# Patient Record
Sex: Female | Born: 1990 | Race: White | Hispanic: No | Marital: Married | Smoking: Never smoker
Health system: Southern US, Community
[De-identification: ages and names within clinical notes are randomized; demographics above are authoritative.]

## PROBLEM LIST (undated history)

## (undated) ENCOUNTER — Inpatient Hospital Stay: Payer: Self-pay

## (undated) DIAGNOSIS — N200 Calculus of kidney: Secondary | ICD-10-CM

## (undated) DIAGNOSIS — K449 Diaphragmatic hernia without obstruction or gangrene: Secondary | ICD-10-CM

## (undated) DIAGNOSIS — R1013 Epigastric pain: Secondary | ICD-10-CM

## (undated) DIAGNOSIS — L709 Acne, unspecified: Secondary | ICD-10-CM

## (undated) DIAGNOSIS — N9089 Other specified noninflammatory disorders of vulva and perineum: Secondary | ICD-10-CM

## (undated) DIAGNOSIS — K2210 Ulcer of esophagus without bleeding: Secondary | ICD-10-CM

## (undated) DIAGNOSIS — K219 Gastro-esophageal reflux disease without esophagitis: Secondary | ICD-10-CM

## (undated) DIAGNOSIS — G43909 Migraine, unspecified, not intractable, without status migrainosus: Secondary | ICD-10-CM

## (undated) DIAGNOSIS — F419 Anxiety disorder, unspecified: Secondary | ICD-10-CM

## (undated) DIAGNOSIS — R0789 Other chest pain: Secondary | ICD-10-CM

## (undated) DIAGNOSIS — Z87442 Personal history of urinary calculi: Secondary | ICD-10-CM

## (undated) HISTORY — PX: OTHER SURGICAL HISTORY: SHX169

## (undated) HISTORY — DX: Ulcer of esophagus without bleeding: K22.10

## (undated) HISTORY — DX: Acne, unspecified: L70.9

## (undated) HISTORY — DX: Other chest pain: R07.89

## (undated) HISTORY — DX: Anxiety disorder, unspecified: F41.9

## (undated) HISTORY — DX: Calculus of kidney: N20.0

## (undated) HISTORY — DX: Epigastric pain: R10.13

## (undated) HISTORY — DX: Other specified noninflammatory disorders of vulva and perineum: N90.89

## (undated) HISTORY — DX: Diaphragmatic hernia without obstruction or gangrene: K44.9

---

## 2014-06-01 ENCOUNTER — Ambulatory Visit: Payer: Self-pay | Admitting: Internal Medicine

## 2014-06-02 ENCOUNTER — Ambulatory Visit: Payer: Self-pay | Admitting: Internal Medicine

## 2014-06-02 LAB — CBC WITH DIFFERENTIAL/PLATELET
Basophil #: 0 10*3/uL (ref 0.0–0.1)
Basophil %: 0.7 %
Eosinophil #: 0.1 10*3/uL (ref 0.0–0.7)
Eosinophil %: 1 %
HCT: 40.6 % (ref 35.0–47.0)
HGB: 13.6 g/dL (ref 12.0–16.0)
Lymphocyte #: 1.7 10*3/uL (ref 1.0–3.6)
Lymphocyte %: 29.8 %
MCH: 29.1 pg (ref 26.0–34.0)
MCHC: 33.6 g/dL (ref 32.0–36.0)
MCV: 87 fL (ref 80–100)
Monocyte #: 0.3 x10 3/mm (ref 0.2–0.9)
Monocyte %: 4.5 %
Neutrophil #: 3.7 10*3/uL (ref 1.4–6.5)
Neutrophil %: 64 %
Platelet: 211 10*3/uL (ref 150–440)
RBC: 4.69 10*6/uL (ref 3.80–5.20)
RDW: 12.3 % (ref 11.5–14.5)
WBC: 5.8 10*3/uL (ref 3.6–11.0)

## 2014-06-02 LAB — URINALYSIS, COMPLETE
Bilirubin,UR: NEGATIVE
Glucose,UR: NEGATIVE
Leukocyte Esterase: NEGATIVE
Nitrite: NEGATIVE
Ph: 6 (ref 5.0–8.0)
Protein: NEGATIVE
Specific Gravity: >=1.03 (ref 1.000–1.030)

## 2014-06-02 LAB — COMPREHENSIVE METABOLIC PANEL
Albumin: 3.4 g/dL (ref 3.4–5.0)
Alkaline Phosphatase: 71 U/L
Anion Gap: 10 (ref 7–16)
BUN: 12 mg/dL (ref 7–18)
Bilirubin,Total: 0.4 mg/dL (ref 0.2–1.0)
Calcium, Total: 9.1 mg/dL (ref 8.5–10.1)
Chloride: 103 mmol/L (ref 98–107)
Co2: 25 mmol/L (ref 21–32)
Creatinine: 0.63 mg/dL (ref 0.60–1.30)
EGFR (African American): >60
EGFR (Non-African Amer.): >60
Glucose: 102 mg/dL — ABNORMAL HIGH (ref 65–99)
Osmolality: 276 (ref 275–301)
Potassium: 3.8 mmol/L (ref 3.5–5.1)
SGOT(AST): 16 U/L (ref 15–37)
SGPT (ALT): 17 U/L
Sodium: 138 mmol/L (ref 136–145)
Total Protein: 7.7 g/dL (ref 6.4–8.2)

## 2014-06-03 ENCOUNTER — Emergency Department: Payer: Self-pay | Admitting: Emergency Medicine

## 2014-06-03 LAB — CBC WITH DIFFERENTIAL/PLATELET
Basophil #: 0 10*3/uL (ref 0.0–0.1)
Basophil %: 0.3 %
Eosinophil #: 0.1 10*3/uL (ref 0.0–0.7)
Eosinophil %: 0.6 %
HCT: 41.4 % (ref 35.0–47.0)
HGB: 13.7 g/dL (ref 12.0–16.0)
Lymphocyte #: 2.3 10*3/uL (ref 1.0–3.6)
Lymphocyte %: 28.8 %
MCH: 29.4 pg (ref 26.0–34.0)
MCHC: 33.2 g/dL (ref 32.0–36.0)
MCV: 89 fL (ref 80–100)
Monocyte #: 0.4 x10 3/mm (ref 0.2–0.9)
Monocyte %: 5 %
Neutrophil #: 5.1 10*3/uL (ref 1.4–6.5)
Neutrophil %: 65.3 %
Platelet: 232 10*3/uL (ref 150–440)
RBC: 4.66 10*6/uL (ref 3.80–5.20)
RDW: 12.5 % (ref 11.5–14.5)
WBC: 7.9 10*3/uL (ref 3.6–11.0)

## 2014-06-03 LAB — COMPREHENSIVE METABOLIC PANEL
Albumin: 3.4 g/dL (ref 3.4–5.0)
Alkaline Phosphatase: 73 U/L
Anion Gap: 4 — ABNORMAL LOW (ref 7–16)
BUN: 12 mg/dL (ref 7–18)
Bilirubin,Total: 0.3 mg/dL (ref 0.2–1.0)
Calcium, Total: 8.6 mg/dL (ref 8.5–10.1)
Chloride: 107 mmol/L (ref 98–107)
Co2: 29 mmol/L (ref 21–32)
Creatinine: 0.66 mg/dL (ref 0.60–1.30)
EGFR (African American): >60
EGFR (Non-African Amer.): >60
Glucose: 105 mg/dL — ABNORMAL HIGH (ref 65–99)
Osmolality: 280 (ref 275–301)
Potassium: 3.8 mmol/L (ref 3.5–5.1)
SGOT(AST): 25 U/L (ref 15–37)
SGPT (ALT): 22 U/L
Sodium: 140 mmol/L (ref 136–145)
Total Protein: 7.7 g/dL (ref 6.4–8.2)

## 2014-06-03 LAB — URINALYSIS, COMPLETE
Bilirubin,UR: NEGATIVE
Glucose,UR: NEGATIVE mg/dL (ref 0–75)
Leukocyte Esterase: NEGATIVE
Nitrite: NEGATIVE
Ph: 5 (ref 4.5–8.0)
Protein: NEGATIVE
RBC,UR: 6 /HPF (ref 0–5)
Specific Gravity: 1.023 (ref 1.003–1.030)
Squamous Epithelial: 7
WBC UR: NONE SEEN /HPF (ref 0–5)

## 2014-06-04 LAB — URINE CULTURE

## 2014-10-17 ENCOUNTER — Other Ambulatory Visit: Payer: Self-pay | Admitting: Oncology

## 2014-11-06 ENCOUNTER — Encounter: Payer: Self-pay | Admitting: Obstetrics and Gynecology

## 2014-12-04 ENCOUNTER — Telehealth: Payer: Self-pay | Admitting: Obstetrics and Gynecology

## 2014-12-04 NOTE — Telephone Encounter (Signed)
PT CALLED AND WOULD LIKE A CALL BACK SHE HAS SOME CONCERNS ABOUT EHR PERIOD.

## 2014-12-06 NOTE — Telephone Encounter (Signed)
Returned call- no menses since May 24th, has stopped OCPs as of April; some cramping last night, negative UPT 6 days ago; trying for pregnancy.  Instructed to do UPT in 4 days if no menses and let me know results. Will bring in for blood test.

## 2014-12-11 ENCOUNTER — Other Ambulatory Visit: Payer: 59

## 2014-12-11 ENCOUNTER — Other Ambulatory Visit: Payer: Self-pay | Admitting: *Deleted

## 2014-12-11 DIAGNOSIS — N926 Irregular menstruation, unspecified: Secondary | ICD-10-CM

## 2014-12-13 ENCOUNTER — Telehealth: Payer: Self-pay | Admitting: Obstetrics and Gynecology

## 2014-12-13 LAB — BETA HCG QUANT (REF LAB): hCG Quant: <1 m[IU]/mL

## 2014-12-13 LAB — SPECIMEN STATUS REPORT

## 2014-12-13 NOTE — Telephone Encounter (Signed)
Pt notified that bhcg results were negative. Will wait to hear what her next step is next week as in [facility] is out of office until Tuesday.

## 2014-12-13 NOTE — Telephone Encounter (Signed)
Pt called yesterday and her labs were not back and I didn't see that they are back today so far, is there any way we can check thru [facility], pt is very anxious about wanting to know the results.

## 2014-12-17 ENCOUNTER — Other Ambulatory Visit: Payer: Self-pay | Admitting: Obstetrics and Gynecology

## 2014-12-17 DIAGNOSIS — N926 Irregular menstruation, unspecified: Secondary | ICD-10-CM

## 2014-12-17 MED ORDER — MEDROXYPROGESTERONE ACETATE 10 MG PO TABS: 10.0000 mg | ORAL_TABLET | Freq: Every day | ORAL | Status: DC

## 2014-12-17 NOTE — Telephone Encounter (Signed)
pls advise

## 2014-12-17 NOTE — Telephone Encounter (Signed)
Please let her know i put order in for pelvic ultrasound- want to look at her uterus and ovaries to see if looks like ovulation is occuring, would like to do ultrasound within next 2 weeks, and to see me afterwards, also sent ina prescription to try to start period again- to take 1 pill at bedtime for 10 days.

## 2014-12-17 NOTE — Telephone Encounter (Signed)
i put the order in already- do yu see it?

## 2014-12-17 NOTE — Telephone Encounter (Signed)
Hey mel put order in can you make appt and let me know Thanks a bunch

## 2014-12-18 NOTE — Telephone Encounter (Signed)
appt was made for [place] and medication was faxed to [facility]

## 2015-01-01 ENCOUNTER — Encounter: Payer: Self-pay | Admitting: Obstetrics and Gynecology

## 2015-01-01 ENCOUNTER — Ambulatory Visit: Payer: 59

## 2015-01-01 ENCOUNTER — Ambulatory Visit (INDEPENDENT_AMBULATORY_CARE_PROVIDER_SITE_OTHER): Payer: 59 | Admitting: Obstetrics and Gynecology

## 2015-01-01 VITALS — BP 126/70 | HR 78 | Ht 67.0 in | Wt 155.8 lb

## 2015-01-01 DIAGNOSIS — N926 Irregular menstruation, unspecified: Secondary | ICD-10-CM

## 2015-01-01 NOTE — Progress Notes (Signed)
Patient ID: Evelyn Caldwell, female   DOB: [birth year], 24 y.o.   MRN: [id number] S; here to review ultrasound for missed menses after stopping OCPs x 3 months; reports menses started after taking provera with LMP 12/25/14. Previous menses 10/22/14.  O: A&O x4. Well groomed female in no distress Pelvic ultrasound shows normal uterus and ovaries- see scan in computer.  A: Irregular menses, 3 months off OCPs  P; will continue with expectant management, and will notify me if no menses by day 35 with negative UPT. To continue PNV as they desire pregnancy.  RTC prn or in 6 months if not pregnanct bu then.  Evelyn Caldwell Evelyn Caldwell, CNM

## 2015-01-24 ENCOUNTER — Ambulatory Visit (INDEPENDENT_AMBULATORY_CARE_PROVIDER_SITE_OTHER): Payer: 59 | Admitting: Family Medicine

## 2015-01-24 ENCOUNTER — Encounter: Payer: Self-pay | Admitting: Family Medicine

## 2015-01-24 VITALS — BP 134/72 | HR 102 | Temp 98.3°F | Resp 14 | Wt 155.0 lb

## 2015-01-24 DIAGNOSIS — R1011 Right upper quadrant pain: Secondary | ICD-10-CM

## 2015-01-24 DIAGNOSIS — G43909 Migraine, unspecified, not intractable, without status migrainosus: Secondary | ICD-10-CM | POA: Insufficient documentation

## 2015-01-24 HISTORY — DX: Migraine, unspecified, not intractable, without status migrainosus: G43.909

## 2015-01-24 MED ORDER — HYDROCODONE-ACETAMINOPHEN 5-325 MG PO TABS: ORAL_TABLET | ORAL | Status: DC

## 2015-01-24 NOTE — Patient Instructions (Signed)
Avoid fatty meals and take pain medication as needed. If you develop worsening pain over the weekend or high fever report to the ER.

## 2015-01-24 NOTE — Progress Notes (Signed)
Subjective:     Patient ID: Evelyn Caldwell, female   DOB: Dec 22, 1990, 24 y.o.   MRN: [id number]  HPI  Chief Complaint  Patient presents with  . Fatigue    patient states she developed lower back pain on Tuesday August 23rd and then started to feel achy all over and progressed later in the day with developing right pain. Pain is located now in RUQ and top abdomen, worse at night with laying down. Nothing makes it feel better. Nausea has been present. No fever, vomiting, or bloating that she could tell. She has had some chills. Patient states her and her husband are trying to have a baby but so far as of this Monday AUgust 22nd test was negative.  Reports chills and mild worsening of symptoms after eating. Minimal appetite over the last few days.   Review of Systems  Constitutional: Positive for chills.  Gastrointestinal:       Formed stool yesterday, diarrhea today.  Genitourinary: Negative for dysuria.       Has had recent gyn evaluation with negative pregnancy test. She reports she also had a negative home pregnancy test at onset of sx.       Objective:   Physical Exam  Constitutional: She appears well-developed and well-nourished. No distress.  Pulmonary/Chest: Breath sounds normal.  Abdominal: Soft. Bowel sounds are normal. There is tenderness (right upper quadrant to epigastric area. Negative Murphy's sign).       Assessment:    1. Right upper quadrant pain - CBC with Differential/Platelet - Comprehensive metabolic panel - Lipase - US Abdomen Complete; Future - HYDROcodone-acetaminophen (NORCO/VICODIN) 5-325 MG per tablet; One every 4-6 hours as needed for pain  Dispense: 28 tablet; Refill: 0    Plan:    Further f/u pending lab and u/s results.

## 2015-01-25 LAB — CBC WITH DIFFERENTIAL/PLATELET
Basophils Absolute: 0 10*3/uL (ref 0.0–0.2)
Basos: 0 %
EOS (ABSOLUTE): 0 10*3/uL (ref 0.0–0.4)
Eos: 0 %
Hematocrit: 40.3 % (ref 34.0–46.6)
Hemoglobin: 14 g/dL (ref 11.1–15.9)
Immature Grans (Abs): 0 10*3/uL (ref 0.0–0.1)
Immature Granulocytes: 0 %
Lymphocytes Absolute: 1.1 10*3/uL (ref 0.7–3.1)
Lymphs: 30 %
MCH: 29 pg (ref 26.6–33.0)
MCHC: 34.7 g/dL (ref 31.5–35.7)
MCV: 83 fL (ref 79–97)
Monocytes Absolute: 0.3 10*3/uL (ref 0.1–0.9)
Monocytes: 9 %
Neutrophils Absolute: 2.1 10*3/uL (ref 1.4–7.0)
Neutrophils: 61 %
Platelets: 154 10*3/uL (ref 150–379)
RBC: 4.83 x10E6/uL (ref 3.77–5.28)
RDW: 13.3 % (ref 12.3–15.4)
WBC: 3.5 10*3/uL (ref 3.4–10.8)

## 2015-01-25 LAB — COMPREHENSIVE METABOLIC PANEL
ALT: 24 IU/L (ref 0–32)
AST: 23 IU/L (ref 0–40)
Albumin/Globulin Ratio: 1.6 (ref 1.1–2.5)
Albumin: 4.1 g/dL (ref 3.5–5.5)
Alkaline Phosphatase: 78 IU/L (ref 39–117)
BUN/Creatinine Ratio: 22 — ABNORMAL HIGH (ref 8–20)
BUN: 13 mg/dL (ref 6–20)
Bilirubin Total: 0.4 mg/dL (ref 0.0–1.2)
CO2: 22 mmol/L (ref 18–29)
Calcium: 8.7 mg/dL (ref 8.7–10.2)
Chloride: 101 mmol/L (ref 97–108)
Creatinine, Ser: 0.58 mg/dL (ref 0.57–1.00)
GFR calc Af Amer: 148 mL/min/{1.73_m2} (ref >59)
GFR calc non Af Amer: 129 mL/min/{1.73_m2} (ref >59)
Globulin, Total: 2.6 g/dL (ref 1.5–4.5)
Glucose: 95 mg/dL (ref 65–99)
Potassium: 3.9 mmol/L (ref 3.5–5.2)
Sodium: 139 mmol/L (ref 134–144)
Total Protein: 6.7 g/dL (ref 6.0–8.5)

## 2015-01-25 LAB — LIPASE: Lipase: 26 U/L (ref 0–59)

## 2015-01-28 ENCOUNTER — Telehealth: Payer: Self-pay

## 2015-01-28 ENCOUNTER — Ambulatory Visit (HOSPITAL_COMMUNITY)
Admission: RE | Admit: 2015-01-28 | Discharge: 2015-01-28 | Disposition: A | Discharge status: Home / Self Care | Payer: 59 | Source: Ambulatory Visit | Attending: Family Medicine | Admitting: Family Medicine

## 2015-01-28 DIAGNOSIS — R110 Nausea: Secondary | ICD-10-CM | POA: Diagnosis not present

## 2015-01-28 DIAGNOSIS — R1013 Epigastric pain: Secondary | ICD-10-CM | POA: Insufficient documentation

## 2015-01-28 DIAGNOSIS — R1011 Right upper quadrant pain: Secondary | ICD-10-CM | POA: Insufficient documentation

## 2015-01-28 NOTE — Telephone Encounter (Signed)
Patient has been advised, she reports that she is feeling better. KW

## 2015-01-28 NOTE — Telephone Encounter (Signed)
-----   Message from Carmon Ginsberg, [place] sent at 01/28/2015 10:42 AM EDT ----- Normal ultrasound. If your are better do not need to do further evaluation.

## 2015-01-28 NOTE — Telephone Encounter (Signed)
[id number] 

## 2015-01-31 ENCOUNTER — Ambulatory Visit: Payer: Self-pay

## 2015-04-10 ENCOUNTER — Telehealth: Payer: Self-pay | Admitting: Obstetrics and Gynecology

## 2015-04-10 NOTE — Telephone Encounter (Signed)
Patient called. She stated she has still not started her period and has taken 2 pregnancy tests both negative.

## 2015-04-11 NOTE — Telephone Encounter (Signed)
Called pt she hasnt had a period since 03/04/15, she wanted to know what to do??? UPT x 2 neg

## 2015-04-11 NOTE — Telephone Encounter (Signed)
Is she having any other s/s of pregnancy? Have her come in for blood test

## 2015-04-14 NOTE — Telephone Encounter (Signed)
Left detailed message for pt 

## 2015-05-06 ENCOUNTER — Other Ambulatory Visit: Payer: Self-pay | Admitting: Obstetrics and Gynecology

## 2015-05-06 ENCOUNTER — Telehealth: Payer: Self-pay | Admitting: Obstetrics and Gynecology

## 2015-05-06 MED ORDER — CLOMIPHENE CITRATE 50 MG PO TABS: 50.0000 mg | ORAL_TABLET | Freq: Every day | ORAL | Status: DC

## 2015-05-06 NOTE — Telephone Encounter (Signed)
Notified pt she voiced understanding 

## 2015-05-06 NOTE — Telephone Encounter (Signed)
PT WAS HERE A WHILE BACK AND SHE WAS TOLD THAT IF SHE WANTED TO START THE CLOMIDE TO CALL IN AND SHE IS READY. PHARMACY [facility].

## 2015-05-06 NOTE — Telephone Encounter (Signed)
Please let her know I sent it in, to take 1st tablet on day 5 of next cycle through day 9.  One daily at bedtime.

## 2015-06-01 NOTE — L&D Delivery Note (Signed)
Delivery Note At  1646 a viable and healthy female "Evelyn Caldwell" was delivered via  (Presentation:LOA ;  ).  APGAR:7, 9  .   Placenta status: delivered intact with 3 vessel  Cord:  with the following complications: fetal decels in second stage- unknown etiology, vacuum applied at outlet with 2 pop-offs and four pulls total.  Anesthesia:  epidural Episiotomy:  none Lacerations:  2nd degree Suture Repair: 3.0 vicryl rapide Est. Blood Loss (mL):  350  Mom to postpartum.  Baby to Couplet care / Skin to Skin.  Whitnee Orzel [employer], CNM 03/09/2016, 5:10 PM

## 2015-07-03 ENCOUNTER — Encounter: Payer: Self-pay | Admitting: Obstetrics and Gynecology

## 2015-07-03 ENCOUNTER — Ambulatory Visit (INDEPENDENT_AMBULATORY_CARE_PROVIDER_SITE_OTHER): Payer: PRIVATE HEALTH INSURANCE | Admitting: Obstetrics and Gynecology

## 2015-07-03 VITALS — BP 135/70 | HR 107 | Wt 174.8 lb

## 2015-07-03 DIAGNOSIS — N926 Irregular menstruation, unspecified: Secondary | ICD-10-CM | POA: Diagnosis not present

## 2015-07-03 NOTE — Progress Notes (Signed)
Patient ID: Evelyn Caldwell, female   DOB: 03/04/1991, 25 y.o.   MRN: [id number] Here for pregnancy confirmation, happy about clomid achieved pregnancy.  UPT+ here.  Will RTC in 2 weeks for viability scan and nurse intake.  Quant Bhcg obtained today.  Jo-Ann Johanning [place], CNM

## 2015-07-04 LAB — BETA HCG QUANT (REF LAB): hCG Quant: 4362 m[IU]/mL

## 2015-07-11 ENCOUNTER — Ambulatory Visit (INDEPENDENT_AMBULATORY_CARE_PROVIDER_SITE_OTHER): Payer: PRIVATE HEALTH INSURANCE

## 2015-07-11 DIAGNOSIS — N926 Irregular menstruation, unspecified: Secondary | ICD-10-CM

## 2015-07-29 ENCOUNTER — Ambulatory Visit (INDEPENDENT_AMBULATORY_CARE_PROVIDER_SITE_OTHER): Payer: PRIVATE HEALTH INSURANCE | Admitting: Obstetrics and Gynecology

## 2015-07-29 VITALS — BP 128/66 | HR 81 | Wt 177.1 lb

## 2015-07-29 DIAGNOSIS — R638 Other symptoms and signs concerning food and fluid intake: Secondary | ICD-10-CM

## 2015-07-29 DIAGNOSIS — Z3490 Encounter for supervision of normal pregnancy, unspecified, unspecified trimester: Secondary | ICD-10-CM

## 2015-07-29 DIAGNOSIS — Z113 Encounter for screening for infections with a predominantly sexual mode of transmission: Secondary | ICD-10-CM

## 2015-07-29 DIAGNOSIS — Z1389 Encounter for screening for other disorder: Secondary | ICD-10-CM

## 2015-07-29 DIAGNOSIS — Z369 Encounter for antenatal screening, unspecified: Secondary | ICD-10-CM

## 2015-07-29 DIAGNOSIS — Z36 Encounter for antenatal screening of mother: Secondary | ICD-10-CM

## 2015-07-29 DIAGNOSIS — Z331 Pregnant state, incidental: Secondary | ICD-10-CM

## 2015-07-29 NOTE — Progress Notes (Signed)
Evelyn Caldwell presents for [employer] nurse interview visit. G-1.  P-0. Pregnancy education material explained and given. No cats in the home. NOB labs ordered. TSH/HbgA1c due to Increased BMI.  HIV labs and Drug screen were explained optional and she could opt out of tests but did not decline. Drug screen ordered. PNV encouraged. NT to discuss with provider. Pt. To follow up with provider in 3 weeks for NOB physical.  All questions answered.   ZIKA EXPOSURE SCREEN:  The patient has not traveled to a [place] Virus endemic area within the past 6 months, nor has she had unprotected sex with a partner who has travelled to a [place] endemic region within the past 6 months. The patient has been advised to notify us if these factors change any time during this current pregnancy, so adequate testing and monitoring can be initiated.

## 2015-07-30 LAB — URINALYSIS, ROUTINE W REFLEX MICROSCOPIC
Bilirubin, UA: NEGATIVE
Glucose, UA: NEGATIVE
Ketones, UA: NEGATIVE
Leukocytes, UA: NEGATIVE
Nitrite, UA: NEGATIVE
Protein, UA: NEGATIVE
Specific Gravity, UA: 1.029 (ref 1.005–1.030)
Urobilinogen, Ur: 0.2 mg/dL (ref 0.2–1.0)
pH, UA: 6 (ref 5.0–7.5)

## 2015-07-30 LAB — PAIN MGT SCRN (14 DRUGS), UR
Amphetamine Screen, Ur: NEGATIVE ng/mL
Barbiturate Screen, Ur: NEGATIVE ng/mL
Benzodiazepine Screen, Urine: NEGATIVE ng/mL
Buprenorphine, Urine: NEGATIVE ng/mL
Cannabinoids Ur Ql Scn: NEGATIVE ng/mL
Cocaine(Metab.)Screen, Urine: NEGATIVE ng/mL
Creatinine(Crt), U: 148.3 mg/dL (ref 20.0–300.0)
Fentanyl, Urine: NEGATIVE pg/mL
Meperidine Screen, Urine: NEGATIVE ng/mL
Methadone Scn, Ur: NEGATIVE ng/mL
Opiate Scrn, Ur: NEGATIVE ng/mL
Oxycodone+Oxymorphone Ur Ql Scn: NEGATIVE ng/mL
PCP Scrn, Ur: NEGATIVE ng/mL
Ph of Urine: 5.5 (ref 4.5–8.9)
Propoxyphene, Screen: NEGATIVE ng/mL
Tramadol Ur Ql Scn: NEGATIVE ng/mL

## 2015-07-30 LAB — MICROSCOPIC EXAMINATION
Casts: NONE SEEN /lpf
WBC, UA: NONE SEEN /hpf (ref 0–?)

## 2015-07-30 LAB — CBC WITH DIFFERENTIAL/PLATELET
Basophils Absolute: 0 10*3/uL (ref 0.0–0.2)
Basos: 0 %
EOS (ABSOLUTE): 0.1 10*3/uL (ref 0.0–0.4)
Eos: 1 %
Hematocrit: 34.7 % (ref 34.0–46.6)
Hemoglobin: 11.8 g/dL (ref 11.1–15.9)
Immature Grans (Abs): 0 10*3/uL (ref 0.0–0.1)
Immature Granulocytes: 0 %
Lymphocytes Absolute: 1.9 10*3/uL (ref 0.7–3.1)
Lymphs: 22 %
MCH: 28.4 pg (ref 26.6–33.0)
MCHC: 34 g/dL (ref 31.5–35.7)
MCV: 83 fL (ref 79–97)
Monocytes Absolute: 0.5 10*3/uL (ref 0.1–0.9)
Monocytes: 6 %
Neutrophils Absolute: 6 10*3/uL (ref 1.4–7.0)
Neutrophils: 71 %
Platelets: 243 10*3/uL (ref 150–379)
RBC: 4.16 x10E6/uL (ref 3.77–5.28)
RDW: 13.9 % (ref 12.3–15.4)
WBC: 8.4 10*3/uL (ref 3.4–10.8)

## 2015-07-30 LAB — VARICELLA ZOSTER ANTIBODY, IGM: Varicella IgM: <0.91 index (ref 0.00–0.90)

## 2015-07-30 LAB — GC/CHLAMYDIA PROBE AMP
Chlamydia trachomatis, NAA: NEGATIVE
Neisseria gonorrhoeae by PCR: NEGATIVE

## 2015-07-30 LAB — HEMOGLOBIN A1C
Est. average glucose Bld gHb Est-mCnc: 97 mg/dL
Hgb A1c MFr Bld: 5 % (ref 4.8–5.6)

## 2015-07-30 LAB — NICOTINE SCREEN, URINE: Cotinine Ql Scrn, Ur: NEGATIVE ng/mL

## 2015-07-30 LAB — RUBELLA ANTIBODY, IGM: Rubella IgM: <20 AU/mL (ref 0.0–19.9)

## 2015-07-30 LAB — RH TYPE: Rh Factor: POSITIVE

## 2015-07-30 LAB — RPR: RPR Ser Ql: NONREACTIVE

## 2015-07-30 LAB — ABO

## 2015-07-30 LAB — TSH: TSH: 1.73 u[IU]/mL (ref 0.450–4.500)

## 2015-07-30 LAB — ANTIBODY SCREEN: Antibody Screen: NEGATIVE

## 2015-07-30 LAB — HEPATITIS B SURFACE ANTIGEN: Hepatitis B Surface Ag: NEGATIVE

## 2015-07-30 LAB — HIV ANTIBODY (ROUTINE TESTING W REFLEX): HIV Screen 4th Generation wRfx: NONREACTIVE

## 2015-07-31 ENCOUNTER — Other Ambulatory Visit: Payer: Self-pay | Admitting: Obstetrics and Gynecology

## 2015-07-31 DIAGNOSIS — Z283 Underimmunization status: Secondary | ICD-10-CM

## 2015-07-31 DIAGNOSIS — O9989 Other specified diseases and conditions complicating pregnancy, childbirth and the puerperium: Principal | ICD-10-CM

## 2015-07-31 DIAGNOSIS — O99891 Other specified diseases and conditions complicating pregnancy: Secondary | ICD-10-CM

## 2015-07-31 DIAGNOSIS — Z2839 Other underimmunization status: Secondary | ICD-10-CM

## 2015-07-31 DIAGNOSIS — O09899 Supervision of other high risk pregnancies, unspecified trimester: Secondary | ICD-10-CM | POA: Insufficient documentation

## 2015-07-31 LAB — CULTURE, OB URINE

## 2015-07-31 LAB — URINE CULTURE, OB REFLEX

## 2015-08-08 ENCOUNTER — Other Ambulatory Visit: Payer: Self-pay | Admitting: Obstetrics and Gynecology

## 2015-08-08 DIAGNOSIS — O3680X0 Pregnancy with inconclusive fetal viability, not applicable or unspecified: Secondary | ICD-10-CM

## 2015-08-13 ENCOUNTER — Other Ambulatory Visit: Payer: Self-pay | Admitting: Obstetrics and Gynecology

## 2015-08-13 ENCOUNTER — Ambulatory Visit (INDEPENDENT_AMBULATORY_CARE_PROVIDER_SITE_OTHER): Payer: PRIVATE HEALTH INSURANCE

## 2015-08-13 DIAGNOSIS — O3680X0 Pregnancy with inconclusive fetal viability, not applicable or unspecified: Secondary | ICD-10-CM

## 2015-08-13 DIAGNOSIS — Z3682 Encounter for antenatal screening for nuchal translucency: Secondary | ICD-10-CM

## 2015-08-13 DIAGNOSIS — O283 Abnormal ultrasonic finding on antenatal screening of mother: Secondary | ICD-10-CM

## 2015-08-19 ENCOUNTER — Encounter: Payer: Self-pay | Admitting: Obstetrics and Gynecology

## 2015-08-19 LAB — FIRST TRIMESTER SCREEN W/NT
CRL: 47.7 mm
DIA MoM: 1.05
DIA Value: 280.2 pg/mL
Gest Age-Collect: 11.6 weeks
Maternal Age At EDD: 26.3 years
Nuchal Translucency MoM: 0.76
Nuchal Translucency: 1 mm
Number of Fetuses: 1
PAPP-A MoM: 0.97
PAPP-A Value: 521.7 ng/mL
PDF: 0
Test Results:: NEGATIVE
Weight: 177 [lb_av]
hCG MoM: 1.16
hCG Value: 112.9 IU/mL

## 2015-08-29 ENCOUNTER — Encounter: Payer: PRIVATE HEALTH INSURANCE | Admitting: Obstetrics and Gynecology

## 2015-09-03 ENCOUNTER — Encounter: Payer: Self-pay | Admitting: Obstetrics and Gynecology

## 2015-09-03 ENCOUNTER — Ambulatory Visit (INDEPENDENT_AMBULATORY_CARE_PROVIDER_SITE_OTHER): Payer: PRIVATE HEALTH INSURANCE | Admitting: Obstetrics and Gynecology

## 2015-09-03 VITALS — BP 111/64 | HR 93 | Wt 176.1 lb

## 2015-09-03 DIAGNOSIS — Z2839 Other underimmunization status: Secondary | ICD-10-CM

## 2015-09-03 DIAGNOSIS — O99891 Other specified diseases and conditions complicating pregnancy: Secondary | ICD-10-CM

## 2015-09-03 DIAGNOSIS — Z789 Other specified health status: Secondary | ICD-10-CM

## 2015-09-03 DIAGNOSIS — Z331 Pregnant state, incidental: Secondary | ICD-10-CM

## 2015-09-03 DIAGNOSIS — O09899 Supervision of other high risk pregnancies, unspecified trimester: Secondary | ICD-10-CM

## 2015-09-03 DIAGNOSIS — O9989 Other specified diseases and conditions complicating pregnancy, childbirth and the puerperium: Secondary | ICD-10-CM

## 2015-09-03 DIAGNOSIS — Z283 Underimmunization status: Secondary | ICD-10-CM

## 2015-09-03 LAB — POCT URINALYSIS DIPSTICK
Bilirubin, UA: NEGATIVE
Blood, UA: NEGATIVE
Glucose, UA: NEGATIVE
Ketones, UA: NEGATIVE
Leukocytes, UA: NEGATIVE
Nitrite, UA: NEGATIVE
Spec Grav, UA: 1.01
Urobilinogen, UA: 0.2
pH, UA: 7.5

## 2015-09-03 NOTE — Progress Notes (Signed)
NEW OB HISTORY AND PHYSICAL  SUBJECTIVE:       Evelyn Caldwell is a 25 y.o. G1P0 female, Patient's last menstrual period was 05/25/2015 (exact date)., Estimated Date of Delivery: 02/29/16, [redacted]w[redacted]d, presents today for establishment of Prenatal Care. She has no unusual complaints and complains of headache      Gynecologic History Patient's last menstrual period was 05/25/2015 (exact date). Normal Contraception: none Last Pap: 2016. Results were: normal  Obstetric History OB History  Gravida Para Term Preterm AB SAB TAB Ectopic Multiple Living  1             # Outcome Date GA Lbr Len/2nd Weight Sex Delivery Anes PTL Lv  1 Current               Past Medical History  Diagnosis Date  . Vulvar lesion     History reviewed. No pertinent past surgical history.  Current Outpatient Prescriptions on File Prior to Visit  Medication Sig Dispense Refill  . Prenatal Vit-Fe Fumarate-FA (PRENATAL MULTIVITAMIN) TABS tablet Take 1 tablet by mouth daily at 12 noon.     No current facility-administered medications on file prior to visit.    No Known Allergies  Social History   Social History  . Marital Status: Married    Spouse Name: N/A  . Number of Children: N/A  . Years of Education: N/A   Occupational History  . Not on file.   Social History Main Topics  . Smoking status: Never Smoker   . Smokeless tobacco: Never Used  . Alcohol Use: Yes     Comment: occasionally  . Drug Use: No  . Sexual Activity: Yes    Birth Control/ Protection: None   Other Topics Concern  . Not on file   Social History Narrative    Family History  Problem Relation Age of Onset  . Congestive Heart Failure Maternal Grandfather   . Parkinson's disease Paternal Grandmother     The following portions of the patient's history were reviewed and updated as appropriate: allergies, current medications, past OB history, past medical history, past surgical history, past family history, past social  history, and problem list.    OBJECTIVE: Initial Physical Exam (New OB)  GENERAL APPEARANCE: alert, well appearing, in no apparent distress, oriented to person, place and time HEAD: normocephalic, atraumatic MOUTH: mucous membranes moist, pharynx normal without lesions THYROID: no thyromegaly or masses present BREASTS: not examined LUNGS: clear to auscultation, no wheezes, rales or rhonchi, symmetric air entry HEART: regular rate and rhythm, no murmurs ABDOMEN: soft, nontender, nondistended, no abnormal masses, no epigastric pain, fundus not palpable and FHT present EXTREMITIES: no redness or tenderness in the calves or thighs SKIN: normal coloration and turgor, no rashes LYMPH NODES: no adenopathy palpable NEUROLOGIC: alert, oriented, normal speech, no focal findings or movement disorder noted  PELVIC EXAM not examined  ASSESSMENT: Normal pregnancy Seasonal allergies- OK to add claritin prn  PLAN: Prenatal care See orders

## 2015-09-03 NOTE — Progress Notes (Signed)
NOB physical- c/o fatigue, no other symptons

## 2015-09-03 NOTE — Patient Instructions (Signed)

## 2015-10-01 ENCOUNTER — Encounter: Payer: Self-pay | Admitting: Obstetrics and Gynecology

## 2015-10-01 ENCOUNTER — Ambulatory Visit (INDEPENDENT_AMBULATORY_CARE_PROVIDER_SITE_OTHER): Payer: PRIVATE HEALTH INSURANCE | Admitting: Obstetrics and Gynecology

## 2015-10-01 VITALS — BP 142/66 | HR 90 | Wt 178.9 lb

## 2015-10-01 DIAGNOSIS — Z331 Pregnant state, incidental: Secondary | ICD-10-CM

## 2015-10-01 LAB — POCT URINALYSIS DIPSTICK
Bilirubin, UA: NEGATIVE
Blood, UA: NEGATIVE
Glucose, UA: NEGATIVE
Ketones, UA: NEGATIVE
Leukocytes, UA: NEGATIVE
Nitrite, UA: NEGATIVE
Protein, UA: NEGATIVE
Spec Grav, UA: 1.015
Urobilinogen, UA: 0.2
pH, UA: 6

## 2015-10-01 NOTE — Progress Notes (Signed)
ROB- pt is doing well, she is having some "clear fluid coming out of her belly button"

## 2015-10-01 NOTE — Progress Notes (Signed)
ROB- collection of skin and hair in belly button- cleaned out- no signs of infection- anatomy scan planned for next week

## 2015-10-09 ENCOUNTER — Other Ambulatory Visit: Payer: PRIVATE HEALTH INSURANCE

## 2015-10-09 ENCOUNTER — Ambulatory Visit: Payer: PRIVATE HEALTH INSURANCE | Admitting: Obstetrics and Gynecology

## 2015-10-10 ENCOUNTER — Ambulatory Visit (INDEPENDENT_AMBULATORY_CARE_PROVIDER_SITE_OTHER): Payer: PRIVATE HEALTH INSURANCE

## 2015-10-10 DIAGNOSIS — Z3402 Encounter for supervision of normal first pregnancy, second trimester: Secondary | ICD-10-CM | POA: Diagnosis not present

## 2015-10-10 DIAGNOSIS — Z331 Pregnant state, incidental: Secondary | ICD-10-CM

## 2015-10-14 ENCOUNTER — Telehealth: Payer: Self-pay | Admitting: Obstetrics and Gynecology

## 2015-10-14 NOTE — Telephone Encounter (Signed)
[redacted] WKS PREGNANT/ PT HAVING PAIN ON RIGHT SIDE, CONSISTENT. WORSE WHEN WALKING

## 2015-10-14 NOTE — Telephone Encounter (Signed)
Left pt detailed message.

## 2015-10-17 ENCOUNTER — Encounter: Payer: Self-pay | Admitting: Obstetrics and Gynecology

## 2015-10-22 ENCOUNTER — Inpatient Hospital Stay: Payer: PRIVATE HEALTH INSURANCE

## 2015-10-22 ENCOUNTER — Encounter: Payer: Self-pay | Admitting: *Deleted

## 2015-10-22 ENCOUNTER — Observation Stay
Admission: EM | Admit: 2015-10-22 | Discharge: 2015-10-22 | Disposition: A | Discharge status: Home / Self Care | Payer: PRIVATE HEALTH INSURANCE | Attending: Obstetrics and Gynecology | Admitting: Obstetrics and Gynecology

## 2015-10-22 DIAGNOSIS — O26832 Pregnancy related renal disease, second trimester: Secondary | ICD-10-CM | POA: Diagnosis not present

## 2015-10-22 DIAGNOSIS — N132 Hydronephrosis with renal and ureteral calculous obstruction: Secondary | ICD-10-CM | POA: Diagnosis not present

## 2015-10-22 DIAGNOSIS — O09899 Supervision of other high risk pregnancies, unspecified trimester: Secondary | ICD-10-CM

## 2015-10-22 DIAGNOSIS — Z2839 Other underimmunization status: Secondary | ICD-10-CM

## 2015-10-22 DIAGNOSIS — N200 Calculus of kidney: Secondary | ICD-10-CM

## 2015-10-22 DIAGNOSIS — O9989 Other specified diseases and conditions complicating pregnancy, childbirth and the puerperium: Secondary | ICD-10-CM | POA: Diagnosis present

## 2015-10-22 DIAGNOSIS — Z283 Underimmunization status: Secondary | ICD-10-CM

## 2015-10-22 DIAGNOSIS — Z3A21 21 weeks gestation of pregnancy: Secondary | ICD-10-CM | POA: Insufficient documentation

## 2015-10-22 DIAGNOSIS — R10A0 Flank pain, unspecified side: Secondary | ICD-10-CM | POA: Diagnosis present

## 2015-10-22 DIAGNOSIS — O99891 Other specified diseases and conditions complicating pregnancy: Secondary | ICD-10-CM

## 2015-10-22 DIAGNOSIS — Z87442 Personal history of urinary calculi: Secondary | ICD-10-CM

## 2015-10-22 DIAGNOSIS — Z3490 Encounter for supervision of normal pregnancy, unspecified, unspecified trimester: Secondary | ICD-10-CM

## 2015-10-22 DIAGNOSIS — R109 Unspecified abdominal pain: Secondary | ICD-10-CM

## 2015-10-22 HISTORY — DX: Calculus of kidney: N20.0

## 2015-10-22 LAB — URINALYSIS COMPLETE WITH MICROSCOPIC (ARMC ONLY)
Bacteria, UA: NONE SEEN
Bilirubin Urine: NEGATIVE
Glucose, UA: 50 mg/dL — AB
Ketones, ur: NEGATIVE mg/dL
Leukocytes, UA: NEGATIVE
Nitrite: NEGATIVE
Protein, ur: NEGATIVE mg/dL
Specific Gravity, Urine: 1.024 (ref 1.005–1.030)
pH: 5 (ref 5.0–8.0)

## 2015-10-22 MED ORDER — OXYCODONE-ACETAMINOPHEN 5-325 MG PO TABS: 1.0000 | ORAL_TABLET | ORAL | Status: DC | PRN

## 2015-10-22 NOTE — Progress Notes (Signed)
Pt. Transported via WC to [place] for renal US via radiology tech.

## 2015-10-22 NOTE — Progress Notes (Signed)
Pt. Returned from radiology to room OBS #1.

## 2015-10-22 NOTE — Final Progress Note (Signed)
L&D OB Triage Note  SUBJECTIVE Evelyn Caldwell is a 25 y.o. G1P0 female at [redacted]w[redacted]d, EDD Estimated Date of Delivery: 02/29/16 who presented to triage with complaints of acute rt flank pain. No UTI symptoms. No Fever, nausea, vomiting.    OBJECTIVE Nursing Evaluation: BP 111/59 mmHg  Pulse 79  Temp(Src) 98.1 F (36.7 C) (Oral)  Resp 16  Ht 5\' 8"  (1.727 m)  Wt 178 lb (80.74 kg)  BMI 27.07 kg/m2  LMP 05/25/2015 (Exact Date) findings significant for nephrolithiasis, non obstructing: UA 2+Heme Kidney U/S - Mild hydronephrosis on rt; Bilateral Ureteral jets seen. Findings unchanged from prior CT scan 1 year ago. No CVAT.    NST INTERPRETATION: Indications: Flank paion  Mode: Doppler Baseline Rate (A): 144 bpm (LRQ)           Contraction Frequency (min): none  ASSESSMENT Impression:  1. Pregnancy:  G1P0 at [redacted]w[redacted]d , EDD Estimated Date of Delivery: 02/29/16 2.  Normal FHR tracing for [redacted] weeks gestation 3. Hematuria and Renal U/S suspicious for Non obstructive nephrolithiasis  PLAN 1. Reassurance given 2. Increase PO hydration 3. Percocet 5/325, #20, NORF 3. Continue routine prenatal care as scheduled   Brayton Mars, MD

## 2015-10-22 NOTE — OB Triage Note (Signed)
Pt. Present with right side abd. Pain radiating to mid back; pain started this morning at 11:00 a.m. While pt. Was eating.

## 2015-10-22 NOTE — Discharge Instructions (Signed)
Discharge instructions reviewed with patient, pt. signed and verbalized understanding. Roxicet prescription given, pt. Verbalized understanding.

## 2015-10-28 ENCOUNTER — Ambulatory Visit (INDEPENDENT_AMBULATORY_CARE_PROVIDER_SITE_OTHER): Payer: PRIVATE HEALTH INSURANCE | Admitting: Obstetrics and Gynecology

## 2015-10-28 ENCOUNTER — Encounter: Payer: Self-pay | Admitting: Obstetrics and Gynecology

## 2015-10-28 VITALS — BP 97/61 | HR 89 | Wt 177.6 lb

## 2015-10-28 DIAGNOSIS — Z3492 Encounter for supervision of normal pregnancy, unspecified, second trimester: Secondary | ICD-10-CM

## 2015-10-28 LAB — POCT URINALYSIS DIPSTICK
Bilirubin, UA: NEGATIVE
Color, UA: NEGATIVE
Glucose, UA: NEGATIVE
Ketones, UA: NEGATIVE
Leukocytes, UA: NEGATIVE
Nitrite, UA: NEGATIVE
Protein, UA: NEGATIVE
Spec Grav, UA: 1.02
Urobilinogen, UA: 0.2
pH, UA: 6

## 2015-10-28 NOTE — Progress Notes (Signed)
ROB- pt denies any complaints

## 2015-10-28 NOTE — Progress Notes (Signed)
ROB- doing well, no concerns, discussed enrolling in [facility], [place], and [place]

## 2015-11-25 ENCOUNTER — Ambulatory Visit (INDEPENDENT_AMBULATORY_CARE_PROVIDER_SITE_OTHER): Payer: PRIVATE HEALTH INSURANCE | Admitting: Obstetrics and Gynecology

## 2015-11-25 ENCOUNTER — Encounter: Payer: Self-pay | Admitting: Obstetrics and Gynecology

## 2015-11-25 VITALS — BP 116/80 | HR 91 | Wt 182.7 lb

## 2015-11-25 DIAGNOSIS — Z3493 Encounter for supervision of normal pregnancy, unspecified, third trimester: Secondary | ICD-10-CM

## 2015-11-25 LAB — POCT URINALYSIS DIPSTICK
Bilirubin, UA: NEGATIVE
Glucose, UA: NEGATIVE
Ketones, UA: NEGATIVE
Nitrite, UA: NEGATIVE
Protein, UA: NEGATIVE
Spec Grav, UA: 1.02
Urobilinogen, UA: 0.2
pH, UA: 6

## 2015-11-25 NOTE — Progress Notes (Signed)
ROB-doing well, spotting possible kidney stone or UTI- urine sent for culture;  Glucola next visit

## 2015-11-25 NOTE — Progress Notes (Signed)
ROB- pt is doing well, had episode of some dark brown spotting, some blood noted on UA today

## 2015-11-26 ENCOUNTER — Other Ambulatory Visit: Payer: Self-pay | Admitting: Obstetrics and Gynecology

## 2015-11-26 LAB — URINE CULTURE

## 2015-11-26 MED ORDER — NITROFURANTOIN MONOHYD MACRO 100 MG PO CAPS: 100.0000 mg | ORAL_CAPSULE | Freq: Two times a day (BID) | ORAL | Status: DC

## 2015-12-05 ENCOUNTER — Encounter: Payer: PRIVATE HEALTH INSURANCE | Admitting: Obstetrics and Gynecology

## 2015-12-16 ENCOUNTER — Ambulatory Visit (INDEPENDENT_AMBULATORY_CARE_PROVIDER_SITE_OTHER): Payer: PRIVATE HEALTH INSURANCE | Admitting: Obstetrics and Gynecology

## 2015-12-16 ENCOUNTER — Other Ambulatory Visit: Payer: PRIVATE HEALTH INSURANCE

## 2015-12-16 ENCOUNTER — Encounter: Payer: Self-pay | Admitting: Obstetrics and Gynecology

## 2015-12-16 VITALS — BP 120/63 | HR 92 | Wt 184.4 lb

## 2015-12-16 DIAGNOSIS — Z131 Encounter for screening for diabetes mellitus: Secondary | ICD-10-CM

## 2015-12-16 DIAGNOSIS — Z3493 Encounter for supervision of normal pregnancy, unspecified, third trimester: Secondary | ICD-10-CM

## 2015-12-16 DIAGNOSIS — Z23 Encounter for immunization: Secondary | ICD-10-CM

## 2015-12-16 LAB — POCT URINALYSIS DIPSTICK
Bilirubin, UA: NEGATIVE
Glucose, UA: NEGATIVE
Ketones, UA: NEGATIVE
Nitrite, UA: NEGATIVE
Spec Grav, UA: 1.015
Urobilinogen, UA: 0.2
pH, UA: 6.5

## 2015-12-16 MED ORDER — TETANUS-DIPHTH-ACELL PERTUSSIS 5-2.5-18.5 LF-MCG/0.5 IM SUSP
0.5000 mL | Freq: Once | INTRAMUSCULAR | Status: AC
  Administered 2015-12-16: 0.5 mL via INTRAMUSCULAR

## 2015-12-16 NOTE — Progress Notes (Signed)
ROB- glucola done, urine sent for culture. Discussed cord blood donation.

## 2015-12-16 NOTE — Progress Notes (Signed)
ROB- glucola done, blood consent signed, tdap given Pt denies any sx, did see some blood on UA dip

## 2015-12-17 ENCOUNTER — Encounter: Payer: Self-pay | Admitting: Obstetrics and Gynecology

## 2015-12-17 ENCOUNTER — Other Ambulatory Visit: Payer: Self-pay | Admitting: Obstetrics and Gynecology

## 2015-12-17 DIAGNOSIS — O99019 Anemia complicating pregnancy, unspecified trimester: Secondary | ICD-10-CM

## 2015-12-17 LAB — HEMOGLOBIN AND HEMATOCRIT, BLOOD
Hematocrit: 28.4 % — ABNORMAL LOW (ref 34.0–46.6)
Hemoglobin: 9.4 g/dL — ABNORMAL LOW (ref 11.1–15.9)

## 2015-12-17 LAB — URINE CULTURE

## 2015-12-17 LAB — GLUCOSE, 1 HOUR GESTATIONAL: Gestational Diabetes Screen: 116 mg/dL (ref 65–139)

## 2015-12-17 MED ORDER — AMOXICILLIN 500 MG PO CAPS: 500.0000 mg | ORAL_CAPSULE | Freq: Three times a day (TID) | ORAL | Status: DC

## 2015-12-17 MED ORDER — FUSION PLUS PO CAPS: 1.0000 | ORAL_CAPSULE | Freq: Every day | ORAL | Status: DC

## 2015-12-18 ENCOUNTER — Telehealth: Payer: Self-pay | Admitting: *Deleted

## 2015-12-18 NOTE — Telephone Encounter (Signed)
Changed pts pharmacy

## 2015-12-19 ENCOUNTER — Encounter: Payer: PRIVATE HEALTH INSURANCE | Admitting: Obstetrics and Gynecology

## 2015-12-26 ENCOUNTER — Encounter: Payer: Self-pay | Admitting: Family Medicine

## 2015-12-30 ENCOUNTER — Ambulatory Visit (INDEPENDENT_AMBULATORY_CARE_PROVIDER_SITE_OTHER): Payer: PRIVATE HEALTH INSURANCE | Admitting: Obstetrics and Gynecology

## 2015-12-30 VITALS — BP 127/66 | HR 66 | Wt 182.8 lb

## 2015-12-30 DIAGNOSIS — Z3493 Encounter for supervision of normal pregnancy, unspecified, third trimester: Secondary | ICD-10-CM

## 2015-12-30 LAB — POCT URINALYSIS DIPSTICK
Bilirubin, UA: NEGATIVE
Glucose, UA: NEGATIVE
Ketones, UA: NEGATIVE
Nitrite, UA: NEGATIVE
Spec Grav, UA: 1.01
Urobilinogen, UA: 0.2
pH, UA: 7

## 2015-12-30 NOTE — Progress Notes (Signed)
ROB-doing well, starts breast feeding classes next week

## 2015-12-30 NOTE — Progress Notes (Signed)
ROB- pt denies any complaints

## 2016-01-06 ENCOUNTER — Other Ambulatory Visit: Payer: Self-pay | Admitting: Obstetrics and Gynecology

## 2016-01-06 ENCOUNTER — Encounter: Payer: PRIVATE HEALTH INSURANCE | Admitting: Obstetrics and Gynecology

## 2016-01-06 ENCOUNTER — Ambulatory Visit
Admission: RE | Admit: 2016-01-06 | Discharge: 2016-01-06 | Disposition: A | Discharge status: Home / Self Care | Payer: PRIVATE HEALTH INSURANCE | Source: Ambulatory Visit | Attending: Obstetrics and Gynecology | Admitting: Obstetrics and Gynecology

## 2016-01-06 ENCOUNTER — Ambulatory Visit (INDEPENDENT_AMBULATORY_CARE_PROVIDER_SITE_OTHER): Payer: PRIVATE HEALTH INSURANCE | Admitting: Obstetrics and Gynecology

## 2016-01-06 VITALS — BP 129/74 | HR 92 | Temp 97.8°F | Wt 186.1 lb

## 2016-01-06 DIAGNOSIS — N1330 Unspecified hydronephrosis: Secondary | ICD-10-CM | POA: Diagnosis not present

## 2016-01-06 DIAGNOSIS — O4703 False labor before 37 completed weeks of gestation, third trimester: Secondary | ICD-10-CM

## 2016-01-06 DIAGNOSIS — R309 Painful micturition, unspecified: Secondary | ICD-10-CM | POA: Diagnosis not present

## 2016-01-06 DIAGNOSIS — R350 Frequency of micturition: Secondary | ICD-10-CM | POA: Diagnosis not present

## 2016-01-06 DIAGNOSIS — N1339 Other hydronephrosis: Secondary | ICD-10-CM | POA: Insufficient documentation

## 2016-01-06 LAB — POCT URINALYSIS DIPSTICK
Bilirubin, UA: NEGATIVE
Glucose, UA: NEGATIVE
Ketones, UA: NEGATIVE
Nitrite, UA: NEGATIVE
Spec Grav, UA: 1.02
Urobilinogen, UA: NEGATIVE
pH, UA: 6

## 2016-01-06 LAB — FETAL FIBRONECTIN: Fetal Fibronectin: NEGATIVE

## 2016-01-06 MED ORDER — BETAMETHASONE SOD PHOS & ACET 6 (3-3) MG/ML IJ SUSP
12.0000 mg | Freq: Once | INTRAMUSCULAR | Status: AC
  Administered 2016-01-06: 12 mg via INTRAMUSCULAR

## 2016-01-06 NOTE — Patient Instructions (Signed)

## 2016-01-06 NOTE — Progress Notes (Signed)
[facility]ble OB- reports irregular contractions allnight, with RT upper back pain, amber colored urine and increased frequency all night, worse this morning, couldn't sleep; h/o renal stones in past. FFN obtained and PTL discussed, urine sent for culture, first betamethasone given and will return tomorrow for second dose; PTL precautions discussed.

## 2016-01-06 NOTE — Progress Notes (Signed)
Pt walks in c/o urinary frequency, lower back and lower abdominal pain. Urinalysis: abnormal: large blood, trace protein, leuks trace, dark yellow in color and cloudy. NONSTRESS TEST INTERPRETATION  INDICATIONS: preterm contractions  FHR baseline: 130-150 RESULTS: reactive COMMENTS:      PLAN: 1. Continue fetal kick counts twice a day. 2. Continue antepartum testing as scheduled-Biweekly 3. RTO as scheduled.  Martie Lee, LPN

## 2016-01-07 ENCOUNTER — Ambulatory Visit (INDEPENDENT_AMBULATORY_CARE_PROVIDER_SITE_OTHER): Payer: PRIVATE HEALTH INSURANCE | Admitting: Obstetrics and Gynecology

## 2016-01-07 VITALS — BP 121/77 | HR 90 | Wt 184.3 lb

## 2016-01-07 DIAGNOSIS — N1330 Unspecified hydronephrosis: Secondary | ICD-10-CM

## 2016-01-07 DIAGNOSIS — O4703 False labor before 37 completed weeks of gestation, third trimester: Secondary | ICD-10-CM | POA: Diagnosis not present

## 2016-01-07 DIAGNOSIS — O26833 Pregnancy related renal disease, third trimester: Secondary | ICD-10-CM

## 2016-01-07 MED ORDER — BETAMETHASONE SOD PHOS & ACET 6 (3-3) MG/ML IJ SUSP
12.0000 mg | Freq: Once | INTRAMUSCULAR | Status: AC
  Administered 2016-01-07: 12 mg via INTRAMUSCULAR

## 2016-01-07 NOTE — Progress Notes (Signed)
Pt. In office today for 2nd Betamethasone injection. Pt uses heating pad to back, renal ultrasound positive for kidney stone and one in May in still there. Has small amt of spotting, better than yesterday. Baby active today, less active last night. No contractions. Pt aware to contact office for any increase in vaginal bleeding or if having contractions. To have a urologist consult. Pt aware.

## 2016-01-08 LAB — CULTURE, OB URINE

## 2016-01-08 LAB — URINE CULTURE, OB REFLEX

## 2016-01-13 ENCOUNTER — Ambulatory Visit (INDEPENDENT_AMBULATORY_CARE_PROVIDER_SITE_OTHER): Payer: PRIVATE HEALTH INSURANCE | Admitting: Obstetrics and Gynecology

## 2016-01-13 VITALS — BP 126/65 | HR 106 | Wt 184.2 lb

## 2016-01-13 DIAGNOSIS — Z3493 Encounter for supervision of normal pregnancy, unspecified, third trimester: Secondary | ICD-10-CM

## 2016-01-13 LAB — POCT URINALYSIS DIPSTICK
Bilirubin, UA: NEGATIVE
Glucose, UA: NEGATIVE
Ketones, UA: NEGATIVE
Nitrite, UA: NEGATIVE
Spec Grav, UA: 1.01
Urobilinogen, UA: 0.2
pH, UA: 7

## 2016-01-13 NOTE — Progress Notes (Signed)
ROB-has urology appt the 28th, spouse for labor support.

## 2016-01-13 NOTE — Progress Notes (Signed)
ROB- pt is doing well, she is hurting in her back, not sure if baby or kidney stone

## 2016-01-20 ENCOUNTER — Encounter: Payer: Self-pay | Admitting: Obstetrics and Gynecology

## 2016-01-26 ENCOUNTER — Ambulatory Visit (INDEPENDENT_AMBULATORY_CARE_PROVIDER_SITE_OTHER): Payer: PRIVATE HEALTH INSURANCE | Admitting: Urology

## 2016-01-26 ENCOUNTER — Encounter: Payer: Self-pay | Admitting: Urology

## 2016-01-26 VITALS — BP 127/74 | HR 105 | Ht 67.0 in | Wt 184.0 lb

## 2016-01-26 DIAGNOSIS — N1330 Unspecified hydronephrosis: Secondary | ICD-10-CM | POA: Diagnosis not present

## 2016-01-26 LAB — URINALYSIS, COMPLETE
Bilirubin, UA: NEGATIVE
Glucose, UA: NEGATIVE
Ketones, UA: NEGATIVE
Nitrite, UA: NEGATIVE
Specific Gravity, UA: >1.03 — ABNORMAL HIGH (ref 1.005–1.030)
Urobilinogen, Ur: 1 mg/dL (ref 0.2–1.0)
pH, UA: 6 (ref 5.0–7.5)

## 2016-01-26 LAB — MICROSCOPIC EXAMINATION: Epithelial Cells (non renal): >10 /hpf — AB (ref 0–10)

## 2016-01-26 NOTE — Progress Notes (Signed)
01/26/2016 12:21 PM   Evelyn Caldwell [facility] 08/24/1990 [id number]  Referring provider: Margarita Rana, MD [address] [place], [place] [address]  Chief Complaint  Patient presents with  . New Patient (Initial Visit)    Hydronephrosis    HPI: 25 year old female who was referred for right-sided hydronephrosis and intermittent right-sided flank pain. The patient is [redacted] weeks pregnant. She presented to the emergency room with pain and nausea and vomiting. An ultrasound was performed demonstrating right hydronephrosis with echogenic shadow in the lower pole consistent with a 14 mm stone. There was a leak right-sided ureteral jet noted in the bladder.  Since the patient has been discharged from the emergency room she's not had any significant or severe pain as that which brought the emergency department. She denies any flank pain. She has chills. She denies any dysuria or gross hematuria. Next  The patient had a CT scan in January 2016 for similar symptoms. She was noted to have no stones within the very tract hydronephrosis.     PMH: Past Medical History:  Diagnosis Date  . Kidney stone   . Vulvar lesion     Surgical History: Past Surgical History:  Procedure Laterality Date  . none      Home Medications:    Medication List       Accurate as of 01/26/16 12:21 PM. Always use your most recent med list.          FUSION PLUS Caps Take 1 capsule by mouth daily.   prenatal multivitamin Tabs tablet Take 1 tablet by mouth daily at 12 noon.       Allergies: No Known Allergies  Family History: Family History  Problem Relation Age of Onset  . Congestive Heart Failure Maternal Grandfather   . Parkinson's disease Paternal Grandmother   . Kidney failure Cousin   . Kidney cancer Neg Hx   . Prostate cancer Neg Hx     Social History:  reports that she has never smoked. She has never used smokeless tobacco. She reports that she does not drink alcohol or use  drugs.  ROS: UROLOGY Frequent Urination?: No Hard to postpone urination?: No Burning/pain with urination?: No Get up at night to urinate?: Yes Leakage of urine?: No Urine stream starts and stops?: No Trouble starting stream?: No Do you have to strain to urinate?: No Blood in urine?: Yes Urinary tract infection?: Yes Sexually transmitted disease?: No Injury to kidneys or bladder?: No Painful intercourse?: No Weak stream?: No Currently pregnant?: Yes Vaginal bleeding?: No  Gastrointestinal Nausea?: No Vomiting?: No Indigestion/heartburn?: Yes Diarrhea?: No Constipation?: No  Constitutional Fever: No Night sweats?: No Weight loss?: No Fatigue?: No  Skin Skin rash/lesions?: No Itching?: No  Eyes Blurred vision?: No Double vision?: No  Ears/Nose/Throat Sore throat?: No Sinus problems?: No  Hematologic/Lymphatic Swollen glands?: No Easy bruising?: No  Cardiovascular Leg swelling?: No Chest pain?: No  Respiratory Cough?: No Shortness of breath?: No  Endocrine Excessive thirst?: No  Musculoskeletal Back pain?: Yes Joint pain?: No  Neurological Headaches?: Yes Dizziness?: No  Psychologic Depression?: No Anxiety?: No  Physical Exam: BP 127/74   Pulse (!) 105   Ht 5\' 7"  (1.702 m)   Wt 83.5 kg (184 lb)   LMP 05/25/2015 (Exact Date)   BMI 28.82 kg/m   Constitutional:  Alert and oriented, No acute distress. HEENT: [place] AT, moist mucus membranes.  Trachea midline, no masses. Cardiovascular: No clubbing, cyanosis, or edema. Respiratory: Normal respiratory effort, no increased work  of breathing. GI: Abdomen is soft, nontender, nondistended, no abdominal masses GU: No CVA tenderness. Gravid uterus Skin: No rashes, bruises or suspicious lesions. Lymph: No cervical or inguinal adenopathy. Neurologic: Grossly intact, no focal deficits, moving all 4 extremities. Psychiatric: Normal mood and affect.  Laboratory Data: Lab Results  Component Value  Date   WBC 8.4 07/29/2015   HGB 13.7 06/03/2014   HCT 28.4 (L) 12/16/2015   MCV 83 07/29/2015   PLT 243 07/29/2015    Lab Results  Component Value Date   CREATININE 0.58 01/24/2015    No results found for: PSA  No results found for: TESTOSTERONE  Lab Results  Component Value Date   HGBA1C 5.0 07/29/2015    Urinalysis    Component Value Date/Time   COLORURINE YELLOW (A) 10/22/2015 1414   APPEARANCEUR CLOUDY (A) 10/22/2015 1414   APPEARANCEUR Clear 07/29/2015 1452   LABSPEC 1.024 10/22/2015 1414   LABSPEC 1.023 06/03/2014 0024   PHURINE 5.0 10/22/2015 1414   GLUCOSEU 50 (A) 10/22/2015 1414   GLUCOSEU Negative 06/03/2014 0024   HGBUR 2+ (A) 10/22/2015 1414   BILIRUBINUR neg 01/13/2016 1114   BILIRUBINUR Negative 07/29/2015 1452   BILIRUBINUR Negative 06/03/2014 0024   KETONESUR NEGATIVE 10/22/2015 1414   PROTEINUR trace 01/13/2016 1114   PROTEINUR NEGATIVE 10/22/2015 1414   UROBILINOGEN 0.2 01/13/2016 1114   NITRITE neg 01/13/2016 1114   NITRITE NEGATIVE 10/22/2015 1414   LEUKOCYTESUR moderate (2+) (A) 01/13/2016 1114   LEUKOCYTESUR Negative 07/29/2015 1452   LEUKOCYTESUR Negative 06/03/2014 0024    Pertinent Imaging: Abdomen abdomen a reviewed both the CT scan from 2016 as well as the recent ultrasound  Assessment & Plan:  I suspect the patient has hydronephrosis of pregnancy with associated pain and discomfort related to 33 week fetus. I did review her ultrasound and note a 14 mm echogenic area in the lower pole of the right kidney. However, I don't think this is stone be highly unlikely for her to have developed a 14 mm down the course of the last 18 months. Further, her urine is normal. Either way, the stone is nonobstructing and not the etiology of her pain. I don't think she has an obstructing stone in the ureter given she has a ureteral jets on that side. At this point, I recommended that we follow up in 3 months after she delivered her baby with a repeat renal  ultrasound. If she is having signs or symptoms of renal colic/obstructing stone prior to that, ideally we could obtain a CT scan. Otherwise, we'll plan follow-up in 3 months. . 1. Hydronephrosis, unspecified hydronephrosis type  - Urinalysis, Complete - US Renal; Future   Return in about 3 months (around 04/27/2016) for renal ultrasound.  Ardis Hughs, [facility] [address] [place], [place] [address] [phone]

## 2016-01-27 ENCOUNTER — Ambulatory Visit (INDEPENDENT_AMBULATORY_CARE_PROVIDER_SITE_OTHER): Payer: PRIVATE HEALTH INSURANCE | Admitting: Obstetrics and Gynecology

## 2016-01-27 VITALS — BP 107/73 | HR 82 | Wt 185.5 lb

## 2016-01-27 DIAGNOSIS — Z3493 Encounter for supervision of normal pregnancy, unspecified, third trimester: Secondary | ICD-10-CM

## 2016-01-27 DIAGNOSIS — Z3685 Encounter for antenatal screening for Streptococcus B: Secondary | ICD-10-CM

## 2016-01-27 DIAGNOSIS — Z36 Encounter for antenatal screening of mother: Secondary | ICD-10-CM

## 2016-01-27 DIAGNOSIS — Z113 Encounter for screening for infections with a predominantly sexual mode of transmission: Secondary | ICD-10-CM

## 2016-01-27 LAB — POCT URINALYSIS DIPSTICK
Bilirubin, UA: NEGATIVE
Glucose, UA: NEGATIVE
Ketones, UA: NEGATIVE
Leukocytes, UA: NEGATIVE
Nitrite, UA: NEGATIVE
Spec Grav, UA: 1.01
Urobilinogen, UA: 0.2
pH, UA: 6.5

## 2016-01-27 NOTE — Progress Notes (Signed)
ROB- cultures obtained, low back pain

## 2016-01-27 NOTE — Progress Notes (Signed)
ROB-doing well, saw urologist, no change in renal status, cultures next visit.

## 2016-02-03 ENCOUNTER — Ambulatory Visit (INDEPENDENT_AMBULATORY_CARE_PROVIDER_SITE_OTHER): Payer: PRIVATE HEALTH INSURANCE | Admitting: Obstetrics and Gynecology

## 2016-02-03 ENCOUNTER — Encounter: Payer: PRIVATE HEALTH INSURANCE | Admitting: Obstetrics and Gynecology

## 2016-02-03 VITALS — BP 107/77 | HR 93 | Wt 185.1 lb

## 2016-02-03 DIAGNOSIS — Z113 Encounter for screening for infections with a predominantly sexual mode of transmission: Secondary | ICD-10-CM | POA: Diagnosis not present

## 2016-02-03 DIAGNOSIS — Z36 Encounter for antenatal screening of mother: Secondary | ICD-10-CM | POA: Diagnosis not present

## 2016-02-03 DIAGNOSIS — Z3493 Encounter for supervision of normal pregnancy, unspecified, third trimester: Secondary | ICD-10-CM

## 2016-02-03 LAB — POCT URINALYSIS DIPSTICK
Bilirubin, UA: NEGATIVE
Glucose, UA: NEGATIVE
Ketones, UA: NEGATIVE
Nitrite, UA: NEGATIVE
Spec Grav, UA: 1.01
Urobilinogen, UA: 0.2
pH, UA: 7

## 2016-02-03 NOTE — Progress Notes (Signed)
ROB-doing well, cultures obtained, spouse as labor support, open to all methods of pain management.

## 2016-02-03 NOTE — Progress Notes (Signed)
ROB- cultures obtained, pt is having some pelvic pressure 

## 2016-02-04 LAB — GC/CHLAMYDIA PROBE AMP
Chlamydia trachomatis, NAA: NEGATIVE
Neisseria gonorrhoeae by PCR: NEGATIVE

## 2016-02-05 LAB — STREP GP B NAA: Strep Gp B NAA: NEGATIVE

## 2016-02-10 ENCOUNTER — Ambulatory Visit (INDEPENDENT_AMBULATORY_CARE_PROVIDER_SITE_OTHER): Payer: PRIVATE HEALTH INSURANCE | Admitting: Obstetrics and Gynecology

## 2016-02-10 VITALS — BP 98/70 | HR 75 | Wt 190.1 lb

## 2016-02-10 DIAGNOSIS — Z3493 Encounter for supervision of normal pregnancy, unspecified, third trimester: Secondary | ICD-10-CM

## 2016-02-10 LAB — POCT URINALYSIS DIPSTICK
Bilirubin, UA: NEGATIVE
Glucose, UA: NEGATIVE
Ketones, UA: NEGATIVE
Nitrite, UA: NEGATIVE
Spec Grav, UA: 1.015
Urobilinogen, UA: 0.2
pH, UA: 6

## 2016-02-10 NOTE — Progress Notes (Signed)
ROB- R hip pain, pt is having some pelvic pressure

## 2016-02-10 NOTE — Progress Notes (Signed)
ROB- doing well, discussed negative cultures; labor precautions discussed.

## 2016-02-17 ENCOUNTER — Ambulatory Visit (INDEPENDENT_AMBULATORY_CARE_PROVIDER_SITE_OTHER): Payer: PRIVATE HEALTH INSURANCE | Admitting: Obstetrics and Gynecology

## 2016-02-17 VITALS — BP 103/68 | HR 81 | Wt 188.9 lb

## 2016-02-17 DIAGNOSIS — Z3493 Encounter for supervision of normal pregnancy, unspecified, third trimester: Secondary | ICD-10-CM

## 2016-02-17 LAB — POCT URINALYSIS DIPSTICK
Bilirubin, UA: NEGATIVE
Glucose, UA: NEGATIVE
Ketones, UA: NEGATIVE
Leukocytes, UA: NEGATIVE
Nitrite, UA: NEGATIVE
Protein, UA: NEGATIVE
Spec Grav, UA: 1.01
Urobilinogen, UA: 0.2
pH, UA: 6.5

## 2016-02-17 NOTE — Progress Notes (Signed)
ROB- doing well. No changes.

## 2016-02-17 NOTE — Progress Notes (Signed)
ROB- pt is having B feet swelling, she is having some pelvic pressure

## 2016-02-23 ENCOUNTER — Inpatient Hospital Stay
Admission: EM | Admit: 2016-02-23 | Discharge: 2016-02-24 | Disposition: A | Discharge status: Home / Self Care | Payer: PRIVATE HEALTH INSURANCE | Attending: Obstetrics and Gynecology | Admitting: Obstetrics and Gynecology

## 2016-02-23 DIAGNOSIS — O9989 Other specified diseases and conditions complicating pregnancy, childbirth and the puerperium: Secondary | ICD-10-CM

## 2016-02-23 DIAGNOSIS — O26893 Other specified pregnancy related conditions, third trimester: Secondary | ICD-10-CM | POA: Insufficient documentation

## 2016-02-23 DIAGNOSIS — O99891 Other specified diseases and conditions complicating pregnancy: Secondary | ICD-10-CM

## 2016-02-23 DIAGNOSIS — O09899 Supervision of other high risk pregnancies, unspecified trimester: Secondary | ICD-10-CM

## 2016-02-23 DIAGNOSIS — Z3A39 39 weeks gestation of pregnancy: Secondary | ICD-10-CM | POA: Insufficient documentation

## 2016-02-23 DIAGNOSIS — Z2839 Other underimmunization status: Secondary | ICD-10-CM

## 2016-02-23 DIAGNOSIS — Z283 Underimmunization status: Secondary | ICD-10-CM

## 2016-02-24 ENCOUNTER — Ambulatory Visit (INDEPENDENT_AMBULATORY_CARE_PROVIDER_SITE_OTHER): Payer: PRIVATE HEALTH INSURANCE | Admitting: Obstetrics and Gynecology

## 2016-02-24 VITALS — BP 115/72 | HR 95 | Wt 190.0 lb

## 2016-02-24 DIAGNOSIS — O26893 Other specified pregnancy related conditions, third trimester: Secondary | ICD-10-CM | POA: Diagnosis not present

## 2016-02-24 DIAGNOSIS — Z3A39 39 weeks gestation of pregnancy: Secondary | ICD-10-CM | POA: Diagnosis not present

## 2016-02-24 DIAGNOSIS — Z3493 Encounter for supervision of normal pregnancy, unspecified, third trimester: Secondary | ICD-10-CM

## 2016-02-24 LAB — POCT URINALYSIS DIPSTICK
Bilirubin, UA: NEGATIVE
Glucose, UA: NEGATIVE
Ketones, UA: NEGATIVE
Leukocytes, UA: NEGATIVE
Nitrite, UA: NEGATIVE
Protein, UA: NEGATIVE
Spec Grav, UA: 1.015
Urobilinogen, UA: 0.2
pH, UA: 6.5

## 2016-02-24 MED ORDER — ZOLPIDEM TARTRATE 5 MG PO TABS: 5.0000 mg | ORAL_TABLET | Freq: Every evening | ORAL | 1 refills | Status: DC | PRN

## 2016-02-24 MED ORDER — ONDANSETRON 4 MG PO TBDP: 4.0000 mg | ORAL_TABLET | Freq: Four times a day (QID) | ORAL | 0 refills | Status: DC | PRN

## 2016-02-24 MED ORDER — ACETAMINOPHEN 325 MG PO TABS
ORAL_TABLET | ORAL | Status: AC
  Administered 2016-02-24: 650 mg via ORAL
  Filled 2016-02-24: qty 2

## 2016-02-24 MED ORDER — HYDROMORPHONE HCL 4 MG PO TABS: 4.0000 mg | ORAL_TABLET | ORAL | 0 refills | Status: DC | PRN

## 2016-02-24 MED ORDER — ACETAMINOPHEN 325 MG PO TABS
650.0000 mg | ORAL_TABLET | Freq: Once | ORAL | Status: AC
  Administered 2016-02-24: 650 mg via ORAL

## 2016-02-24 NOTE — OB Triage Note (Signed)
Patient came in for observation for lower back pain and hip pain. Patient rates pain 7 out of 10. Patient denies contractions, vaginal bleeding, and spotting. Patient denies leaking of fluid. Patient states she has been feeling the baby move fine. Vital signs stable and patient afebrile. FAHR baseline 135 with moderate variability with accelerations 15 x 15 and no decelerations. Husband at bedside. Will continue to monitor.

## 2016-02-24 NOTE — Progress Notes (Signed)
ROB-no change in cervix since left the hospital. Gave rx for sleeping pill and pain med, exhausted from being up all night.

## 2016-02-24 NOTE — OB Triage Provider Note (Signed)
L&D OB Triage Note  Evelyn Caldwell is a 25 y.o. G1P0 female at [redacted]w[redacted]d, EDD Estimated Date of Delivery: 02/29/16 who presented to triage for complaints of irregular contractions since last night.  She was evaluated by the nurses with no significant findings/findings significant for labor. Vital signs stable. An NST was performed and has been reviewed by myself.   NST INTERPRETATION: Indications: rule out uterine contractions  Mode: External Baseline Rate (A): 135 bpm Variability: Moderate Accelerations: 15 x 15 Decelerations: None     Contraction Frequency (min): 3-6  Impression: reactive   Plan: NST performed was reviewed and was found to be reactive. She was discharged home with bleeding/labor precautions.  Continue routine prenatal care. Follow up with OB/GYN as previously scheduled.     Caryssa Elzey Rockney Ghee, CNM

## 2016-02-24 NOTE — Progress Notes (Signed)
ROB- pt is hurting in her back, some pelvic pressure,large blood in UA

## 2016-03-02 ENCOUNTER — Ambulatory Visit (INDEPENDENT_AMBULATORY_CARE_PROVIDER_SITE_OTHER): Payer: PRIVATE HEALTH INSURANCE

## 2016-03-02 ENCOUNTER — Ambulatory Visit (INDEPENDENT_AMBULATORY_CARE_PROVIDER_SITE_OTHER): Payer: PRIVATE HEALTH INSURANCE | Admitting: Obstetrics and Gynecology

## 2016-03-02 VITALS — BP 124/66 | HR 76 | Wt 192.8 lb

## 2016-03-02 DIAGNOSIS — Z3493 Encounter for supervision of normal pregnancy, unspecified, third trimester: Secondary | ICD-10-CM

## 2016-03-02 LAB — POCT URINALYSIS DIPSTICK
Bilirubin, UA: NEGATIVE
Glucose, UA: NEGATIVE
Ketones, UA: NEGATIVE
Nitrite, UA: NEGATIVE
Protein, UA: NEGATIVE
Spec Grav, UA: 1.015
Urobilinogen, UA: 0.2
pH, UA: 6

## 2016-03-02 NOTE — Progress Notes (Signed)
ROB: scheduled IOL for 10/9 at 8pm with cytotec. Labor precautions reviewed, will repeat NST in 3 days  Indications:Post Dates Growth and AFI Findings:  Singleton intrauterine pregnancy is visualized with FHR at 155 BPM. Biometrics give an (U/S) Gestational age of 49 6/7 weeks and an (U/S) EDD of 03/03/16; this correlates with the clinically established EDD of 02/29/16.  Fetal presentation is Vertex.  EFW: 3892g (8lb 9oz) Williams 75th percentile. Placenta: Anterior, grade 2, remote to cervix . AFI: 9.2cm.    Impression: 1. 39 6/7 week Viable Singleton Intrauterine pregnancy by U/S. 2. (U/S) EDD is consistent with Clinically established (LMP) EDD of 02/29/16. 3. Adequate Growth and AFI

## 2016-03-02 NOTE — Progress Notes (Signed)
ROB- [place] & NST today, pt passed kidney stone this am, also passed one in her urine here in the office

## 2016-03-05 ENCOUNTER — Ambulatory Visit (INDEPENDENT_AMBULATORY_CARE_PROVIDER_SITE_OTHER): Payer: PRIVATE HEALTH INSURANCE | Admitting: Obstetrics and Gynecology

## 2016-03-05 VITALS — BP 115/65 | HR 79 | Wt 192.0 lb

## 2016-03-05 DIAGNOSIS — Z1389 Encounter for screening for other disorder: Secondary | ICD-10-CM

## 2016-03-05 DIAGNOSIS — Z369 Encounter for antenatal screening, unspecified: Secondary | ICD-10-CM

## 2016-03-05 LAB — POCT URINALYSIS DIPSTICK
Bilirubin, UA: NEGATIVE
Glucose, UA: NEGATIVE
Ketones, UA: NEGATIVE
Nitrite, UA: NEGATIVE
Protein, UA: NEGATIVE
Spec Grav, UA: 1.02
Urobilinogen, UA: NEGATIVE
pH, UA: 7

## 2016-03-05 NOTE — Progress Notes (Signed)
NONSTRESS TEST INTERPRETATION  INDICATIONS: Post Dates  FHR baseline: 130-140 RESULTS: reactive COMMENTS: NST B/P's: B/P-119/65, P-84;  B/P-116/67, P-81  PLAN: 1. Continue fetal kick counts twice a day. 2. IOL on Monday.   Martie Lee, LPN   NST reviewed and reactive, with spontaneous negative OCT.  Melody Avant, CNM

## 2016-03-08 ENCOUNTER — Inpatient Hospital Stay
Admit: 2016-03-08 | Discharge: 2016-03-11 | DRG: 775 | Disposition: A | Discharge status: Home / Self Care | Payer: PRIVATE HEALTH INSURANCE | Source: Intra-hospital | Attending: Obstetrics and Gynecology | Admitting: Obstetrics and Gynecology

## 2016-03-08 DIAGNOSIS — Z3A41 41 weeks gestation of pregnancy: Secondary | ICD-10-CM | POA: Diagnosis not present

## 2016-03-08 DIAGNOSIS — Z8759 Personal history of other complications of pregnancy, childbirth and the puerperium: Secondary | ICD-10-CM

## 2016-03-08 DIAGNOSIS — O9902 Anemia complicating childbirth: Secondary | ICD-10-CM | POA: Diagnosis present

## 2016-03-08 DIAGNOSIS — Z2839 Other underimmunization status: Secondary | ICD-10-CM

## 2016-03-08 DIAGNOSIS — Z23 Encounter for immunization: Secondary | ICD-10-CM | POA: Diagnosis not present

## 2016-03-08 DIAGNOSIS — D649 Anemia, unspecified: Secondary | ICD-10-CM | POA: Diagnosis present

## 2016-03-08 DIAGNOSIS — Z8249 Family history of ischemic heart disease and other diseases of the circulatory system: Secondary | ICD-10-CM

## 2016-03-08 DIAGNOSIS — O99891 Other specified diseases and conditions complicating pregnancy: Secondary | ICD-10-CM

## 2016-03-08 DIAGNOSIS — Z3403 Encounter for supervision of normal first pregnancy, third trimester: Secondary | ICD-10-CM | POA: Diagnosis not present

## 2016-03-08 DIAGNOSIS — O480 Post-term pregnancy: Principal | ICD-10-CM | POA: Diagnosis present

## 2016-03-08 DIAGNOSIS — O09899 Supervision of other high risk pregnancies, unspecified trimester: Secondary | ICD-10-CM

## 2016-03-08 DIAGNOSIS — Z283 Underimmunization status: Secondary | ICD-10-CM

## 2016-03-08 DIAGNOSIS — O9989 Other specified diseases and conditions complicating pregnancy, childbirth and the puerperium: Secondary | ICD-10-CM

## 2016-03-08 LAB — CBC
HCT: 28.7 % — ABNORMAL LOW (ref 35.0–47.0)
Hemoglobin: 10.1 g/dL — ABNORMAL LOW (ref 12.0–16.0)
MCH: 28.4 pg (ref 26.0–34.0)
MCHC: 35.1 g/dL (ref 32.0–36.0)
MCV: 81 fL (ref 80.0–100.0)
Platelets: 242 10*3/uL (ref 150–440)
RBC: 3.55 MIL/uL — ABNORMAL LOW (ref 3.80–5.20)
RDW: 13.8 % (ref 11.5–14.5)
WBC: 8.8 10*3/uL (ref 3.6–11.0)

## 2016-03-08 MED ORDER — SOD CITRATE-CITRIC ACID 500-334 MG/5ML PO SOLN
30.0000 mL | ORAL | Status: DC | PRN
  Filled 2016-03-08: qty 30

## 2016-03-08 MED ORDER — ONDANSETRON HCL 4 MG/2ML IJ SOLN: 4.0000 mg | Freq: Four times a day (QID) | INTRAMUSCULAR | Status: DC | PRN

## 2016-03-08 MED ORDER — TERBUTALINE SULFATE 1 MG/ML IJ SOLN: 0.2500 mg | Freq: Once | INTRAMUSCULAR | Status: DC | PRN

## 2016-03-08 MED ORDER — MISOPROSTOL 25 MCG QUARTER TABLET
25.0000 ug | ORAL_TABLET | ORAL | Status: DC | PRN
  Administered 2016-03-08 – 2016-03-09 (×2): 25 ug via VAGINAL
  Filled 2016-03-08: qty 1
  Filled 2016-03-08: qty 0.25
  Filled 2016-03-08: qty 1

## 2016-03-08 MED ORDER — OXYTOCIN 40 UNITS IN LACTATED RINGERS INFUSION - SIMPLE MED
1.0000 m[IU]/min | INTRAVENOUS | Status: DC
  Administered 2016-03-09: 2 m[IU]/min via INTRAVENOUS
  Filled 2016-03-08: qty 1000

## 2016-03-08 MED ORDER — ACETAMINOPHEN 325 MG PO TABS
650.0000 mg | ORAL_TABLET | ORAL | Status: DC | PRN
  Administered 2016-03-09: 650 mg via ORAL
  Filled 2016-03-08: qty 2

## 2016-03-08 MED ORDER — OXYTOCIN BOLUS FROM INFUSION
500.0000 mL | Freq: Once | INTRAVENOUS | Status: AC
  Administered 2016-03-09: 500 mL via INTRAVENOUS

## 2016-03-08 MED ORDER — LACTATED RINGERS IV SOLN: 500.0000 mL | INTRAVENOUS | Status: DC | PRN

## 2016-03-08 MED ORDER — LACTATED RINGERS IV SOLN
INTRAVENOUS | Status: DC
  Administered 2016-03-08 – 2016-03-09 (×3): via INTRAVENOUS

## 2016-03-08 MED ORDER — FENTANYL CITRATE (PF) 100 MCG/2ML IJ SOLN
50.0000 ug | INTRAMUSCULAR | Status: DC | PRN
  Administered 2016-03-09: 50 ug via INTRAVENOUS
  Administered 2016-03-09 (×2): 100 ug via INTRAVENOUS
  Filled 2016-03-08 (×3): qty 2

## 2016-03-08 MED ORDER — OXYTOCIN 40 UNITS IN LACTATED RINGERS INFUSION - SIMPLE MED
2.5000 [IU]/h | INTRAVENOUS | Status: DC
  Administered 2016-03-09: 2.5 [IU]/h via INTRAVENOUS

## 2016-03-08 MED ORDER — ZOLPIDEM TARTRATE 5 MG PO TABS
5.0000 mg | ORAL_TABLET | Freq: Every evening | ORAL | Status: DC | PRN
  Administered 2016-03-09: 5 mg via ORAL
  Filled 2016-03-08: qty 1

## 2016-03-08 MED ORDER — LIDOCAINE HCL (PF) 1 % IJ SOLN: 30.0000 mL | INTRAMUSCULAR | Status: DC | PRN

## 2016-03-08 NOTE — H&P (Signed)
Obstetric History and Physical  Evelyn Caldwell is a 25 y.o. G1P0 with IUP at [redacted]w[redacted]d presenting for IOL. Patient states she has been having  none contractions, none vaginal bleeding, intact membranes, with active fetal movement.    Prenatal Course Source of Care: [facility]  Pregnancy complications or risks:anemia, renal stones (passed one four days ago)  Prenatal labs and studies: ABO, Rh: --/--/PENDING (10/09 2114)A+ Antibody: PENDING (10/09 2114)negative Rubella: <20.0 (02/28 1451) RPR: Non Reactive (02/28 1451)  HBsAg: Negative (02/28 1451)  HIV: Non Reactive (02/28 1451)  [id number] (09/05 1209) 1 hr Glucola  normal Genetic screening normal Anatomy US normal  Past Medical History:  Diagnosis Date  . Kidney stone   . Vulvar lesion     Past Surgical History:  Procedure Laterality Date  . none      OB History  Gravida Para Term Preterm AB Living  1            SAB TAB Ectopic Multiple Live Births               # Outcome Date GA Lbr Len/2nd Weight Sex Delivery Anes PTL Lv  1 Current               Social History   Social History  . Marital status: Married    Spouse name: N/A  . Number of children: N/A  . Years of education: N/A   Social History Main Topics  . Smoking status: Never Smoker  . Smokeless tobacco: Never Used  . Alcohol use No     Comment: occasionally  . Drug use: No  . Sexual activity: Yes    Birth control/ protection: None   Other Topics Concern  . Not on file   Social History Narrative  . No narrative on file    Family History  Problem Relation Age of Onset  . Congestive Heart Failure Maternal Grandfather   . Parkinson's disease Paternal Grandmother   . Kidney failure Cousin   . Kidney cancer Neg Hx   . Prostate cancer Neg Hx     Prescriptions Prior to Admission  Medication Sig Dispense Refill Last Dose  . HYDROmorphone (DILAUDID) 4 MG tablet Take 1 tablet (4 mg total) by mouth every 4 (four) hours as needed for moderate  pain or severe pain. (Patient not taking: Reported on 03/02/2016) 10 tablet 0 Not Taking  . Iron-FA-B Cmp-C-Biot-Probiotic (FUSION PLUS) CAPS Take 1 capsule by mouth daily. 60 capsule 1 Taking  . ondansetron (ZOFRAN ODT) 4 MG disintegrating tablet Take 1 tablet (4 mg total) by mouth every 6 (six) hours as needed for nausea. (Patient not taking: Reported on 03/02/2016) 20 tablet 0 Not Taking  . Prenatal Vit-Fe Fumarate-FA (PRENATAL MULTIVITAMIN) TABS tablet Take 1 tablet by mouth daily at 12 noon.   Not Taking  . zolpidem (AMBIEN) 5 MG tablet Take 1 tablet (5 mg total) by mouth at bedtime as needed for sleep. (Patient not taking: Reported on 03/02/2016) 30 tablet 1 Not Taking    No Known Allergies  Review of Systems: Negative except for what is mentioned in HPI.  Physical Exam: LMP 05/25/2015 (Exact Date)  GENERAL: Well-developed, well-nourished female in no acute distress.  LUNGS: Clear to auscultation bilaterally.  HEART: Regular rate and rhythm. ABDOMEN: Soft, nontender, nondistended, gravid. EXTREMITIES: Nontender, no edema, 2+ distal pulses. Cervical Exam:   FHT:  Baseline rate 130 bpm   Variability moderate  Accelerations present   Decelerations none Contractions: none notable  Pertinent Labs/Studies:   Results for orders placed or performed during the hospital encounter of 03/08/16 (from the past 24 hour(s))  CBC     Status: Abnormal   Collection Time: 03/08/16  9:14 PM  Result Value Ref Range   WBC 8.8 3.6 - 11.0 K/uL   RBC 3.55 (L) 3.80 - 5.20 MIL/uL   Hemoglobin 10.1 (L) 12.0 - 16.0 g/dL   HCT 28.7 (L) 35.0 - 47.0 %   MCV 81.0 80.0 - 100.0 fL   MCH 28.4 26.0 - 34.0 pg   MCHC 35.1 32.0 - 36.0 g/dL   RDW 13.8 11.5 - 14.5 %   Platelets 242 150 - 440 K/uL  Type and screen     Status: None (Preliminary result)   Collection Time: 03/08/16  9:14 PM  Result Value Ref Range   ABO/RH(D) PENDING    Antibody Screen PENDING    Sample Expiration 03/11/2016     Assessment  : Evelyn Caldwell is a 25 y.o. G1P0 at [redacted]w[redacted]d being admitted for labor.  Plan: Labor: Expectant management.  Induction/Augmentation as needed, per protocol FWB: Reassuring fetal heart tracing.  GBS negative Delivery plan: Hopeful for vaginal delivery  Elanna Bert, CNM [facility]

## 2016-03-09 ENCOUNTER — Inpatient Hospital Stay: Payer: PRIVATE HEALTH INSURANCE | Admitting: Anesthesiology

## 2016-03-09 DIAGNOSIS — Z8759 Personal history of other complications of pregnancy, childbirth and the puerperium: Secondary | ICD-10-CM

## 2016-03-09 DIAGNOSIS — Z3403 Encounter for supervision of normal first pregnancy, third trimester: Secondary | ICD-10-CM

## 2016-03-09 HISTORY — DX: Personal history of other complications of pregnancy, childbirth and the puerperium: Z87.59

## 2016-03-09 LAB — TYPE AND SCREEN
ABO/RH(D): A POS
Antibody Screen: NEGATIVE

## 2016-03-09 MED ORDER — VARICELLA VIRUS VACCINE LIVE 1350 PFU/0.5ML IJ SUSR
0.5000 mL | Freq: Once | INTRAMUSCULAR | Status: AC
  Administered 2016-03-11: 0.5 mL via SUBCUTANEOUS
  Filled 2016-03-09 (×3): qty 0.5

## 2016-03-09 MED ORDER — AMMONIA AROMATIC IN INHA
RESPIRATORY_TRACT | Status: DC
Start: 2016-03-09 — End: 2016-03-09
  Filled 2016-03-09: qty 10

## 2016-03-09 MED ORDER — ACETAMINOPHEN 325 MG PO TABS
650.0000 mg | ORAL_TABLET | ORAL | Status: DC | PRN
Start: 2016-03-09 — End: 2016-03-11

## 2016-03-09 MED ORDER — SENNOSIDES-DOCUSATE SODIUM 8.6-50 MG PO TABS
2.0000 | ORAL_TABLET | ORAL | Status: DC
  Administered 2016-03-10 – 2016-03-11 (×2): 2 via ORAL
  Filled 2016-03-09 (×3): qty 2

## 2016-03-09 MED ORDER — MISOPROSTOL 200 MCG PO TABS
ORAL_TABLET | ORAL | Status: DC
Start: 2016-03-09 — End: 2016-03-09
  Filled 2016-03-09: qty 4

## 2016-03-09 MED ORDER — OXYCODONE HCL 5 MG PO TABS: 5.0000 mg | ORAL_TABLET | ORAL | Status: DC | PRN

## 2016-03-09 MED ORDER — FENTANYL 2.5 MCG/ML W/ROPIVACAINE 0.2% IN NS 100 ML EPIDURAL INFUSION (ARMC-ANES)
EPIDURAL | Status: DC | PRN
  Administered 2016-03-09: 10 mL/h via EPIDURAL

## 2016-03-09 MED ORDER — BUPIVACAINE HCL (PF) 0.25 % IJ SOLN
INTRAMUSCULAR | Status: DC | PRN
  Administered 2016-03-09: 10 mL via EPIDURAL

## 2016-03-09 MED ORDER — ZOLPIDEM TARTRATE 5 MG PO TABS: 5.0000 mg | ORAL_TABLET | Freq: Every evening | ORAL | Status: DC | PRN

## 2016-03-09 MED ORDER — IBUPROFEN 600 MG PO TABS
600.0000 mg | ORAL_TABLET | Freq: Four times a day (QID) | ORAL | Status: DC
  Administered 2016-03-10 – 2016-03-11 (×6): 600 mg via ORAL
  Filled 2016-03-09 (×6): qty 1

## 2016-03-09 MED ORDER — FENTANYL 2.5 MCG/ML W/ROPIVACAINE 0.2% IN NS 100 ML EPIDURAL INFUSION (ARMC-ANES)
EPIDURAL | Status: AC
  Filled 2016-03-09: qty 100

## 2016-03-09 MED ORDER — LIDOCAINE-EPINEPHRINE (PF) 1.5 %-1:200000 IJ SOLN
INTRAMUSCULAR | Status: DC | PRN
  Administered 2016-03-09: 3 mL via PERINEURAL

## 2016-03-09 MED ORDER — FENTANYL 2.5 MCG/ML W/ROPIVACAINE 0.2% IN NS 100 ML EPIDURAL INFUSION (ARMC-ANES): EPIDURAL | Status: DC | PRN

## 2016-03-09 MED ORDER — LIDOCAINE HCL (PF) 1 % IJ SOLN
INTRAMUSCULAR | Status: AC
  Filled 2016-03-09: qty 30

## 2016-03-09 MED ORDER — LIDOCAINE HCL (PF) 1 % IJ SOLN
INTRAMUSCULAR | Status: DC | PRN
  Administered 2016-03-09: 3 mL

## 2016-03-09 MED ORDER — OXYCODONE HCL 5 MG PO TABS: 10.0000 mg | ORAL_TABLET | ORAL | Status: DC | PRN

## 2016-03-09 MED ORDER — MEASLES, MUMPS & RUBELLA VAC ~~LOC~~ INJ
0.5000 mL | INJECTION | Freq: Once | SUBCUTANEOUS | Status: AC
  Administered 2016-03-11: 0.5 mL via SUBCUTANEOUS
  Filled 2016-03-09 (×3): qty 0.5

## 2016-03-09 MED ORDER — DIPHENHYDRAMINE HCL 25 MG PO CAPS: 25.0000 mg | ORAL_CAPSULE | Freq: Four times a day (QID) | ORAL | Status: DC | PRN

## 2016-03-09 MED ORDER — LIDOCAINE HCL (PF) 2 % IJ SOLN
INTRAMUSCULAR | Status: DC | PRN
  Administered 2016-03-09: 5 mL via INTRADERMAL

## 2016-03-09 MED ORDER — DIBUCAINE 1 % RE OINT: 1.0000 "application " | TOPICAL_OINTMENT | RECTAL | Status: DC | PRN

## 2016-03-09 MED ORDER — ONDANSETRON HCL 4 MG PO TABS: 4.0000 mg | ORAL_TABLET | ORAL | Status: DC | PRN

## 2016-03-09 MED ORDER — ONDANSETRON HCL 4 MG/2ML IJ SOLN: 4.0000 mg | INTRAMUSCULAR | Status: DC | PRN

## 2016-03-09 MED ORDER — COCONUT OIL OIL
1.0000 "application " | TOPICAL_OIL | Status: DC | PRN
  Administered 2016-03-11: 1 via TOPICAL
  Filled 2016-03-09: qty 120

## 2016-03-09 MED ORDER — SIMETHICONE 80 MG PO CHEW: 80.0000 mg | CHEWABLE_TABLET | ORAL | Status: DC | PRN

## 2016-03-09 MED ORDER — OXYTOCIN 10 UNIT/ML IJ SOLN
INTRAMUSCULAR | Status: AC
  Filled 2016-03-09: qty 2

## 2016-03-09 MED ORDER — BENZOCAINE-MENTHOL 20-0.5 % EX AERO: 1.0000 "application " | INHALATION_SPRAY | CUTANEOUS | Status: DC | PRN

## 2016-03-09 MED ORDER — WITCH HAZEL-GLYCERIN EX PADS: 1.0000 "application " | MEDICATED_PAD | CUTANEOUS | Status: DC | PRN

## 2016-03-09 MED ORDER — PRENATAL MULTIVITAMIN CH
1.0000 | ORAL_TABLET | Freq: Every day | ORAL | Status: DC
Start: 2016-03-10 — End: 2016-03-11
  Administered 2016-03-10 – 2016-03-11 (×2): 1 via ORAL
  Filled 2016-03-09 (×2): qty 1

## 2016-03-09 NOTE — Progress Notes (Signed)
Evelyn Caldwell is a 25 y.o. G1P0 at [redacted]w[redacted]d by LMP admitted for induction of labor due to postdates.  Subjective: Reports pressure with contractions  Objective: BP 118/65   Pulse 86   Temp 99.3 F (37.4 C) (Oral)   Resp 20   Ht 5\' 8"  (1.727 m)   Wt 194 lb (88 kg)   LMP 05/25/2015 (Exact Date)   SpO2 99%   BMI 29.50 kg/m  No intake/output data recorded. No intake/output data recorded.  FHT:  FHR: 148 bpm, variability: moderate,  accelerations:  Present,  decelerations:  Present vaiable with late component after each episode of pushing. UC:   regular, every 2-3 minutes SVE:   Dilation: 9 Effacement (%): 90 Station: +1 Exam by:: JFS  Labs: Lab Results  Component Value Date   WBC 8.8 03/08/2016   HGB 10.1 (L) 03/08/2016   HCT 28.7 (L) 03/08/2016   MCV 81.0 03/08/2016   PLT 242 03/08/2016    Assessment / Plan: second stage of labor, fetal decelerations, begin pushing  Labor: Progressing normally Preeclampsia:  labs stable Fetal Wellbeing:  Category I Pain Control:  Epidural I/D:  n/a Anticipated MOD:  NSVD  Evelyn Caldwell, CNM 03/09/2016, 3:43 PM

## 2016-03-09 NOTE — Progress Notes (Signed)
Evelyn Caldwell is a 25 y.o. G1P0 at [redacted]w[redacted]d by LMP admitted for induction of labor due to Post dates.  Subjective: Denies pain since epidural palced.  Objective: BP (!) 115/58   Pulse 68   Temp 98 F (36.7 C) (Oral)   Resp 20   Ht 5\' 8"  (1.727 m)   Wt 194 lb (88 kg)   LMP 05/25/2015 (Exact Date)   SpO2 97%   BMI 29.50 kg/m  No intake/output data recorded. No intake/output data recorded.  FHT:  FHR: 148 bpm, variability: moderate,  accelerations:  Present,  decelerations:  Present variable UC:   regular, every 3-5 minutes SVE:   7.5/80/-2 Labs: Lab Results  Component Value Date   WBC 8.8 03/08/2016   HGB 10.1 (L) 03/08/2016   HCT 28.7 (L) 03/08/2016   MCV 81.0 03/08/2016   PLT 242 03/08/2016    Assessment / Plan: Spontaneous labor, progressing normally  Labor: Progressing normally Preeclampsia:  labs stable Fetal Wellbeing:  Category I Pain Control:  Epidural I/D:  n/a Anticipated MOD:  NSVD  Cozette Braggs N Yahshua Thibault, CNM 03/09/2016, 9:26 AM

## 2016-03-09 NOTE — Anesthesia Procedure Notes (Signed)
Epidural Patient location during procedure: OB Start time: 03/09/2016 8:22 AM End time: 03/09/2016 8:35 AM  Staffing Anesthesiologist: Alvin Critchley Performed: anesthesiologist   Preanesthetic Checklist Completed: patient identified, site marked, surgical consent, pre-op evaluation, timeout performed, IV checked, risks and benefits discussed and monitors and equipment checked  Epidural Patient position: sitting Prep: Betadine and site prepped and draped Patient monitoring: heart rate, cardiac monitor, continuous pulse ox and blood pressure Approach: midline Location: L3-L4 Injection technique: LOR air  Needle:  Needle type: Tuohy  Needle gauge: 17 G Needle length: 9 cm Needle insertion depth: 6 cm Catheter type: closed end flexible Catheter size: 19 Gauge Test dose: negative and 1.5% lidocaine with Epi 1:200 K  Assessment Sensory level: T8  Additional Notes Time out called.  Patient placed in sitting position.  Back prepped and draped in sterile fashion.  A skin wheal was made with 1% Lidocaine plain.  A 17G Tuohy needle was advanced to the epidural space by a loss of resistance technique.  The catheter threaded easily 3 cm into the space and the TD was negative.  No blood or paresthesias and the patient tolerated the procedure well.  The catheter was affixed to the back in sterile fashion.Reason for block:procedure for pain

## 2016-03-10 LAB — CBC
HCT: 26.2 % — ABNORMAL LOW (ref 35.0–47.0)
Hemoglobin: 8.9 g/dL — ABNORMAL LOW (ref 12.0–16.0)
MCH: 28.1 pg (ref 26.0–34.0)
MCHC: 34 g/dL (ref 32.0–36.0)
MCV: 82.6 fL (ref 80.0–100.0)
Platelets: 178 10*3/uL (ref 150–440)
RBC: 3.18 MIL/uL — ABNORMAL LOW (ref 3.80–5.20)
RDW: 14.3 % (ref 11.5–14.5)
WBC: 11.3 10*3/uL — ABNORMAL HIGH (ref 3.6–11.0)

## 2016-03-10 LAB — RPR: RPR Ser Ql: NONREACTIVE

## 2016-03-10 MED ORDER — FE FUMARATE-B12-VIT C-FA-IFC PO CAPS
1.0000 | ORAL_CAPSULE | Freq: Three times a day (TID) | ORAL | Status: DC
  Administered 2016-03-10 – 2016-03-11 (×3): 1 via ORAL
  Filled 2016-03-10 (×5): qty 1

## 2016-03-10 NOTE — Anesthesia Postprocedure Evaluation (Signed)
Anesthesia Post Note  Patient: Evelyn Caldwell  Procedure(s) Performed: * No procedures listed *  Patient location during evaluation: Mother Baby Anesthesia Type: Epidural Level of consciousness: awake and alert Pain management: pain level controlled Vital Signs Assessment: post-procedure vital signs reviewed and stable Respiratory status: spontaneous breathing Cardiovascular status: stable Postop Assessment: no headache, no backache and patient able to bend at knees Anesthetic complications: no    Last Vitals:  Vitals:   03/09/16 1959 03/09/16 2352  BP: 127/65 116/69  Pulse: 79 75  Resp: 18 18  Temp: 36.8 C 36.8 C    Last Pain:  Vitals:   03/09/16 2352  TempSrc: Oral  PainSc:                  Brantley Fling

## 2016-03-10 NOTE — Progress Notes (Signed)
Post Partum Day 1 Subjective: no complaints, up ad lib and voiding  Objective: Blood pressure 116/69, pulse 75, temperature 98.3 F (36.8 C), temperature source Oral, resp. rate 18, height 5\' 8"  (1.727 m), weight 194 lb (88 kg), last menstrual period 05/25/2015, SpO2 97 %.  Physical Exam:  General: alert, cooperative, appears stated age and pale Lochia: appropriate Uterine Fundus: firm Incision: NA DVT Evaluation: No evidence of DVT seen on physical exam. Negative Homan's sign. Calf/Ankle edema is present.   Recent Labs  03/08/16 2114 03/10/16 0441  HGB 10.1* 8.9*  HCT 28.7* 26.2*    Assessment/Plan: Plan for discharge tomorrow and Breastfeeding   LOS: 2 days   Brittnay Pigman N Jousha Schwandt 03/10/2016, 7:30 AM

## 2016-03-11 MED ORDER — INFLUENZA VAC SPLIT QUAD 0.5 ML IM SUSY
0.5000 mL | PREFILLED_SYRINGE | Freq: Once | INTRAMUSCULAR | Status: AC
  Administered 2016-03-11: 0.5 mL via INTRAMUSCULAR
  Filled 2016-03-11: qty 0.5

## 2016-03-11 MED ORDER — VITAMIN D3 125 MCG (5000 UT) PO CAPS: 1.0000 | ORAL_CAPSULE | Freq: Every day | ORAL | 2 refills | Status: DC

## 2016-03-11 MED ORDER — NORETHINDRONE 0.35 MG PO TABS: 1.0000 | ORAL_TABLET | Freq: Every day | ORAL | 11 refills | Status: DC

## 2016-03-11 MED ORDER — INFLUENZA VAC SPLIT QUAD 0.5 ML IM SUSY: 0.5000 mL | PREFILLED_SYRINGE | INTRAMUSCULAR | Status: DC

## 2016-03-11 NOTE — Discharge Summary (Signed)
OB Discharge Summary     Patient Name: Evelyn Caldwell DOB: 12-18-1990 MRN: [id number]  Date of admission: 03/08/2016 Delivering MD: Joylene Igo   Date of discharge: 03/11/2016  Admitting diagnosis: 59 weeks for induction Intrauterine pregnancy: [redacted]w[redacted]d     Secondary diagnosis:  Active Problems:   Personal history of previous postdates pregnancy   Status post vacuum-assisted vaginal delivery  Additional problems: anemia     Discharge diagnosis: Term Pregnancy Delivered, Anemia and vacuum assisted vaginal delivery                                                                                                Post partum procedures:Varicella and Rubella Vaccines  Augmentation: Pitocin  Complications: None  Hospital course:  Onset of Labor With Vaginal Delivery     25 y.o. yo G1P0 at [redacted]w[redacted]d was admitted in Latent Labor on 03/08/2016. Patient had an uncomplicated labor course as follows:  Membrane Rupture Time/Date: 3:42 AM ,03/09/2016   Intrapartum Procedures: Episiotomy: None [1]                                         Lacerations:  2nd degree [3]  Patient had a delivery of a Viable infant. 03/09/2016  Information for the patient's newborn:  Bich, Franca [id number]  Delivery Method: Vag-Spont    Pateint had an uncomplicated postpartum course.  She is ambulating, tolerating a regular diet, passing flatus, and urinating well. Patient is discharged home in stable condition on 03/11/16.    Physical exam Vitals:   03/10/16 1552 03/10/16 1943 03/10/16 2317 03/11/16 0749  BP: 126/68 117/68 134/68 117/63  Pulse: 84 81 88 75  Resp: 16 18 18 20   Temp: 98.5 F (36.9 C) 98.1 F (36.7 C) 98.7 F (37.1 C) 97.8 F (36.6 C)  TempSrc: Oral Oral Oral Oral  SpO2:      Weight:      Height:       General: alert, cooperative and no distress Lochia: appropriate Uterine Fundus: firm Incision: N/A DVT Evaluation: No evidence of DVT seen on physical  exam. Negative Homan's sign. Calf/Ankle edema is present Labs: Lab Results  Component Value Date   WBC 11.3 (H) 03/10/2016   HGB 8.9 (L) 03/10/2016   HCT 26.2 (L) 03/10/2016   MCV 82.6 03/10/2016   PLT 178 03/10/2016   CMP Latest Ref Rng & Units 01/24/2015  Glucose 65 - 99 mg/dL 95  BUN 6 - 20 mg/dL 13  Creatinine 0.57 - 1.00 mg/dL 0.58  Sodium 134 - 144 mmol/L 139  Potassium 3.5 - 5.2 mmol/L 3.9  Chloride 97 - 108 mmol/L 101  CO2 18 - 29 mmol/L 22  Calcium 8.7 - 10.2 mg/dL 8.7  Total Protein 6.0 - 8.5 g/dL 6.7  Total Bilirubin 0.0 - 1.2 mg/dL 0.4  Alkaline Phos 39 - 117 IU/L 78  AST 0 - 40 IU/L 23  ALT 0 - 32 IU/L 24    Discharge instruction: per After Visit Summary  and "Baby and Me Booklet".  After visit meds:    Medication List    STOP taking these medications   HYDROmorphone 4 MG tablet Commonly known as:  DILAUDID   ondansetron 4 MG disintegrating tablet Commonly known as:  ZOFRAN ODT   zolpidem 5 MG tablet Commonly known as:  AMBIEN     TAKE these medications   FUSION PLUS Caps Take 1 capsule by mouth daily.   norethindrone 0.35 MG tablet Commonly known as:  MICRONOR,CAMILA,ERRIN Take 1 tablet (0.35 mg total) by mouth daily.   prenatal multivitamin Tabs tablet Take 1 tablet by mouth daily at 12 noon.   Vitamin D3 5000 units Caps Take 1 capsule (5,000 Units total) by mouth daily.       Diet: routine diet  Activity: Advance as tolerated. Pelvic rest for 6 weeks.   Outpatient follow up:6 weeks Follow up Appt:Future Appointments Date Time Provider [place]  04/20/2016 11:00 AM Nolan Tuazon Rockney Ghee, CNM [facility] None  05/03/2016 11:00 AM [facility] None   Follow up Visit:No Follow-up on file.  Postpartum contraception: Progesterone only pills  Newborn Data: Live born female  Birth Weight: 8 lb 1.1 oz (3660 g) APGAR: 7, 9  Baby Feeding: Breast Disposition:home with mother   03/11/2016 Joylene Igo,  CNM

## 2016-03-11 NOTE — Anesthesia Preprocedure Evaluation (Signed)
Anesthesia Evaluation  Patient identified by MRN, date of birth, ID band Patient awake    Reviewed: Allergy & Precautions, H&P , NPO status , Patient's Chart, lab work & pertinent test results  Airway Mallampati: III  TM Distance: >3 FB Neck ROM: full    Dental  (+) Poor Dentition   Pulmonary neg pulmonary ROS,    Pulmonary exam normal breath sounds clear to auscultation       Cardiovascular Exercise Tolerance: Good negative cardio ROS Normal cardiovascular exam Rhythm:regular Rate:Normal     Neuro/Psych  Headaches,    GI/Hepatic negative GI ROS,   Endo/Other    Renal/GU Renal disease  negative genitourinary   Musculoskeletal   Abdominal   Peds  Hematology negative hematology ROS (+)   Anesthesia Other Findings Past Medical History: No date: Kidney stone No date: Vulvar lesion  Past Surgical History: No date: none  BMI    Body Mass Index:  29.50 kg/m      Reproductive/Obstetrics (+) Pregnancy                             Anesthesia Physical Anesthesia Plan  ASA: II  Anesthesia Plan: Epidural   Post-op Pain Management:    Induction:   Airway Management Planned:   Additional Equipment:   Intra-op Plan:   Post-operative Plan:   Informed Consent: I have reviewed the patients History and Physical, chart, labs and discussed the procedure including the risks, benefits and alternatives for the proposed anesthesia with the patient or authorized representative who has indicated his/her understanding and acceptance.     Plan Discussed with: Anesthesiologist  Anesthesia Plan Comments:         Anesthesia Quick Evaluation

## 2016-03-11 NOTE — Progress Notes (Signed)
Patient discharged home with infant and significant other. Discharge instructions, prescriptions and follow up appointment given to and reviewed with patient and significant other. Patient verbalized understanding. Escorted out via wheelchair by axiliary.  

## 2016-03-22 ENCOUNTER — Telehealth: Payer: Self-pay | Admitting: Obstetrics and Gynecology

## 2016-03-22 NOTE — Telephone Encounter (Signed)
Called pt she states she just feels really bad,"feels like a truck has hit her", chills, fever, we discussed mastitis sx, advised pt to keep taking motrin, she is going to see Dr. Keturah Barre 03/23/16 @ 8:00am

## 2016-03-22 NOTE — Telephone Encounter (Signed)
Pt called and she is 2 weeks post paratum and she started running a fever yesterday and she is has continued on in to today, she is taking motrin, some breast pain and abdominal pain, she doesn't feel that she is getting mastitis, no redness, she isn't sure what to do and what is going on, pt would like a call back.

## 2016-03-23 ENCOUNTER — Ambulatory Visit (INDEPENDENT_AMBULATORY_CARE_PROVIDER_SITE_OTHER): Payer: PRIVATE HEALTH INSURANCE | Admitting: Obstetrics and Gynecology

## 2016-03-23 ENCOUNTER — Encounter: Payer: Self-pay | Admitting: Obstetrics and Gynecology

## 2016-03-23 VITALS — BP 126/74 | HR 123 | Temp 98.4°F | Ht 68.0 in | Wt 163.4 lb

## 2016-03-23 DIAGNOSIS — N61 Mastitis without abscess: Secondary | ICD-10-CM | POA: Diagnosis not present

## 2016-03-23 MED ORDER — DICLOXACILLIN SODIUM 500 MG PO CAPS: 500.0000 mg | ORAL_CAPSULE | Freq: Four times a day (QID) | ORAL | 0 refills | Status: DC

## 2016-03-23 NOTE — Patient Instructions (Signed)
1. Dicloxacillin 500 mg 4 times a day for 14 days 2. Maintain oral hydration 3. Maintain adequate nutritional intake 4. Return in 2 weeks for follow-up or sooner if focal signs of infection elsewhere develop

## 2016-03-23 NOTE — Progress Notes (Signed)
GYN ENCOUNTER NOTE  Subjective:       Evelyn Caldwell is a 25 y.o. G47P1001 female is here for gynecologic evaluation of the following issues:  1. 2 weeks postpartum with fever (high of 102.1), chills, body aches and headache. Pt has occasional abdominal cramping mostly with movement and decreased appetite. Denies cough, runny nose, diarrhea, nausea, vomiting, breast pain/redness, burning with urination or focal abdominal pain. Has tried OTC motrin with minor relief and is still taking stool softener for bowels. Flu shot given upon leaving hospital after childbirth.    Gynecologic History No LMP recorded. Contraception: oral progesterone-only contraceptive and breastfeeding   Obstetric History OB History  Gravida Para Term Preterm AB Living  1 1 1     1   SAB TAB Ectopic Multiple Live Births          1    # Outcome Date GA Lbr Len/2nd Weight Sex Delivery Anes PTL Lv  1 Term 03/09/16 [redacted]w[redacted]d  8 lb 1.6 oz (3.674 kg) F Vag-Spont   LIV      Past Medical History:  Diagnosis Date  . Kidney stone   . Vulvar lesion     Past Surgical History:  Procedure Laterality Date  . none      Current Outpatient Prescriptions on File Prior to Visit  Medication Sig Dispense Refill  . Cholecalciferol (VITAMIN D3) 5000 units CAPS Take 1 capsule (5,000 Units total) by mouth daily. 120 capsule 2  . norethindrone (MICRONOR,CAMILA,ERRIN) 0.35 MG tablet Take 1 tablet (0.35 mg total) by mouth daily. 1 Package 11  . Prenatal Vit-Fe Fumarate-FA (PRENATAL MULTIVITAMIN) TABS tablet Take 1 tablet by mouth daily at 12 noon.     No current facility-administered medications on file prior to visit.     No Known Allergies  Social History   Social History  . Marital status: Married    Spouse name: N/A  . Number of children: N/A  . Years of education: N/A   Occupational History  . Not on file.   Social History Main Topics  . Smoking status: Never Smoker  . Smokeless tobacco: Never Used  .  Alcohol use No     Comment: occasionally  . Drug use: No  . Sexual activity: Yes    Birth control/ protection: None   Other Topics Concern  . Not on file   Social History Narrative  . No narrative on file    Family History  Problem Relation Age of Onset  . Congestive Heart Failure Maternal Grandfather   . Parkinson's disease Paternal Grandmother   . Kidney failure Cousin   . Kidney cancer Neg Hx   . Prostate cancer Neg Hx     The following portions of the patient's history were reviewed and updated as appropriate: allergies, current medications, past family history, past medical history, past social history, past surgical history and problem list.  Review of Systems Review of Systems -Per history of present illness Objective:   BP 126/74   Pulse (!) 123   Temp 98.4 F (36.9 C)   Ht 5\' 8"  (1.727 m)   Wt 163 lb 6.4 oz (74.1 kg)   Breastfeeding? Yes   BMI 24.84 kg/m  CONSTITUTIONAL: Well-developed, well-nourished female in no acute distress.  HENT:  Normocephalic, atraumatic.  NECK: Normal range of motion, supple, no masses.  Normal thyroid.  SKIN: Skin is warm and dry. No rash noted. Not diaphoretic. No erythema. No pallor. [place]: Alert and oriented to person, place, and time.  PSYCHIATRIC: Normal mood and affect. Normal behavior. Normal judgment and thought content. CARDIOVASCULAR: RRR, no murmurs, rubs or gallops RESPIRATORY: CTABL, no accessory muscle use, rales, rhonchi or crackles BREASTS: soft, full and nontender B/L with no masses or lumps noted; no cracks or injury to B/L nipples; no signs of infection; Lactation changes noted ABDOMEN: Soft, non distended; slight pelvic tenderness.  No Organomegaly.   Assessment:   Differential includes mastitis vs. Viral syndrome vs. Flu- no concerning signs on breast exam but can not rule out early mastitis.   Plan:  Oral Dicloxacillin for possible early mastitis. 500 mg 4 times a day for 14 days Encourage fluids,  healthy diet and rest.  Continue breastfeeding.  RTC if any focal abdominal pain, pain with urination or worsening of symptoms.  A total of 15 minutes were spent face-to-face with the patient during this encounter and over half of that time dealt with counseling and coordination of care.  Alla German, PA-S Brayton Mars, MD   I have seen, interviewed, and examined the patient in conjunction with the [facility].A. student and affirm the diagnosis and management plan. Kerah Hardebeck A. Iaan Oregel, MD, FACOG   Note: This dictation was prepared with Dragon dictation along with smaller phrase technology. Any transcriptional errors that result from this process are unintentional.

## 2016-04-06 ENCOUNTER — Ambulatory Visit (INDEPENDENT_AMBULATORY_CARE_PROVIDER_SITE_OTHER): Payer: PRIVATE HEALTH INSURANCE | Admitting: Obstetrics and Gynecology

## 2016-04-06 VITALS — BP 104/66 | HR 76 | Wt 161.8 lb

## 2016-04-06 DIAGNOSIS — Z9189 Other specified personal risk factors, not elsewhere classified: Secondary | ICD-10-CM | POA: Diagnosis not present

## 2016-04-06 MED ORDER — METOCLOPRAMIDE HCL 10 MG PO TABS: 10.0000 mg | ORAL_TABLET | Freq: Three times a day (TID) | ORAL | 1 refills | Status: DC

## 2016-04-06 NOTE — Progress Notes (Signed)
Subjective:     Patient ID: Evelyn Caldwell, female   DOB: 1990/06/14, 25 y.o.   MRN: [id number]  HPI Patient here for follow up of possible mastitis (seen here for fever, chills, body aches 2 weeks pp). States all symptoms have resolved. She has one day of antibiotics left after today and have tolerated that well. Continues to breastfeed but has been taking [place] to increase milk supply since 10/27.   Review of Systems  All negative except for what is mentioned in HPI     Objective:   Physical Exam Blood pressure 104/66, pulse 76, weight 161 lb 12.8 oz (73.4 kg), currently breastfeeding. A&O x4 Well groomed. Pleasant.  Lung sounds clear and even bilateral Heart sounds heard with normal rate and rhythm Breasts with no redness and nipples without trauma. Patient breastfeeding so palpation deferred.    Assessment:     Hx of Early Mastitis Decreased Milk Supply    Plan:     Patient will finish course of antibiotics. Reglan to increase milk supply as needed. Follow up as scheduled for 6 week postpartum check    Thea Gist, RN, Student FNP,   I attest to the above assessment and have seen this patient with the student. >50% of visit spent in counseling and medication management.  Melody [place], CNM

## 2016-04-20 ENCOUNTER — Encounter: Payer: Self-pay | Admitting: Obstetrics and Gynecology

## 2016-04-20 ENCOUNTER — Ambulatory Visit (INDEPENDENT_AMBULATORY_CARE_PROVIDER_SITE_OTHER): Payer: PRIVATE HEALTH INSURANCE | Admitting: Obstetrics and Gynecology

## 2016-04-20 NOTE — Patient Instructions (Signed)
  Place postpartum visit patient instructions here.  

## 2016-04-20 NOTE — Progress Notes (Signed)
   Subjective:     Evelyn Caldwell is a 25 y.o. female who presents for a postpartum visit. She is 6 weeks postpartum following a spontaneous vaginal delivery. I have fully reviewed the prenatal and intrapartum course. The delivery was at 67 gestational weeks. Outcome: spontaneous vaginal delivery and vaccuum assisted, outlet. Anesthesia: epidural. Postpartum course has been complicated with mastitis. Baby's course has been uncomplicated. Baby is feeding by breast. Bleeding intermittent, stopped and then has started back. Bowel function is normal Bladder function is normal. Patient is sexually active. Contraception method is oral progesterone-only contraceptive. Postpartum depression screening: negative.  The following portions of the patient's history were reviewed and updated as appropriate: allergies, current medications, past family history, past medical history, past social history, past surgical history and problem list.  Review of Systems Pertinent items noted in HPI and remainder of comprehensive ROS otherwise negative.   Objective:    BP 125/76   Pulse 81   Ht 5\' 7"  (1.702 m)   Wt 163 lb 14.4 oz (74.3 kg)   Breastfeeding? Yes   BMI 25.67 kg/m   General:  alert, cooperative and appears stated age   Breasts:  inspection negative, no nipple discharge or bleeding, no masses or nodularity palpable  Lungs: clear to auscultation bilaterally  Heart:  regular rate and rhythm, S1, S2 normal, no murmur, click, rub or gallop  Abdomen: soft, non-tender; bowel sounds normal; no masses,  no organomegaly   Vulva:  normal  Vagina: normal vagina and bloody discharge present  Cervix:  multiparous appearance  Corpus: retroflexed, tender and to the left  Adnexa:  normal adnexa  Rectal Exam: Not performed.        Assessment:    Normal postpartum exam, bleeding still present. Pap smear not done at today's visit.   Plan:    1. Contraception: oral progesterone-only contraceptive 2. To  stop bleeding, patient instructed to double up on OCP for the next two weeks 3.Labs obtaine 4. Follow up in: 4 months for AE or as needed.    Thea Gist, RN, Student FNP

## 2016-04-21 LAB — VITAMIN D 25 HYDROXY (VIT D DEFICIENCY, FRACTURES): Vit D, 25-Hydroxy: 65.9 ng/mL (ref 30.0–100.0)

## 2016-04-21 LAB — CBC
Hematocrit: 34.4 % (ref 34.0–46.6)
Hemoglobin: 11.2 g/dL (ref 11.1–15.9)
MCH: 25.7 pg — ABNORMAL LOW (ref 26.6–33.0)
MCHC: 32.6 g/dL (ref 31.5–35.7)
MCV: 79 fL (ref 79–97)
Platelets: 233 10*3/uL (ref 150–379)
RBC: 4.36 x10E6/uL (ref 3.77–5.28)
RDW: 15.5 % — ABNORMAL HIGH (ref 12.3–15.4)
WBC: 6.3 10*3/uL (ref 3.4–10.8)

## 2016-04-21 LAB — IRON: Iron: 44 ug/dL (ref 27–159)

## 2016-05-03 ENCOUNTER — Ambulatory Visit: Payer: PRIVATE HEALTH INSURANCE

## 2016-06-06 ENCOUNTER — Encounter: Payer: Self-pay | Admitting: Obstetrics and Gynecology

## 2016-06-20 ENCOUNTER — Encounter: Payer: Self-pay | Admitting: Obstetrics and Gynecology

## 2016-07-15 ENCOUNTER — Encounter: Payer: Self-pay | Admitting: Obstetrics and Gynecology

## 2016-08-06 ENCOUNTER — Encounter: Payer: Self-pay | Admitting: Obstetrics and Gynecology

## 2016-08-06 ENCOUNTER — Ambulatory Visit (INDEPENDENT_AMBULATORY_CARE_PROVIDER_SITE_OTHER): Payer: BLUE CROSS/BLUE SHIELD | Admitting: Obstetrics and Gynecology

## 2016-08-06 VITALS — BP 104/73 | HR 95 | Ht 67.0 in | Wt 167.5 lb

## 2016-08-06 DIAGNOSIS — N941 Unspecified dyspareunia: Secondary | ICD-10-CM

## 2016-08-06 MED ORDER — DESOGESTREL-ETHINYL ESTRADIOL 0.15-0.02/0.01 MG (21/5) PO TABS: 1.0000 | ORAL_TABLET | Freq: Every day | ORAL | 11 refills | Status: DC

## 2016-08-06 NOTE — Progress Notes (Signed)
Subjective:     Patient ID: [name], female   DOB: March 07, 1991, 26 y.o.   MRN: [id number]  HPI Here due to vaginal pain during intercourse and noting a popping sensation with deep penetration. All pain in area of scar that occurred from repair of vaginal laceration at delivery in October.  Denies bleeding with/after sex. States pain last about a hour after intercourse and occurs in all positions. Just had first menses since delivery and has stopped breast feeding.  Review of Systems Negative except stated above in HPI    Objective:   Physical Exam A&O x4 Well groomed female in no distress Blood pressure 104/73, pulse 95, height 5\' 7"  (1.702 m), weight 167 lb 8 oz (76 kg), last menstrual period 08/03/2016, currently breastfeeding. Pelvic exam: normal external genitalia, vulva, vagina, cervix, uterus and adnexa, VAGINA: normal appearing vagina with normal color and discharge, no lesions, vaginal tenderness along scar line with scar tissue noted.    Assessment:     dysparenia    Plan:     Discussed differnet treatment options, including expectant management, estrogen cream, vaginal PT. Desires trying estrogen cream at this time and will consider PT if the cream doesn't help. Samples of premarin cream given, instructed to massage small amount to area nightly for one month, will follow up at AE next month.   Switch OCPs to combined pills since she is no longer breastfeeding.  Rolene Andrades [place], CNM

## 2016-08-08 ENCOUNTER — Encounter: Payer: Self-pay | Admitting: Obstetrics and Gynecology

## 2016-08-18 ENCOUNTER — Ambulatory Visit (INDEPENDENT_AMBULATORY_CARE_PROVIDER_SITE_OTHER): Payer: BLUE CROSS/BLUE SHIELD | Admitting: Obstetrics and Gynecology

## 2016-08-18 ENCOUNTER — Encounter: Payer: Self-pay | Admitting: Obstetrics and Gynecology

## 2016-08-18 ENCOUNTER — Other Ambulatory Visit: Payer: Self-pay | Admitting: Obstetrics and Gynecology

## 2016-08-18 VITALS — BP 107/63 | HR 85 | Ht 67.0 in | Wt 166.7 lb

## 2016-08-18 DIAGNOSIS — K648 Other hemorrhoids: Secondary | ICD-10-CM

## 2016-08-18 DIAGNOSIS — Z01419 Encounter for gynecological examination (general) (routine) without abnormal findings: Secondary | ICD-10-CM | POA: Diagnosis not present

## 2016-08-18 HISTORY — DX: Other hemorrhoids: K64.8

## 2016-08-18 MED ORDER — TAYTULLA 1-20 MG-MCG(24) PO CAPS: 1.0000 | ORAL_CAPSULE | Freq: Every day | ORAL | 4 refills | Status: DC

## 2016-08-18 MED ORDER — HYDROCORTISONE ACE-PRAMOXINE 1-1 % RE FOAM: 1.0000 | Freq: Two times a day (BID) | RECTAL | 1 refills | Status: DC

## 2016-08-18 NOTE — Progress Notes (Signed)
   Subjective:     Evelyn Caldwell is a 26 y.o. female married caucasian G1P1, and is here for a comprehensive physical exam. The patient reports no problems. Does states hemorrhoids flare up every few days with pain and scant bright red bleeding.  Social History   Social History  . Marital status: Married    Spouse name: N/A  . Number of children: N/A  . Years of education: N/A   Occupational History  . Not on file.   Social History Main Topics  . Smoking status: Never Smoker  . Smokeless tobacco: Never Used  . Alcohol use No     Comment: occasionally  . Drug use: No  . Sexual activity: Yes    Birth control/ protection: None, Pill   Other Topics Concern  . Not on file   Social History Narrative  . No narrative on file   Health Maintenance  Topic Date Due  . PAP SMEAR  11/01/2010  . TETANUS/TDAP  12/15/2025  . INFLUENZA VACCINE  Completed  . HIV Screening  Completed    The following portions of the patient's history were reviewed and updated as appropriate: allergies, current medications, past family history, past medical history, past social history, past surgical history and problem list.  Review of Systems A comprehensive review of systems was negative.   Objective:    General appearance: alert, cooperative and appears stated age Neck: no adenopathy, no carotid bruit, no JVD, supple, symmetrical, trachea midline and thyroid not enlarged, symmetric, no tenderness/mass/nodules Lungs: clear to auscultation bilaterally Breasts: normal appearance, no masses or tenderness Heart: regular rate and rhythm, S1, S2 normal, no murmur, click, rub or gallop Abdomen: soft, non-tender; bowel sounds normal; no masses,  no organomegaly Pelvic: cervix normal in appearance, external genitalia normal, no adnexal masses or tenderness, no cervical motion tenderness, rectovaginal septum normal, uterus normal size, shape, and consistency and vagina normal without discharge     Assessment:    Healthy female exam. Internal hemorrhoid, OCP user     Plan:  OCPs refilled Proctofoam sent in and instructed on use RTC 1 year or as needed   See After Visit Summary for Counseling Recommendations

## 2016-08-18 NOTE — Patient Instructions (Signed)
 Preventive Care 18-39 Years, Female Preventive care refers to lifestyle choices and visits with your health care provider that can promote health and wellness. What does preventive care include?  A yearly physical exam. This is also called an annual well check.  Dental exams once or twice a year.  Routine eye exams. Ask your health care provider how often you should have your eyes checked.  Personal lifestyle choices, including:  Daily care of your teeth and gums.  Regular physical activity.  Eating a healthy diet.  Avoiding tobacco and drug use.  Limiting alcohol use.  Practicing safe sex.  Taking vitamin and mineral supplements as recommended by your health care provider. What happens during an annual well check? The services and screenings done by your health care provider during your annual well check will depend on your age, overall health, lifestyle risk factors, and family history of disease. Counseling  Your health care provider may ask you questions about your:  Alcohol use.  Tobacco use.  Drug use.  Emotional well-being.  Home and relationship well-being.  Sexual activity.  Eating habits.  Work and work environment.  Method of birth control.  Menstrual cycle.  Pregnancy history. Screening  You may have the following tests or measurements:  Height, weight, and BMI.  Diabetes screening. This is done by checking your blood sugar (glucose) after you have not eaten for a while (fasting).  Blood pressure.  Lipid and cholesterol levels. These may be checked every 5 years starting at age 20.  Skin check.  Hepatitis C blood test.  Hepatitis B blood test.  Sexually transmitted disease (STD) testing.  BRCA-related cancer screening. This may be done if you have a family history of breast, ovarian, tubal, or peritoneal cancers.  Pelvic exam and Pap test. This may be done every 3 years starting at age 21. Starting at age 30, this may be done  every 5 years if you have a Pap test in combination with an HPV test. Discuss your test results, treatment options, and if necessary, the need for more tests with your health care provider. Vaccines  Your health care provider may recommend certain vaccines, such as:  Influenza vaccine. This is recommended every year.  Tetanus, diphtheria, and acellular pertussis (Tdap, Td) vaccine. You may need a Td booster every 10 years.  Varicella vaccine. You may need this if you have not been vaccinated.  HPV vaccine. If you are 26 or younger, you may need three doses over 6 months.  Measles, mumps, and rubella (MMR) vaccine. You may need at least one dose of MMR. You may also need a second dose.  Pneumococcal 13-valent conjugate (PCV13) vaccine. You may need this if you have certain conditions and were not previously vaccinated.  Pneumococcal polysaccharide (PPSV23) vaccine. You may need one or two doses if you smoke cigarettes or if you have certain conditions.  Meningococcal vaccine. One dose is recommended if you are age 19-21 years and a first-year college student living in a residence hall, or if you have one of several medical conditions. You may also need additional booster doses.  Hepatitis A vaccine. You may need this if you have certain conditions or if you travel or work in places where you may be exposed to hepatitis A.  Hepatitis B vaccine. You may need this if you have certain conditions or if you travel or work in places where you may be exposed to hepatitis B.  Haemophilus influenzae type b (Hib) vaccine. You may need   this if you have certain risk factors. Talk to your health care provider about which screenings and vaccines you need and how often you need them. This information is not intended to replace advice given to you by your health care provider. Make sure you discuss any questions you have with your health care provider. Document Released: 07/13/2001 Document Revised:  02/04/2016 Document Reviewed: 03/18/2015 Elsevier Interactive Patient Education  2017 Elsevier Inc.  

## 2016-08-20 LAB — CYTOLOGY - PAP

## 2016-11-25 ENCOUNTER — Encounter: Payer: Self-pay | Admitting: Obstetrics and Gynecology

## 2017-01-25 ENCOUNTER — Encounter: Payer: Self-pay | Admitting: Obstetrics and Gynecology

## 2017-05-06 ENCOUNTER — Encounter: Payer: Self-pay | Admitting: Obstetrics and Gynecology

## 2017-07-23 ENCOUNTER — Encounter: Payer: Self-pay | Admitting: Obstetrics and Gynecology

## 2017-08-17 ENCOUNTER — Ambulatory Visit (INDEPENDENT_AMBULATORY_CARE_PROVIDER_SITE_OTHER): Payer: BLUE CROSS/BLUE SHIELD | Admitting: Obstetrics and Gynecology

## 2017-08-17 ENCOUNTER — Encounter: Payer: Self-pay | Admitting: Obstetrics and Gynecology

## 2017-08-17 VITALS — BP 106/70 | HR 81 | Ht 67.0 in | Wt 179.5 lb

## 2017-08-17 DIAGNOSIS — Z01419 Encounter for gynecological examination (general) (routine) without abnormal findings: Secondary | ICD-10-CM

## 2017-08-17 MED ORDER — NORETHIN ACE-ETH ESTRAD-FE 1-20 MG-MCG(24) PO CAPS: 1.0000 | ORAL_CAPSULE | Freq: Every day | ORAL | 4 refills | Status: DC

## 2017-08-17 NOTE — Progress Notes (Signed)
  Subjective:     Evelyn Caldwell is a married white 27 y.o. female and is here for a comprehensive physical exam. G1P1 with VAVD. The patient reports problems - no sex drive.  Social History   Socioeconomic History  . Marital status: Married    Spouse name: Not on file  . Number of children: Not on file  . Years of education: Not on file  . Highest education level: Not on file  Social Needs  . Financial resource strain: Not on file  . Food insecurity - worry: Not on file  . Food insecurity - inability: Not on file  . Transportation needs - medical: Not on file  . Transportation needs - non-medical: Not on file  Occupational History  . Not on file  Tobacco Use  . Smoking status: Never Smoker  . Smokeless tobacco: Never Used  Substance and Sexual Activity  . Alcohol use: No    Comment: occasionally  . Drug use: No  . Sexual activity: Yes    Birth control/protection: None, Pill  Other Topics Concern  . Not on file  Social History Narrative  . Not on file   Health Maintenance  Topic Date Due  . INFLUENZA VACCINE  12/29/2016  . PAP SMEAR  08/19/2019  . TETANUS/TDAP  12/15/2025  . HIV Screening  Completed    The following portions of the patient's history were reviewed and updated as appropriate: allergies, current medications, past family history, past medical history, past social history, past surgical history and problem list.  Review of Systems Pertinent items noted in HPI and remainder of comprehensive ROS otherwise negative.   Objective:    General appearance: alert, cooperative and appears stated age Neck: no adenopathy, no carotid bruit, no JVD, supple, symmetrical, trachea midline and thyroid not enlarged, symmetric, no tenderness/mass/nodules Lungs: clear to auscultation bilaterally Breasts: normal appearance, no masses or tenderness Heart: regular rate and rhythm, S1, S2 normal, no murmur, click, rub or gallop Abdomen: soft, non-tender; bowel sounds  normal; no masses,  no organomegaly Pelvic: cervix normal in appearance, external genitalia normal, no adnexal masses or tenderness, no cervical motion tenderness, rectovaginal septum normal, uterus normal size, shape, and consistency and vagina normal without discharge    Assessment:    Healthy female exam. OCP user     Plan:  meds refilled RTC 1 years Sehaj Mcenroe [place], cnm   See After Visit Summary for Counseling Recommendations

## 2017-08-17 NOTE — Patient Instructions (Signed)
Preventive Care 18-39 Years, Female Preventive care refers to lifestyle choices and visits with your health care provider that can promote health and wellness. What does preventive care include?  A yearly physical exam. This is also called an annual well check.  Dental exams once or twice a year.  Routine eye exams. Ask your health care provider how often you should have your eyes checked.  Personal lifestyle choices, including: ? Daily care of your teeth and gums. ? Regular physical activity. ? Eating a healthy diet. ? Avoiding tobacco and drug use. ? Limiting alcohol use. ? Practicing safe sex. ? Taking vitamin and mineral supplements as recommended by your health care provider. What happens during an annual well check? The services and screenings done by your health care provider during your annual well check will depend on your age, overall health, lifestyle risk factors, and family history of disease. Counseling Your health care provider may ask you questions about your:  Alcohol use.  Tobacco use.  Drug use.  Emotional well-being.  Home and relationship well-being.  Sexual activity.  Eating habits.  Work and work Statistician.  Method of birth control.  Menstrual cycle.  Pregnancy history.  Screening You may have the following tests or measurements:  Height, weight, and BMI.  Diabetes screening. This is done by checking your blood sugar (glucose) after you have not eaten for a while (fasting).  Blood pressure.  Lipid and cholesterol levels. These may be checked every 5 years starting at age 38.  Skin check.  Hepatitis C blood test.  Hepatitis B blood test.  Sexually transmitted disease (STD) testing.  BRCA-related cancer screening. This may be done if you have a family history of breast, ovarian, tubal, or peritoneal cancers.  Pelvic exam and Pap test. This may be done every 3 years starting at age 38. Starting at age 30, this may be done  every 5 years if you have a Pap test in combination with an HPV test.  Discuss your test results, treatment options, and if necessary, the need for more tests with your health care provider. Vaccines Your health care provider may recommend certain vaccines, such as:  Influenza vaccine. This is recommended every year.  Tetanus, diphtheria, and acellular pertussis (Tdap, Td) vaccine. You may need a Td booster every 10 years.  Varicella vaccine. You may need this if you have not been vaccinated.  HPV vaccine. If you are 39 or younger, you may need three doses over 6 months.  Measles, mumps, and rubella (MMR) vaccine. You may need at least one dose of MMR. You may also need a second dose.  Pneumococcal 13-valent conjugate (PCV13) vaccine. You may need this if you have certain conditions and were not previously vaccinated.  Pneumococcal polysaccharide (PPSV23) vaccine. You may need one or two doses if you smoke cigarettes or if you have certain conditions.  Meningococcal vaccine. One dose is recommended if you are age 68-21 years and a first-year college student living in a residence hall, or if you have one of several medical conditions. You may also need additional booster doses.  Hepatitis A vaccine. You may need this if you have certain conditions or if you travel or work in places where you may be exposed to hepatitis A.  Hepatitis B vaccine. You may need this if you have certain conditions or if you travel or work in places where you may be exposed to hepatitis B.  Haemophilus influenzae type b (Hib) vaccine. You may need this  if you have certain risk factors.  Talk to your health care provider about which screenings and vaccines you need and how often you need them. This information is not intended to replace advice given to you by your health care provider. Make sure you discuss any questions you have with your health care provider. Document Released: 07/13/2001 Document Revised:  02/04/2016 Document Reviewed: 03/18/2015 Elsevier Interactive Patient Education  2018 Elsevier Inc.  

## 2017-08-18 ENCOUNTER — Encounter: Payer: Self-pay | Admitting: Obstetrics and Gynecology

## 2017-08-18 ENCOUNTER — Other Ambulatory Visit: Payer: Self-pay | Admitting: Obstetrics and Gynecology

## 2017-08-18 MED ORDER — NORGESTREL-ETHINYL ESTRADIOL 0.3-30 MG-MCG PO TABS: 1.0000 | ORAL_TABLET | Freq: Every day | ORAL | 11 refills | Status: DC

## 2018-02-21 ENCOUNTER — Ambulatory Visit (INDEPENDENT_AMBULATORY_CARE_PROVIDER_SITE_OTHER): Payer: 59 | Admitting: Obstetrics and Gynecology

## 2018-02-21 ENCOUNTER — Encounter: Payer: Self-pay | Admitting: Obstetrics and Gynecology

## 2018-02-21 VITALS — BP 119/83 | HR 91 | Ht 68.0 in | Wt 186.5 lb

## 2018-02-21 DIAGNOSIS — N926 Irregular menstruation, unspecified: Secondary | ICD-10-CM

## 2018-02-21 LAB — POCT URINE PREGNANCY: Preg Test, Ur: POSITIVE — AB

## 2018-02-21 NOTE — Progress Notes (Signed)
  Subjective:     Patient ID: Evelyn Caldwell, female   DOB: 1991-02-15, 27 y.o.   MRN: [id number]  HPI Here for pregnancy confirmation with LMP  01/09/18, giving EDC 10/16/18 and EGA 6w 2 days. Reports breast tenderness and fatigue with Home UPT+ 02/05/18. Stopped OCPs in June due to not having insurance and mood swings. Used condoms until August.  Review of Systems  All other systems reviewed and are negative.      Objective:   Physical Exam A&Ox4 Well roomed female in no distress Blood pressure 119/83, pulse 91, height 5\' 8"  (1.727 m), weight 186 lb 8 oz (84.6 kg), last menstrual period 01/09/2018, currently breastfeeding. UPT+    Assessment:     Missed menses    Plan:     Viability scan and labs/nurse intake in 10 days then New OB at 11 weeks.    Korde Jeppsen,CNM

## 2018-02-22 ENCOUNTER — Telehealth: Payer: Self-pay | Admitting: Obstetrics and Gynecology

## 2018-02-22 NOTE — Telephone Encounter (Signed)
pls advise

## 2018-02-22 NOTE — Telephone Encounter (Signed)
Called pt she voiced understanding

## 2018-02-22 NOTE — Telephone Encounter (Signed)
The patient has hx of kidney stones, and has had pain all night in ribs and now in her lower abdomen; she is worried it may be the pregnancy.  No vomiting, light nausea.  No discharge, no bleeding.  She would like to speak to Amy to see what she needs to do.  Please advise, thanks.

## 2018-02-22 NOTE — Telephone Encounter (Signed)
Have her make an appointment with Deneise Lever on Friday to be checked, and of course go in the ED if symptoms worsen before then.

## 2018-02-23 ENCOUNTER — Emergency Department: Payer: 59

## 2018-02-23 ENCOUNTER — Other Ambulatory Visit: Payer: Self-pay

## 2018-02-23 ENCOUNTER — Emergency Department
Admission: EM | Admit: 2018-02-23 | Discharge: 2018-02-23 | Disposition: A | Discharge status: Home / Self Care | Payer: 59 | Attending: Emergency Medicine | Admitting: Emergency Medicine

## 2018-02-23 DIAGNOSIS — Z3A01 Less than 8 weeks gestation of pregnancy: Secondary | ICD-10-CM | POA: Insufficient documentation

## 2018-02-23 DIAGNOSIS — Z79899 Other long term (current) drug therapy: Secondary | ICD-10-CM | POA: Diagnosis not present

## 2018-02-23 DIAGNOSIS — R109 Unspecified abdominal pain: Secondary | ICD-10-CM | POA: Diagnosis not present

## 2018-02-23 DIAGNOSIS — O9989 Other specified diseases and conditions complicating pregnancy, childbirth and the puerperium: Secondary | ICD-10-CM | POA: Diagnosis present

## 2018-02-23 DIAGNOSIS — N23 Unspecified renal colic: Secondary | ICD-10-CM

## 2018-02-23 DIAGNOSIS — Z3491 Encounter for supervision of normal pregnancy, unspecified, first trimester: Secondary | ICD-10-CM | POA: Diagnosis not present

## 2018-02-23 DIAGNOSIS — R1031 Right lower quadrant pain: Secondary | ICD-10-CM | POA: Insufficient documentation

## 2018-02-23 LAB — CBC
HCT: 34.7 % — ABNORMAL LOW (ref 35.0–47.0)
Hemoglobin: 11.9 g/dL — ABNORMAL LOW (ref 12.0–16.0)
MCH: 28.4 pg (ref 26.0–34.0)
MCHC: 34.3 g/dL (ref 32.0–36.0)
MCV: 82.9 fL (ref 80.0–100.0)
Platelets: 219 10*3/uL (ref 150–440)
RBC: 4.19 MIL/uL (ref 3.80–5.20)
RDW: 15.3 % — ABNORMAL HIGH (ref 11.5–14.5)
WBC: 6.5 10*3/uL (ref 3.6–11.0)

## 2018-02-23 LAB — URINALYSIS, COMPLETE (UACMP) WITH MICROSCOPIC
Bilirubin Urine: NEGATIVE
Glucose, UA: NEGATIVE mg/dL
Ketones, ur: NEGATIVE mg/dL
Nitrite: NEGATIVE
Protein, ur: 30 mg/dL — AB
RBC / HPF: >50 RBC/hpf — ABNORMAL HIGH (ref 0–5)
Specific Gravity, Urine: 1.014 (ref 1.005–1.030)
Squamous Epithelial / LPF: NONE SEEN (ref 0–5)
WBC, UA: >50 WBC/hpf — ABNORMAL HIGH (ref 0–5)
pH: 5 (ref 5.0–8.0)

## 2018-02-23 LAB — COMPREHENSIVE METABOLIC PANEL
ALT: 15 U/L (ref 0–44)
AST: 21 U/L (ref 15–41)
Albumin: 3.4 g/dL — ABNORMAL LOW (ref 3.5–5.0)
Alkaline Phosphatase: 76 U/L (ref 38–126)
Anion gap: 9 (ref 5–15)
BUN: 11 mg/dL (ref 6–20)
CO2: 22 mmol/L (ref 22–32)
Calcium: 8.8 mg/dL — ABNORMAL LOW (ref 8.9–10.3)
Chloride: 109 mmol/L (ref 98–111)
Creatinine, Ser: 0.5 mg/dL (ref 0.44–1.00)
GFR calc Af Amer: >60 mL/min (ref >60)
GFR calc non Af Amer: >60 mL/min (ref >60)
Glucose, Bld: 102 mg/dL — ABNORMAL HIGH (ref 70–99)
Potassium: 3.9 mmol/L (ref 3.5–5.1)
Sodium: 140 mmol/L (ref 135–145)
Total Bilirubin: 0.2 mg/dL — ABNORMAL LOW (ref 0.3–1.2)
Total Protein: 7.3 g/dL (ref 6.5–8.1)

## 2018-02-23 LAB — HCG, QUANTITATIVE, PREGNANCY: hCG, Beta Chain, Quant, S: 20166 m[IU]/mL — ABNORMAL HIGH (ref <5)

## 2018-02-23 MED ORDER — ONDANSETRON 4 MG PO TBDP: 4.0000 mg | ORAL_TABLET | Freq: Three times a day (TID) | ORAL | 0 refills | Status: DC | PRN

## 2018-02-23 MED ORDER — CEPHALEXIN 500 MG PO CAPS: 500.0000 mg | ORAL_CAPSULE | Freq: Four times a day (QID) | ORAL | 0 refills | Status: DC

## 2018-02-23 MED ORDER — OXYCODONE-ACETAMINOPHEN 5-325 MG PO TABS: 1.0000 | ORAL_TABLET | Freq: Three times a day (TID) | ORAL | 0 refills | Status: DC | PRN

## 2018-02-23 NOTE — ED Triage Notes (Addendum)
Pt in with co right sided abd pain that radiates in to right lower back. States has hx of kidney stones but not the same. Pt is [redacted] weeks pregnant.

## 2018-02-23 NOTE — ED Provider Notes (Signed)
[facility] Emergency Department Provider Note   First MD Initiated Contact with Patient 02/23/18 [phone]     (approximate)  I have reviewed the triage vital signs and the nursing notes.   HISTORY  Chief Complaint Abdominal Pain    HPI Evelyn Caldwell is a 27 y.o. female G2, P1 approximately [redacted] weeks pregnant with history of kidney stone presents to the emergency department with 3-day history of right flank pain with acute worsening this morning.  Patient states pain this morning was 10 out of 10 located on the right flank/right lower quadrant.  Patient denies any dysuria no hematuria.  Patient denies any nausea or vomiting.  Patient denies any vaginal bleeding.   Past Medical History:  Diagnosis Date  . Kidney stone   . Vulvar lesion     Patient Active Problem List   Diagnosis Date Noted  . Internal hemorrhoids 08/18/2016  . Status post vacuum-assisted vaginal delivery 03/09/2016  . Hydronephrosis 01/26/2016  . Nephrolithiasis 10/22/2015  . Headache, migraine 01/24/2015    Past Surgical History:  Procedure Laterality Date  . none      Prior to Admission medications   Medication Sig Start Date End Date Taking? Authorizing Provider  Cholecalciferol (VITAMIN D3) 5000 units CAPS Take 1 capsule (5,000 Units total) by mouth daily. Patient not taking: Reported on 08/17/2017 03/11/16   Joylene Igo, CNM  norgestrel-ethinyl estradiol (LO/OVRAL,CRYSELLE) 0.3-30 MG-MCG tablet Take 1 tablet by mouth daily. Patient not taking: Reported on 02/21/2018 08/18/17   Joylene Igo, CNM  Prenatal Vit-Fe Fumarate-FA (PRENATAL MULTIVITAMIN) TABS tablet Take 1 tablet by mouth daily at 12 noon.    [provider]    Allergies No known drug allergies  Family History  Problem Relation Age of Onset  . Congestive Heart Failure Maternal Grandfather   . Parkinson's disease Paternal Grandmother   . Kidney failure Cousin   . Kidney cancer Neg Hx    . Prostate cancer Neg Hx     Social History Social History   Tobacco Use  . Smoking status: Never Smoker  . Smokeless tobacco: Never Used  Substance Use Topics  . Alcohol use: No    Comment: occasionally  . Drug use: No    Review of Systems Constitutional: No fever/chills Eyes: No visual changes. ENT: No sore throat. Cardiovascular: Denies chest pain. Respiratory: Denies shortness of breath. Gastrointestinal: No abdominal pain.  No nausea, no vomiting.  No diarrhea.  No constipation. Genitourinary: Negative for dysuria. Musculoskeletal: Negative for neck pain.  Negative for back pain. Integumentary: Negative for rash. Neurological: Negative for headaches, focal weakness or numbness.   ____________________________________________   PHYSICAL EXAM:  VITAL SIGNS: ED Triage Vitals [02/23/18 0612]  Enc Vitals Group     BP 105/66     Pulse Rate 85     Resp 20     Temp 98.1 F (36.7 C)     Temp Source Oral     SpO2 99 %     Weight 83.9 kg (185 lb)     Height      Head Circumference      Peak Flow      Pain Score 7     Pain Loc      Pain Edu?      Excl. in [place]?    Constitutional: Alert and oriented. Well appearing and in no acute distress. Eyes: Conjunctivae are normal.  PERRL. EOMI. Head: Atraumatic. Mouth/Throat: Mucous membranes are moist. Neck: No stridor.  Cardiovascular: Normal rate, regular rhythm. Good peripheral circulation. Grossly normal heart sounds. Respiratory: Normal respiratory effort.  No retractions. Lungs CTAB. Gastrointestinal: Soft and nontender. No distention.  Musculoskeletal: No lower extremity tenderness nor edema. No gross deformities of extremities. Neurologic:  Normal speech and language. No gross focal neurologic deficits are appreciated.  Skin:  Skin is warm, dry and intact. No rash noted. Psychiatric: Mood and affect are normal. Speech and behavior are normal.  ____________________________________________   LABS (all labs  ordered are listed, but only abnormal results are displayed)  Labs Reviewed  CBC  COMPREHENSIVE METABOLIC PANEL  URINALYSIS, COMPLETE (UACMP) WITH MICROSCOPIC  HCG, QUANTITATIVE, PREGNANCY      Procedures   ____________________________________________   INITIAL IMPRESSION / ASSESSMENT AND PLAN / ED COURSE  As part of my medical decision making, I reviewed the following data within the electronic MEDICAL RECORD NUMBER   27 year old female [redacted] weeks pregnant with above-stated history and physical exam secondary to flank pain concern for possible pyelonephritis versus kidney stone.  Also concern for possible pregnancy related pathology and as such ultrasound of the kidneys and pregnancy ultrasound pending at this time.  Patient's laboratory data also pending.  Patient's care transferred to Dr. Jimmye Norman _______________________________________  FINAL CLINICAL IMPRESSION(S) / ED DIAGNOSES  Right flank and abdominal pain   MEDICATIONS GIVEN DURING THIS VISIT:  Medications - No data to display   ED Discharge Orders    None       Note:  This document was prepared using Dragon voice recognition software and may include unintentional dictation errors.    Gregor Hams, MD 02/24/18 [phone]

## 2018-02-23 NOTE — ED Notes (Signed)
Pt alert and oriented X4, active, cooperative, pt in NAD. RR even and unlabored, color WNL.  Pt informed to return if any life threatening symptoms occur.  Discharge and followup instructions reviewed.  

## 2018-02-23 NOTE — ED Provider Notes (Signed)
Patient is doing well, ultrasounds are as dictated below.  IMPRESSION: Single viable IUP with estimated gestational age of [redacted] weeks 6 days with estimated date of confinement of Oct 20, 2018. No subchorionic hemorrhage is observed.  Simple appearing cyst in the left ovary measuring 2.5 x 2.1 x 2.7 cm. It demonstrates no abnormal vascularity. Normal appearing right ovary.  Trace of free pelvic fluid.  IMPRESSION: Stable moderate right hydronephrosis is noted. Probable debris or sludge is noted in upper pole collecting system, with possible nonobstructive calculus in lower pole collecting system.  She is in no acute distress at this time.  We will treat for kidney stone as well as possible infection.  She is not febrile with no white count and is not in significant pain.  We will send a urine culture and she will be referred for close outpatient follow-up with urology.    Evelyn Newport, MD 02/23/18 1026

## 2018-02-25 LAB — URINE CULTURE
Culture: 10000 — AB
Special Requests: NORMAL

## 2018-02-26 NOTE — Progress Notes (Signed)
ED culture report from visit on 9/26 showing UCx 10 K enterococcus faecalis. Patient was discharged with Keflex for urinary symptoms but cephalosporins will not cover enterococcus. Spoke with Dr. Reita Cliche who wants to treat due to pregnancy and symptoms. She gave verbal for amoxicillin 875 mg po BID x 5 days. I spoke with Mrs. Pagnotta who acknowledges culture results, stop Keflex, start amoxicillin today. All questions answered. She uses [facility] [address] in [place]. I called the pharmacy and spoke with Hoy Register who will fill the prescription now. Confirmed drug, dose, direction, duration via read back.   Maleke Feria A. [place], [place].D., BCPS Clinical Pharmacist 02/23/2018 10:35

## 2018-03-03 ENCOUNTER — Ambulatory Visit (INDEPENDENT_AMBULATORY_CARE_PROVIDER_SITE_OTHER): Payer: 59

## 2018-03-03 ENCOUNTER — Ambulatory Visit: Payer: 59 | Admitting: Obstetrics and Gynecology

## 2018-03-03 VITALS — BP 98/61 | HR 76 | Ht 67.0 in | Wt 187.7 lb

## 2018-03-03 DIAGNOSIS — N926 Irregular menstruation, unspecified: Secondary | ICD-10-CM

## 2018-03-03 DIAGNOSIS — O3413 Maternal care for benign tumor of corpus uteri, third trimester: Secondary | ICD-10-CM

## 2018-03-03 DIAGNOSIS — Z3A01 Less than 8 weeks gestation of pregnancy: Secondary | ICD-10-CM | POA: Diagnosis not present

## 2018-03-03 DIAGNOSIS — N8312 Corpus luteum cyst of left ovary: Secondary | ICD-10-CM

## 2018-03-03 DIAGNOSIS — Z3491 Encounter for supervision of normal pregnancy, unspecified, first trimester: Secondary | ICD-10-CM

## 2018-03-03 NOTE — Progress Notes (Signed)
      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 Pregnancy confirmation done at [facility] ,02/21/18, with who M. Shambley.  G.2  P1001.  LMP 01/09/18.  EDD 10/19/17.  [place]. Pregnancy education material explained and given.   0 cats in the home.  NOB labs ordered. BMI is not greater than 30. TSH/HbgA1c not ordered. HIV and drug screen explained and ordered. Genetic screening discussed. Genetic testing; Unsure. Pt to discuss genetic testing with provider. PNV encouraged. Pt to follow up with provider in 4 .weeks for NOB physical. FMLA and [facility] [employer] signed and discussed. Pt stated she understood.  BP 98/61   Pulse 76   Ht 5\' 7"  (1.702 m)   Wt 187 lb 11.2 oz (85.1 kg)   LMP 01/09/2018   BMI 29.40 kg/m

## 2018-03-04 LAB — DRUG PROFILE, UR, 9 DRUGS (LABCORP)
Amphetamines, Urine: NEGATIVE ng/mL
Barbiturate Quant, Ur: NEGATIVE ng/mL
Benzodiazepine Quant, Ur: NEGATIVE ng/mL
Cannabinoid Quant, Ur: NEGATIVE ng/mL
Cocaine (Metab.): NEGATIVE ng/mL
Methadone Screen, Urine: NEGATIVE ng/mL
Opiate Quant, Ur: NEGATIVE ng/mL
PCP Quant, Ur: NEGATIVE ng/mL
Propoxyphene: NEGATIVE ng/mL

## 2018-03-04 LAB — URINALYSIS, ROUTINE W REFLEX MICROSCOPIC
Bilirubin, UA: NEGATIVE
Glucose, UA: NEGATIVE
Ketones, UA: NEGATIVE
Nitrite, UA: NEGATIVE
Protein, UA: NEGATIVE
RBC, UA: NEGATIVE
Specific Gravity, UA: 1.02 (ref 1.005–1.030)
Urobilinogen, Ur: 0.2 mg/dL (ref 0.2–1.0)
pH, UA: 6.5 (ref 5.0–7.5)

## 2018-03-04 LAB — CBC WITH DIFFERENTIAL/PLATELET
Basophils Absolute: 0 10*3/uL (ref 0.0–0.2)
Basos: 0 %
EOS (ABSOLUTE): 0.1 10*3/uL (ref 0.0–0.4)
Eos: 2 %
Hematocrit: 34.5 % (ref 34.0–46.6)
Hemoglobin: 11.6 g/dL (ref 11.1–15.9)
Immature Grans (Abs): 0 10*3/uL (ref 0.0–0.1)
Immature Granulocytes: 0 %
Lymphocytes Absolute: 1.7 10*3/uL (ref 0.7–3.1)
Lymphs: 25 %
MCH: 28.3 pg (ref 26.6–33.0)
MCHC: 33.6 g/dL (ref 31.5–35.7)
MCV: 84 fL (ref 79–97)
Monocytes Absolute: 0.3 10*3/uL (ref 0.1–0.9)
Monocytes: 5 %
Neutrophils Absolute: 4.6 10*3/uL (ref 1.4–7.0)
Neutrophils: 68 %
Platelets: 257 10*3/uL (ref 150–450)
RBC: 4.1 x10E6/uL (ref 3.77–5.28)
RDW: 14.3 % (ref 12.3–15.4)
WBC: 6.7 10*3/uL (ref 3.4–10.8)

## 2018-03-04 LAB — MICROSCOPIC EXAMINATION
Casts: NONE SEEN /lpf
Epithelial Cells (non renal): >10 /hpf — AB (ref 0–10)

## 2018-03-04 LAB — VARICELLA ZOSTER ANTIBODY, IGG: Varicella zoster IgG: 1334 index (ref >165)

## 2018-03-04 LAB — HIV ANTIBODY (ROUTINE TESTING W REFLEX): HIV Screen 4th Generation wRfx: NONREACTIVE

## 2018-03-04 LAB — NICOTINE SCREEN, URINE: Cotinine Ql Scrn, Ur: NEGATIVE ng/mL

## 2018-03-04 LAB — ABO AND RH: Rh Factor: POSITIVE

## 2018-03-04 LAB — HGB SOLU + RFLX FRAC: Sickle Solubility Test - HGBRFX: NEGATIVE

## 2018-03-04 LAB — RPR: RPR Ser Ql: NONREACTIVE

## 2018-03-04 LAB — RUBELLA SCREEN: Rubella Antibodies, IGG: 3 index (ref >0.99)

## 2018-03-04 LAB — HEPATITIS B SURFACE ANTIGEN: Hepatitis B Surface Ag: NEGATIVE

## 2018-03-05 LAB — CULTURE, OB URINE

## 2018-03-05 LAB — URINE CULTURE, OB REFLEX: Organism ID, Bacteria: NO GROWTH

## 2018-03-07 LAB — GC/CHLAMYDIA PROBE AMP
Chlamydia trachomatis, NAA: NEGATIVE
Neisseria gonorrhoeae by PCR: NEGATIVE

## 2018-03-28 ENCOUNTER — Encounter: Payer: Self-pay | Admitting: Obstetrics and Gynecology

## 2018-03-28 ENCOUNTER — Ambulatory Visit: Payer: Self-pay | Admitting: Obstetrics and Gynecology

## 2018-03-28 VITALS — BP 103/68 | HR 91 | Wt 189.6 lb

## 2018-03-28 DIAGNOSIS — Z3491 Encounter for supervision of normal pregnancy, unspecified, first trimester: Secondary | ICD-10-CM

## 2018-03-28 DIAGNOSIS — Z3492 Encounter for supervision of normal pregnancy, unspecified, second trimester: Secondary | ICD-10-CM

## 2018-03-28 LAB — POCT URINALYSIS DIPSTICK OB
Bilirubin, UA: NEGATIVE
Glucose, UA: NEGATIVE
Ketones, UA: NEGATIVE
Nitrite, UA: NEGATIVE
Spec Grav, UA: 1.015 (ref 1.010–1.025)
Urobilinogen, UA: 0.2 E.U./dL
pH, UA: 6 (ref 5.0–8.0)

## 2018-03-28 NOTE — Progress Notes (Signed)
NOB PE- pt is doing well, wants to have genetic testing

## 2018-03-28 NOTE — Patient Instructions (Signed)
Second Trimester of Pregnancy The second trimester is from week 13 through week 28, month 4 through 6. This is often the time in pregnancy that you feel your best. Often times, morning sickness has lessened or quit. You may have more energy, and you may get hungry more often. Your unborn baby (fetus) is growing rapidly. At the end of the sixth month, he or she is about 9 inches long and weighs about 1 pounds. You will likely feel the baby move (quickening) between 18 and 20 weeks of pregnancy. Follow these instructions at home:  Avoid all smoking, herbs, and alcohol. Avoid drugs not approved by your doctor.  Do not use any tobacco products, including cigarettes, chewing tobacco, and electronic cigarettes. If you need help quitting, ask your doctor. You may get counseling or other support to help you quit.  Only take medicine as told by your doctor. Some medicines are safe and some are not during pregnancy.  Exercise only as told by your doctor. Stop exercising if you start having cramps.  Eat regular, healthy meals.  Wear a good support bra if your breasts are tender.  Do not use hot tubs, steam rooms, or saunas.  Wear your seat belt when driving.  Avoid raw meat, uncooked cheese, and liter boxes and soil used by cats.  Take your prenatal vitamins.  Take 1500-2000 milligrams of calcium daily starting at the 20th week of pregnancy until you deliver your baby.  Try taking medicine that helps you poop (stool softener) as needed, and if your doctor approves. Eat more fiber by eating fresh fruit, vegetables, and whole grains. Drink enough fluids to keep your pee (urine) clear or pale yellow.  Take warm water baths (sitz baths) to soothe pain or discomfort caused by hemorrhoids. Use hemorrhoid cream if your doctor approves.  If you have puffy, bulging veins (varicose veins), wear support hose. Raise (elevate) your feet for 15 minutes, 3-4 times a day. Limit salt in your diet.  Avoid heavy  lifting, wear low heals, and sit up straight.  Rest with your legs raised if you have leg cramps or low back pain.  Visit your dentist if you have not gone during your pregnancy. Use a soft toothbrush to brush your teeth. Be gentle when you floss.  You can have sex (intercourse) unless your doctor tells you not to.  Go to your doctor visits. Get help if:  You feel dizzy.  You have mild cramps or pressure in your lower belly (abdomen).  You have a nagging pain in your belly area.  You continue to feel sick to your stomach (nauseous), throw up (vomit), or have watery poop (diarrhea).  You have bad smelling fluid coming from your vagina.  You have pain with peeing (urination). Get help right away if:  You have a fever.  You are leaking fluid from your vagina.  You have spotting or bleeding from your vagina.  You have severe belly cramping or pain.  You lose or gain weight rapidly.  You have trouble catching your breath and have chest pain.  You notice sudden or extreme puffiness (swelling) of your face, hands, ankles, feet, or legs.  You have not felt the baby move in over an hour.  You have severe headaches that do not go away with medicine.  You have vision changes. This information is not intended to replace advice given to you by your health care provider. Make sure you discuss any questions you have with your health care   provider. Document Released: 08/11/2009 Document Revised: 10/23/2015 Document Reviewed: 07/18/2012 Elsevier Interactive Patient Education  2017 Elsevier Inc.  

## 2018-03-28 NOTE — Addendum Note (Signed)
Addended by: Lorinda Creed on: 03/28/2018 09:59 AM   Modules accepted: Orders

## 2018-03-28 NOTE — Progress Notes (Signed)
NEW OB HISTORY AND PHYSICAL  SUBJECTIVE:       Evelyn Caldwell is a 27 y.o. G2P1001 female, Patient's last menstrual period was 01/09/2018., Estimated Date of Delivery: 10/20/18, [redacted]w[redacted]d, presents today for establishment of Prenatal Care. She has no unusual complaints and complains of lower cramping and loss stools      Gynecologic History Patient's last menstrual period was 01/09/2018. Normal Contraception: none Last Pap: 2018. Results were: normal  Obstetric History OB History  Gravida Para Term Preterm AB Living  2 1 1     1   SAB TAB Ectopic Multiple Live Births          1    # Outcome Date GA Lbr Len/2nd Weight Sex Delivery Anes PTL Lv  2 Current           1 Term 03/09/16 [redacted]w[redacted]d  8 lb 1.6 oz (3.674 kg) F Vag-Spont   LIV    Past Medical History:  Diagnosis Date  . Kidney stone   . Vulvar lesion     Past Surgical History:  Procedure Laterality Date  . none      Current Outpatient Medications on File Prior to Visit  Medication Sig Dispense Refill  . Prenatal Vit-Fe Fumarate-FA (PRENATAL MULTIVITAMIN) TABS tablet Take 1 tablet by mouth daily at 12 noon.    . ondansetron (ZOFRAN ODT) 4 MG disintegrating tablet Take 1 tablet (4 mg total) by mouth every 8 (eight) hours as needed for nausea or vomiting. (Patient not taking: Reported on 03/28/2018) 20 tablet 0   No current facility-administered medications on file prior to visit.     No Known Allergies  Social History   Socioeconomic History  . Marital status: Married    Spouse name: Not on file  . Number of children: Not on file  . Years of education: Not on file  . Highest education level: Not on file  Occupational History  . Not on file  Social Needs  . Financial resource strain: Not on file  . Food insecurity:    Worry: Not on file    Inability: Not on file  . Transportation needs:    Medical: Not on file    Non-medical: Not on file  Tobacco Use  . Smoking status: Never Smoker  . Smokeless  tobacco: Never Used  Substance and Sexual Activity  . Alcohol use: No    Comment: occasionally  . Drug use: No  . Sexual activity: Yes    Birth control/protection: None  Lifestyle  . Physical activity:    Days per week: Not on file    Minutes per session: Not on file  . Stress: Not on file  Relationships  . Social connections:    Talks on phone: Not on file    Gets together: Not on file    Attends religious service: Not on file    Active member of club or organization: Not on file    Attends meetings of clubs or organizations: Not on file    Relationship status: Not on file  . Intimate partner violence:    Fear of current or ex partner: Not on file    Emotionally abused: Not on file    Physically abused: Not on file    Forced sexual activity: Not on file  Other Topics Concern  . Not on file  Social History Narrative  . Not on file    Family History  Problem Relation Age of Onset  . Congestive Heart Failure Maternal Grandfather   .  Parkinson's disease Paternal Grandmother   . Kidney failure Cousin   . Kidney cancer Neg Hx   . Prostate cancer Neg Hx     The following portions of the patient's history were reviewed and updated as appropriate: allergies, current medications, past OB history, past medical history, past surgical history, past family history, past social history, and problem list.    OBJECTIVE: Initial Physical Exam (New OB)  GENERAL APPEARANCE: alert, well appearing, in no apparent distress, oriented to person, place and time, overweight HEAD: normocephalic, atraumatic MOUTH: mucous membranes moist, pharynx normal without lesions and dental hygiene good THYROID: no thyromegaly or masses present BREASTS: not examined LUNGS: clear to auscultation, no wheezes, rales or rhonchi, symmetric air entry HEART: regular rate and rhythm, no murmurs ABDOMEN: soft, nontender, nondistended, no abnormal masses, no epigastric pain, fundus not palpable and FHT present  confirmed by u/s EXTREMITIES: no redness or tenderness in the calves or thighs SKIN: normal coloration and turgor, no rashes LYMPH NODES: no adenopathy palpable NEUROLOGIC: alert, oriented, normal speech, no focal findings or movement disorder noted  PELVIC EXAM not indicated  ASSESSMENT: Normal pregnancy  PLAN: Prenatal care CBC and genetic screening obtained today. See orders

## 2018-03-29 LAB — ANTIBODY SCREEN: Antibody Screen: NEGATIVE

## 2018-04-07 ENCOUNTER — Other Ambulatory Visit: Payer: Self-pay | Admitting: Obstetrics and Gynecology

## 2018-04-07 MED ORDER — BUTALBITAL-APAP-CAFFEINE 50-325-40 MG PO CAPS: 1.0000 | ORAL_CAPSULE | Freq: Four times a day (QID) | ORAL | 3 refills | Status: DC | PRN

## 2018-04-25 ENCOUNTER — Ambulatory Visit (INDEPENDENT_AMBULATORY_CARE_PROVIDER_SITE_OTHER): Payer: 59 | Admitting: Certified Nurse Midwife

## 2018-04-25 ENCOUNTER — Encounter: Payer: 59 | Admitting: Certified Nurse Midwife

## 2018-04-25 VITALS — BP 102/79 | HR 104 | Wt 187.9 lb

## 2018-04-25 DIAGNOSIS — O09292 Supervision of pregnancy with other poor reproductive or obstetric history, second trimester: Secondary | ICD-10-CM

## 2018-04-25 DIAGNOSIS — Z3492 Encounter for supervision of normal pregnancy, unspecified, second trimester: Secondary | ICD-10-CM

## 2018-04-25 DIAGNOSIS — Z8759 Personal history of other complications of pregnancy, childbirth and the puerperium: Secondary | ICD-10-CM

## 2018-04-25 LAB — POCT URINALYSIS DIPSTICK OB
Bilirubin, UA: NEGATIVE
Glucose, UA: NEGATIVE
Ketones, UA: NEGATIVE
Nitrite, UA: NEGATIVE
Spec Grav, UA: 1.02 (ref 1.010–1.025)
Urobilinogen, UA: 0.2 E.U./dL
pH, UA: 6 (ref 5.0–8.0)

## 2018-04-25 MED ORDER — BUTALBITAL-APAP-CAFFEINE 50-325-40 MG PO CAPS: 1.0000 | ORAL_CAPSULE | Freq: Four times a day (QID) | ORAL | 0 refills | Status: DC | PRN

## 2018-04-25 MED ORDER — MAGNESIUM OXIDE 400 (241.3 MG) MG PO TABS: 400.0000 mg | ORAL_TABLET | Freq: Two times a day (BID) | ORAL | 2 refills | Status: DC | PRN

## 2018-04-25 NOTE — Progress Notes (Signed)
ROB-Doing well except for headaches. Unable to afford Fioricet at [facility]. Rx sent to [facility], will cost ~$11. Discussed home treatment measures including magnesium supplementation. Anticipatory guidance regarding course of prenatal care. Reviewed red flag symptoms and when to call. RTC x 5 weeks for anatomy scan and ROB or sooner if needed.

## 2018-04-25 NOTE — Progress Notes (Signed)
ROB.  Patient wondering if she can have different prescription for headache, c/o daily headaches.  Insurance not covering Butalbital, taking Tylenol with occasional relief.

## 2018-04-25 NOTE — Patient Instructions (Signed)

## 2018-05-11 ENCOUNTER — Other Ambulatory Visit: Payer: Self-pay | Admitting: Obstetrics and Gynecology

## 2018-05-11 ENCOUNTER — Ambulatory Visit (INDEPENDENT_AMBULATORY_CARE_PROVIDER_SITE_OTHER): Payer: 59 | Admitting: Obstetrics and Gynecology

## 2018-05-11 ENCOUNTER — Other Ambulatory Visit: Payer: Self-pay

## 2018-05-11 ENCOUNTER — Other Ambulatory Visit: Payer: 59

## 2018-05-11 VITALS — BP 112/71 | HR 100 | Wt 187.0 lb

## 2018-05-11 DIAGNOSIS — Z3492 Encounter for supervision of normal pregnancy, unspecified, second trimester: Secondary | ICD-10-CM

## 2018-05-11 DIAGNOSIS — O26852 Spotting complicating pregnancy, second trimester: Secondary | ICD-10-CM

## 2018-05-11 DIAGNOSIS — O209 Hemorrhage in early pregnancy, unspecified: Secondary | ICD-10-CM

## 2018-05-11 LAB — POCT URINALYSIS DIPSTICK OB
Bilirubin, UA: NEGATIVE
Glucose, UA: NEGATIVE
Ketones, UA: NEGATIVE
Nitrite, UA: NEGATIVE
Spec Grav, UA: 1.015 (ref 1.010–1.025)
Urobilinogen, UA: 0.2 E.U./dL
pH, UA: 7 (ref 5.0–8.0)

## 2018-05-11 MED ORDER — NITROFURANTOIN MONOHYD MACRO 100 MG PO CAPS: 100.0000 mg | ORAL_CAPSULE | Freq: Two times a day (BID) | ORAL | 1 refills | Status: DC

## 2018-05-11 NOTE — Progress Notes (Signed)
OB WORK IN- pt has seen some light spotting x several days

## 2018-05-11 NOTE — Progress Notes (Signed)
Work in [employer]- reports light pink spotting noted 5 days ago and bright red spotting 3 days ago with some light pink color noted this am. Does report having sex 2 days ago with no concerns. Denies any pain associated with any of the spotting. Urinalysis    Component Value Date/Time   COLORURINE YELLOW (A) 02/23/2018 0624   APPEARANCEUR Clear 03/03/2018 1108   LABSPEC 1.014 02/23/2018 0624   LABSPEC 1.023 06/03/2014 0024   PHURINE 5.0 02/23/2018 0624   GLUCOSEU Negative 05/11/2018 1109   GLUCOSEU Negative 06/03/2014 0024   HGBUR MODERATE (A) 02/23/2018 0624   BILIRUBINUR neg 05/11/2018 1109   BILIRUBINUR Negative 03/03/2018 1108   BILIRUBINUR Negative 06/03/2014 0024   KETONESUR NEGATIVE 02/23/2018 0624   PROTEINUR Negative 03/03/2018 1108   PROTEINUR 30 (A) 02/23/2018 0624   UROBILINOGEN 0.2 05/11/2018 1109   NITRITE neg 05/11/2018 1109   NITRITE Negative 03/03/2018 1108   NITRITE NEGATIVE 02/23/2018 0624   LEUKOCYTESUR Large (3+) (A) 05/11/2018 1109   LEUKOCYTESUR Trace (A) 03/03/2018 1108   LEUKOCYTESUR Negative 06/03/2014 0024   Microscopic wet-mount exam shows negative for pathogens, normal epithelial cells. Will abstain from sex for the next week and started on antibiotics for UTI while awaiting culture results.

## 2018-05-13 LAB — URINE CULTURE

## 2018-05-30 ENCOUNTER — Ambulatory Visit (INDEPENDENT_AMBULATORY_CARE_PROVIDER_SITE_OTHER): Payer: 59 | Admitting: Certified Nurse Midwife

## 2018-05-30 ENCOUNTER — Encounter: Payer: Self-pay | Admitting: Certified Nurse Midwife

## 2018-05-30 ENCOUNTER — Ambulatory Visit (INDEPENDENT_AMBULATORY_CARE_PROVIDER_SITE_OTHER): Payer: 59

## 2018-05-30 ENCOUNTER — Other Ambulatory Visit: Payer: Self-pay | Admitting: Obstetrics and Gynecology

## 2018-05-30 VITALS — BP 118/67 | HR 87 | Wt 187.1 lb

## 2018-05-30 DIAGNOSIS — Z3492 Encounter for supervision of normal pregnancy, unspecified, second trimester: Secondary | ICD-10-CM

## 2018-05-30 DIAGNOSIS — Z363 Encounter for antenatal screening for malformations: Secondary | ICD-10-CM

## 2018-05-30 LAB — POCT URINALYSIS DIPSTICK OB
Bilirubin, UA: NEGATIVE
Glucose, UA: NEGATIVE
Ketones, UA: NEGATIVE
Leukocytes, UA: NEGATIVE
Nitrite, UA: NEGATIVE
Spec Grav, UA: 1.02 (ref 1.010–1.025)
Urobilinogen, UA: 0.2 E.U./dL
pH, UA: 5 (ref 5.0–8.0)

## 2018-05-30 NOTE — Patient Instructions (Signed)

## 2018-05-30 NOTE — Progress Notes (Signed)
ROB doing well , feels good movement. Anatomy u/s today WNL ( see below). Follow up 4 wks.   Philip Aspen, CNM    Patient Name: Evelyn Caldwell [facility] DOB: [birth year] MRN: [id number]  ULTRASOUND REPORT  Location: [facility] Date of Service: 05/30/2018   Indications:Anatomy Ultrasound Findings:  Evelyn Caldwell intrauterine pregnancy is visualized with FHR at 145 BPM. Biometrics give an (U/S) Gestational age of [redacted]w[redacted]d and an (U/S) EDD of 10/18/18; this correlates with the clinically established Estimated Date of Delivery: 10/20/18  Fetal presentation is Breech.  EFW: wnl. Placenta: posterior. Grade: 0 AFI: subjectively normal.  Anatomic survey is complete and normal; Gender - female.    Right Ovary is normal in appearance. Left Ovary is normal appearance. Survey of the adnexa demonstrates no adnexal masses. There is no free peritoneal fluid in the cul de sac.  Impression: 1. [redacted]w[redacted]d Viable Singleton Intrauterine pregnancy by U/S. 2. (U/S) EDD is consistent with Clinically established Estimated Date of Delivery: 10/20/18 . 3. Normal Anatomy Scan  Recommendations: 1.Clinical correlation with the patient's History and Physical Exam.   Abeer Alsammarraie,RDMS

## 2018-05-31 NOTE — L&D Delivery Note (Signed)
Delivery Note At  1019am a viable and healthy female"""Evelyn Caldwell""" was delivered via  (Presentation:OP ;  ).  APGAR:8 ,9 ; weight pending  .   Placenta status delivered intact with 3 vessel  Cord:  with the following complications: NCx1, easily reduced on perineum  Anesthesia:  epidural Episiotomy:  none Lacerations:  vaginal Suture Repair: 3.0 vicryl rapide Est. Blood Loss (mL):  500  Mom to postpartum.  Baby to Couplet care / Skin to Skin.  Aracelys Glade N Mally Gavina 10/18/2018, 10:40 AM

## 2018-06-26 ENCOUNTER — Encounter: Payer: Self-pay | Admitting: *Deleted

## 2018-06-26 ENCOUNTER — Other Ambulatory Visit: Payer: Self-pay | Admitting: *Deleted

## 2018-06-26 ENCOUNTER — Other Ambulatory Visit: Payer: 59

## 2018-06-26 DIAGNOSIS — N390 Urinary tract infection, site not specified: Secondary | ICD-10-CM

## 2018-06-26 MED ORDER — CIPROFLOXACIN HCL 500 MG PO TABS: 500.0000 mg | ORAL_TABLET | Freq: Two times a day (BID) | ORAL | 0 refills | Status: DC

## 2018-06-27 ENCOUNTER — Ambulatory Visit: Payer: 59 | Admitting: Obstetrics and Gynecology

## 2018-06-27 VITALS — BP 106/70 | HR 110 | Wt 184.3 lb

## 2018-06-27 DIAGNOSIS — Z3492 Encounter for supervision of normal pregnancy, unspecified, second trimester: Secondary | ICD-10-CM

## 2018-06-27 NOTE — Progress Notes (Signed)
ROB- c/o no appetite,she is having some low back pain, kidney area, she dropped off ua 06/26/18 sent in cipro for pt

## 2018-06-27 NOTE — Progress Notes (Signed)
ROB- discussed urine findings, will await culture. Doing well otherwise.

## 2018-06-28 LAB — URINE CULTURE

## 2018-07-10 ENCOUNTER — Other Ambulatory Visit (INDEPENDENT_AMBULATORY_CARE_PROVIDER_SITE_OTHER): Payer: 59

## 2018-07-10 ENCOUNTER — Encounter: Payer: Self-pay | Admitting: *Deleted

## 2018-07-10 ENCOUNTER — Other Ambulatory Visit: Payer: Self-pay | Admitting: *Deleted

## 2018-07-10 DIAGNOSIS — O2342 Unspecified infection of urinary tract in pregnancy, second trimester: Secondary | ICD-10-CM | POA: Diagnosis not present

## 2018-07-10 LAB — POCT URINALYSIS DIPSTICK OB
Glucose, UA: NEGATIVE
Ketones, UA: 5
Nitrite, UA: NEGATIVE
Spec Grav, UA: 1.015 (ref 1.010–1.025)
Urobilinogen, UA: 0.2 E.U./dL
pH, UA: 6 (ref 5.0–8.0)

## 2018-07-10 MED ORDER — PHENAZOPYRIDINE HCL 200 MG PO TABS: 200.0000 mg | ORAL_TABLET | Freq: Three times a day (TID) | ORAL | 2 refills | Status: DC | PRN

## 2018-07-12 LAB — URINE CULTURE

## 2018-07-13 ENCOUNTER — Other Ambulatory Visit: Payer: Self-pay | Admitting: Obstetrics and Gynecology

## 2018-07-13 MED ORDER — CIPROFLOXACIN HCL 500 MG PO TABS: 500.0000 mg | ORAL_TABLET | Freq: Two times a day (BID) | ORAL | 0 refills | Status: DC

## 2018-07-18 ENCOUNTER — Ambulatory Visit (INDEPENDENT_AMBULATORY_CARE_PROVIDER_SITE_OTHER): Payer: 59 | Admitting: Certified Nurse Midwife

## 2018-07-18 ENCOUNTER — Other Ambulatory Visit (HOSPITAL_COMMUNITY)
Admission: RE | Admit: 2018-07-18 | Discharge: 2018-07-18 | Disposition: A | Discharge status: Home / Self Care | Payer: 59 | Source: Ambulatory Visit | Attending: Certified Nurse Midwife | Admitting: Certified Nurse Midwife

## 2018-07-18 VITALS — BP 115/74 | HR 94 | Wt 190.4 lb

## 2018-07-18 DIAGNOSIS — Z3492 Encounter for supervision of normal pregnancy, unspecified, second trimester: Secondary | ICD-10-CM | POA: Insufficient documentation

## 2018-07-18 DIAGNOSIS — Z87440 Personal history of urinary (tract) infections: Secondary | ICD-10-CM

## 2018-07-18 DIAGNOSIS — N898 Other specified noninflammatory disorders of vagina: Secondary | ICD-10-CM | POA: Diagnosis not present

## 2018-07-18 DIAGNOSIS — N949 Unspecified condition associated with female genital organs and menstrual cycle: Secondary | ICD-10-CM | POA: Diagnosis not present

## 2018-07-18 DIAGNOSIS — R399 Unspecified symptoms and signs involving the genitourinary system: Secondary | ICD-10-CM

## 2018-07-18 LAB — POCT URINALYSIS DIPSTICK OB
Bilirubin, UA: NEGATIVE
Glucose, UA: NEGATIVE
Ketones, UA: NEGATIVE
Leukocytes, UA: NEGATIVE
Nitrite, UA: NEGATIVE
Spec Grav, UA: 1.025 (ref 1.010–1.025)
Urobilinogen, UA: 0.2 E.U./dL
pH, UA: 6.5 (ref 5.0–8.0)

## 2018-07-18 MED ORDER — TERCONAZOLE 0.4 % VA CREA: 1.0000 | TOPICAL_CREAM | Freq: Every day | VAGINAL | 0 refills | Status: DC

## 2018-07-18 NOTE — Addendum Note (Signed)
Addended by: Cherre Huger on: 07/18/2018 05:34 PM   Modules accepted: Orders

## 2018-07-18 NOTE — Progress Notes (Signed)
OB-Pt present today due to still having urinary issues, pain with urination and possible yeast infection. Pt stated that sh is having some itching and burning in the vaginal area.

## 2018-07-18 NOTE — Progress Notes (Signed)
Subjective:   Evelyn Caldwell is a 28 y.o. G2P1001 [redacted]w[redacted]d being seen today for her obstetrical problem visit.  Patient reports vaginal irritation, vaginal burning, urinary urgency and frequency. No contractions, vaginal bleeding or leaking of fluid.  Reports good fetal movement.  Reports recent treatment UTI treatment with Cipro and course was completed. Notes application of A and D ointment to treat external itching and burning.   Denies difficulty breathing or respiratory distress, chest pain, abdominal pain, and leg pain or swelling.   The following portions of the patient's history were reviewed and updated as appropriate: allergies, current medications, past family history, past medical history, past social history, past surgical history and problem list.   Review of Systems:  ROS negative except as noted above. Information obtained from patient.   Objective:   BP 115/74   Pulse 94   Wt 190 lb 6.4 oz (86.4 kg)   LMP 01/09/2018   BMI 29.82 kg/m   FHT:  154  Uterine Size:  26 cm  Fetal Movement: Movement: Present    Abdomen:  soft, gravid, appropriate for gestational age,non-tender  Vaginal:  Discharge, white. Vulvar erythema noted.  Cervix: Closed, visualized closed   Results for orders placed or performed in visit on 07/18/18 (from the past 24 hour(s))  POC Urinalysis Dipstick OB     Status: None   Collection Time: 07/18/18  4:06 PM  Result Value Ref Range   Color, UA yellow    Clarity, UA clear    Glucose, UA Negative Negative   Bilirubin, UA neg    Ketones, UA neg    Spec Grav, UA 1.025 1.010 - 1.025   Blood, UA trace    pH, UA 6.5 5.0 - 8.0   POC,PROTEIN,UA Trace Negative, Trace, Small (1+), Moderate (2+), Large (3+), 4+   Urobilinogen, UA 0.2 0.2 or 1.0 E.U./dL   Nitrite, UA neg    Leukocytes, UA Negative Negative   Appearance yellow    Odor      Assessment:   Pregnancy:  G2P1001 at [redacted]w[redacted]d  1. UTI symptoms  - POC Urinalysis Dipstick OB -  Culture, OB Urine  2. Vaginal itching  - Cervicovaginal ancillary only  3. Vaginal burning  - Cervicovaginal ancillary only  4. Second trimester pregnancy  - Culture, OB Urine - Cervicovaginal ancillary only  5. History of UTI  Plan:   Preterm labor symptoms: vaginal bleeding, contractions and leaking of fluid reviewed in detail.  Fetal movement precautions reviewed.  Vaginal swab collected, see orders.   Will send urine for repeat culture and treat with IM rocephin if indicated.   Handout given regarding abdominal support.   Rx: Terconazole    Follow up as previously scheduled or sooner if needed.    Lenda Kelp, RN  [facility] NP Student

## 2018-07-18 NOTE — Patient Instructions (Signed)
WE WOULD LOVE TO HEAR FROM YOU!!!!   Thank you Evelyn Caldwell for visiting [facility].  Providing our patients with the best experience possible is really important to [place], and we hope that you felt that on your recent visit. The most valuable feedback we get comes from [place]!!    If you receive a survey please take a couple of minutes to let us know how we did.Thank you for continuing to trust [place] with your care.   [facility]   Third Trimester of Pregnancy  The third trimester is from week 28 through week 40 (months 7 through 9). This trimester is when your unborn baby (fetus) is growing very fast. At the end of the ninth month, the unborn baby is about 20 inches in length. It weighs about 6-10 pounds. Follow these instructions at home: Medicines  Take over-the-counter and prescription medicines only as told by your doctor. Some medicines are safe and some medicines are not safe during pregnancy.  Take a prenatal vitamin that contains at least 600 micrograms (mcg) of folic acid.  If you have trouble pooping (constipation), take medicine that will make your stool soft (stool softener) if your doctor approves. Eating and drinking   Eat regular, healthy meals.  Avoid raw meat and uncooked cheese.  If you get low calcium from the food you eat, talk to your doctor about taking a daily calcium supplement.  Eat four or five small meals rather than three large meals a day.  Avoid foods that are high in fat and sugars, such as fried and sweet foods.  To prevent constipation: ? Eat foods that are high in fiber, like fresh fruits and vegetables, whole grains, and beans. ? Drink enough fluids to keep your pee (urine) clear or pale yellow. Activity  Exercise only as told by your doctor. Stop exercising if you start to have cramps.  Avoid heavy lifting, wear low heels, and sit up straight.  Do not exercise if it is too hot, too humid, or  if you are in a place of great height (high altitude).  You may continue to have sex unless your doctor tells you not to. Relieving pain and discomfort  Wear a good support bra if your breasts are tender.  Take frequent breaks and rest with your legs raised if you have leg cramps or low back pain.  Take warm water baths (sitz baths) to soothe pain or discomfort caused by hemorrhoids. Use hemorrhoid cream if your doctor approves.  If you develop puffy, bulging veins (varicose veins) in your legs: ? Wear support hose or compression stockings as told by your doctor. ? Raise (elevate) your feet for 15 minutes, 3-4 times a day. ? Limit salt in your food. Safety  Wear your seat belt when driving.  Make a list of emergency phone numbers, including numbers for family, friends, the hospital, and police and fire departments. Preparing for your baby's arrival To prepare for the arrival of your baby:  Take prenatal classes.  Practice driving to the hospital.  Visit the hospital and tour the maternity area.  Talk to your work about taking leave once the baby comes.  Pack your hospital bag.  Prepare the baby's room.  Go to your doctor visits.  Buy a rear-facing car seat. Learn how to install it in your car. General instructions  Do not use hot tubs, steam rooms, or saunas.  Do not use any products that contain nicotine or tobacco, such as  cigarettes and e-cigarettes. If you need help quitting, ask your doctor.  Do not drink alcohol.  Do not douche or use tampons or scented sanitary pads.  Do not cross your legs for long periods of time.  Do not travel for long distances unless you must. Only do so if your doctor says it is okay.  Visit your dentist if you have not gone during your pregnancy. Use a soft toothbrush to brush your teeth. Be gentle when you floss.  Avoid cat litter boxes and soil used by cats. These carry germs that can cause birth defects in the baby and can cause  a loss of your baby (miscarriage) or stillbirth.  Keep all your prenatal visits as told by your doctor. This is important. Contact a doctor if:  You are not sure if you are in labor or if your water has broken.  You are dizzy.  You have mild cramps or pressure in your lower belly.  You have a nagging pain in your belly area.  You continue to feel sick to your stomach, you throw up, or you have watery poop.  You have bad smelling fluid coming from your vagina.  You have pain when you pee. Get help right away if:  You have a fever.  You are leaking fluid from your vagina.  You are spotting or bleeding from your vagina.  You have severe belly cramps or pain.  You lose or gain weight quickly.  You have trouble catching your breath and have chest pain.  You notice sudden or extreme puffiness (swelling) of your face, hands, ankles, feet, or legs.  You have not felt the baby move in over an hour.  You have severe headaches that do not go away with medicine.  You have trouble seeing.  You are leaking, or you are having a gush of fluid, from your vagina before you are 37 weeks.  You have regular belly spasms (contractions) before you are 37 weeks. Summary  The third trimester is from week 28 through week 40 (months 7 through 9). This time is when your unborn baby is growing very fast.  Follow your doctor's advice about medicine, food, and activity.  Get ready for the arrival of your baby by taking prenatal classes, getting all the baby items ready, preparing the baby's room, and visiting your doctor to be checked.  Get help right away if you are bleeding from your vagina, or you have chest pain and trouble catching your breath, or if you have not felt your baby move in over an hour. This information is not intended to replace advice given to you by your health care provider. Make sure you discuss any questions you have with your health care provider. Document Released:  08/11/2009 Document Revised: 06/22/2016 Document Reviewed: 06/22/2016 Elsevier Interactive Patient Education  2019 [employer].

## 2018-07-20 LAB — CERVICOVAGINAL ANCILLARY ONLY
Bacterial vaginitis: NEGATIVE
Candida vaginitis: POSITIVE — AB

## 2018-07-21 ENCOUNTER — Other Ambulatory Visit: Payer: Self-pay | Admitting: Certified Nurse Midwife

## 2018-07-21 ENCOUNTER — Encounter: Payer: Self-pay | Admitting: Certified Nurse Midwife

## 2018-07-25 ENCOUNTER — Encounter: Payer: Self-pay | Admitting: *Deleted

## 2018-07-25 ENCOUNTER — Observation Stay (HOSPITAL_BASED_OUTPATIENT_CLINIC_OR_DEPARTMENT_OTHER)
Admission: EM | Admit: 2018-07-25 | Discharge: 2018-07-26 | Disposition: A | Discharge status: Home / Self Care | Payer: 59 | Source: Home / Self Care | Admitting: Obstetrics and Gynecology

## 2018-07-25 ENCOUNTER — Observation Stay: Payer: 59

## 2018-07-25 DIAGNOSIS — Z3A27 27 weeks gestation of pregnancy: Secondary | ICD-10-CM

## 2018-07-25 DIAGNOSIS — O26832 Pregnancy related renal disease, second trimester: Secondary | ICD-10-CM

## 2018-07-25 DIAGNOSIS — N200 Calculus of kidney: Secondary | ICD-10-CM

## 2018-07-25 DIAGNOSIS — N23 Unspecified renal colic: Secondary | ICD-10-CM

## 2018-07-25 DIAGNOSIS — O9989 Other specified diseases and conditions complicating pregnancy, childbirth and the puerperium: Secondary | ICD-10-CM

## 2018-07-25 DIAGNOSIS — N132 Hydronephrosis with renal and ureteral calculous obstruction: Secondary | ICD-10-CM | POA: Insufficient documentation

## 2018-07-25 DIAGNOSIS — Z87442 Personal history of urinary calculi: Secondary | ICD-10-CM | POA: Insufficient documentation

## 2018-07-25 DIAGNOSIS — O99891 Other specified diseases and conditions complicating pregnancy: Secondary | ICD-10-CM

## 2018-07-25 DIAGNOSIS — Z3490 Encounter for supervision of normal pregnancy, unspecified, unspecified trimester: Secondary | ICD-10-CM

## 2018-07-25 LAB — URINALYSIS, COMPLETE (UACMP) WITH MICROSCOPIC
Bilirubin Urine: NEGATIVE
Glucose, UA: NEGATIVE mg/dL
Ketones, ur: NEGATIVE mg/dL
Nitrite: NEGATIVE
Protein, ur: 30 mg/dL — AB
RBC / HPF: >50 RBC/hpf — ABNORMAL HIGH (ref 0–5)
Specific Gravity, Urine: 1.027 (ref 1.005–1.030)
WBC, UA: >50 WBC/hpf — ABNORMAL HIGH (ref 0–5)
pH: 5 (ref 5.0–8.0)

## 2018-07-25 NOTE — OB Triage Note (Signed)
Recvd pt from ED. Pt c/o right sided pain. Pt states she has been taking antibiotics for kidney pain since January but hasn't taken it for two weeks. Feeling baby move well. No vaginal or LOF. No contractions. Taking tylenol at home.

## 2018-07-26 ENCOUNTER — Observation Stay: Payer: 59

## 2018-07-26 ENCOUNTER — Other Ambulatory Visit: Payer: Self-pay

## 2018-07-26 ENCOUNTER — Encounter: Payer: Self-pay | Admitting: Certified Nurse Midwife

## 2018-07-26 DIAGNOSIS — Z3A27 27 weeks gestation of pregnancy: Secondary | ICD-10-CM

## 2018-07-26 DIAGNOSIS — N200 Calculus of kidney: Secondary | ICD-10-CM | POA: Diagnosis not present

## 2018-07-26 DIAGNOSIS — O26839 Pregnancy related renal disease, unspecified trimester: Secondary | ICD-10-CM | POA: Diagnosis not present

## 2018-07-26 DIAGNOSIS — O26892 Other specified pregnancy related conditions, second trimester: Secondary | ICD-10-CM | POA: Diagnosis not present

## 2018-07-26 DIAGNOSIS — N23 Unspecified renal colic: Secondary | ICD-10-CM

## 2018-07-26 DIAGNOSIS — O26832 Pregnancy related renal disease, second trimester: Secondary | ICD-10-CM

## 2018-07-26 LAB — COMPREHENSIVE METABOLIC PANEL
ALT: 11 U/L (ref 0–44)
AST: 16 U/L (ref 15–41)
Albumin: 2.6 g/dL — ABNORMAL LOW (ref 3.5–5.0)
Alkaline Phosphatase: 104 U/L (ref 38–126)
Anion gap: 9 (ref 5–15)
BUN: 10 mg/dL (ref 6–20)
CO2: 23 mmol/L (ref 22–32)
Calcium: 8.6 mg/dL — ABNORMAL LOW (ref 8.9–10.3)
Chloride: 106 mmol/L (ref 98–111)
Creatinine, Ser: 0.69 mg/dL (ref 0.44–1.00)
GFR calc Af Amer: >60 mL/min (ref >60)
GFR calc non Af Amer: >60 mL/min (ref >60)
Glucose, Bld: 106 mg/dL — ABNORMAL HIGH (ref 70–99)
Potassium: 3.4 mmol/L — ABNORMAL LOW (ref 3.5–5.1)
Sodium: 138 mmol/L (ref 135–145)
Total Bilirubin: 0.3 mg/dL (ref 0.3–1.2)
Total Protein: 6.3 g/dL — ABNORMAL LOW (ref 6.5–8.1)

## 2018-07-26 LAB — CBC
HCT: 27.7 % — ABNORMAL LOW (ref 36.0–46.0)
Hemoglobin: 9 g/dL — ABNORMAL LOW (ref 12.0–15.0)
MCH: 27.8 pg (ref 26.0–34.0)
MCHC: 32.5 g/dL (ref 30.0–36.0)
MCV: 85.5 fL (ref 80.0–100.0)
Platelets: 209 10*3/uL (ref 150–400)
RBC: 3.24 MIL/uL — ABNORMAL LOW (ref 3.87–5.11)
RDW: 13.3 % (ref 11.5–15.5)
WBC: 11.4 10*3/uL — ABNORMAL HIGH (ref 4.0–10.5)
nRBC: 0 % (ref 0.0–0.2)

## 2018-07-26 MED ORDER — ZOLPIDEM TARTRATE 5 MG PO TABS: 5.0000 mg | ORAL_TABLET | Freq: Every evening | ORAL | Status: DC | PRN

## 2018-07-26 MED ORDER — TAMSULOSIN HCL 0.4 MG PO CAPS: 0.4000 mg | ORAL_CAPSULE | Freq: Every day | ORAL | 0 refills | Status: DC

## 2018-07-26 MED ORDER — PRENATAL MULTIVITAMIN CH
1.0000 | ORAL_TABLET | Freq: Every day | ORAL | Status: DC
  Administered 2018-07-26: 1 via ORAL
  Filled 2018-07-26: qty 1

## 2018-07-26 MED ORDER — PROMETHAZINE HCL 25 MG/ML IJ SOLN: 12.5000 mg | Freq: Four times a day (QID) | INTRAMUSCULAR | Status: DC | PRN

## 2018-07-26 MED ORDER — PROMETHAZINE HCL 25 MG/ML IJ SOLN
INTRAMUSCULAR | Status: AC
  Administered 2018-07-26: 12.5 mg via INTRAMUSCULAR
  Filled 2018-07-26: qty 1

## 2018-07-26 MED ORDER — MORPHINE SULFATE (PF) 4 MG/ML IV SOLN
4.0000 mg | Freq: Once | INTRAVENOUS | Status: AC
  Administered 2018-07-26: 4 mg via INTRAMUSCULAR

## 2018-07-26 MED ORDER — PROMETHAZINE HCL 25 MG/ML IJ SOLN
12.5000 mg | Freq: Once | INTRAMUSCULAR | Status: AC
  Administered 2018-07-26: 12.5 mg via INTRAMUSCULAR

## 2018-07-26 MED ORDER — OXYCODONE-ACETAMINOPHEN 5-325 MG PO TABS: 1.0000 | ORAL_TABLET | Freq: Four times a day (QID) | ORAL | 0 refills | Status: DC | PRN

## 2018-07-26 MED ORDER — ACETAMINOPHEN 325 MG PO TABS: 650.0000 mg | ORAL_TABLET | ORAL | Status: DC | PRN

## 2018-07-26 MED ORDER — CALCIUM CARBONATE ANTACID 500 MG PO CHEW
1.0000 | CHEWABLE_TABLET | Freq: Once | ORAL | Status: AC
  Administered 2018-07-26: 200 mg via ORAL

## 2018-07-26 MED ORDER — PROMETHAZINE HCL 25 MG PO TABS
25.0000 mg | ORAL_TABLET | Freq: Four times a day (QID) | ORAL | Status: DC | PRN
  Filled 2018-07-26: qty 1

## 2018-07-26 MED ORDER — MORPHINE SULFATE (PF) 4 MG/ML IV SOLN
INTRAVENOUS | Status: AC
  Administered 2018-07-26: 4 mg via INTRAMUSCULAR
  Filled 2018-07-26: qty 1

## 2018-07-26 MED ORDER — MORPHINE SULFATE (PF) 4 MG/ML IV SOLN
4.0000 mg | INTRAVENOUS | Status: DC | PRN
  Administered 2018-07-26: 4 mg via INTRAMUSCULAR
  Filled 2018-07-26: qty 1

## 2018-07-26 MED ORDER — ONDANSETRON 4 MG PO TBDP: 4.0000 mg | ORAL_TABLET | Freq: Four times a day (QID) | ORAL | Status: DC | PRN

## 2018-07-26 MED ORDER — CALCIUM CARBONATE ANTACID 500 MG PO CHEW: 200.0000 mg | CHEWABLE_TABLET | ORAL | Status: DC | PRN

## 2018-07-26 MED ORDER — OXYCODONE-ACETAMINOPHEN 5-325 MG PO TABS: 1.0000 | ORAL_TABLET | Freq: Four times a day (QID) | ORAL | Status: DC | PRN

## 2018-07-26 MED ORDER — CALCIUM CARBONATE ANTACID 500 MG PO CHEW
CHEWABLE_TABLET | ORAL | Status: AC
  Administered 2018-07-26: 200 mg via ORAL
  Filled 2018-07-26: qty 1

## 2018-07-26 MED ORDER — DOCUSATE SODIUM 100 MG PO CAPS
100.0000 mg | ORAL_CAPSULE | Freq: Every day | ORAL | Status: DC
  Administered 2018-07-26: 100 mg via ORAL
  Filled 2018-07-26: qty 1

## 2018-07-26 MED ORDER — HYDROMORPHONE HCL 2 MG PO TABS: 2.0000 mg | ORAL_TABLET | ORAL | Status: DC | PRN

## 2018-07-26 NOTE — H&P (Signed)
Obstetric History and Physical  Evelyn Caldwell is a 28 y.o. G2P1001 with IUP at [redacted]w[redacted]d presenting with right sided back pain, notes has been taking antibiotics to treatment urinary tract infections since 05/2018.   Patient states she has been having  none contractions, none vaginal bleeding, intact membranes, with active fetal movement.  Denies difficulty breathing or respiratory distress, chest pain, abdominal pain, and leg pain or swelling.     Prenatal Course  Source of Care: [facility]-initial visit at 7 weeks, total visits: 6  Pregnancy complications or risks: History of UTI in pregnancy, History of kidney stones, History of hydronephrosis, Rh positive  Prenatal labs and studies:  ABO, Rh: A/Positive/-- 03/05/23 1013)  Antibody: Negative (10/29 1002)  Rubella: 3.00 03/05/23 1013)  RPR: Non Reactive 03/05/2023 1013)   HBsAg: Negative 03-05-2023 1013)   HIV: Non Reactive 2023/03/05 1013)   GBS: Unknown  1 hr Glucola: Scheduled in office   Genetic screening normal   Anatomy US normal  Past Medical History:  Diagnosis Date  . Kidney stone   . Vulvar lesion     Past Surgical History:  Procedure Laterality Date  . none      OB History  Gravida Para Term Preterm AB Living  2 1 1     1   SAB TAB Ectopic Multiple Live Births          1    # Outcome Date GA Lbr Len/2nd Weight Sex Delivery Anes PTL Lv  2 Current           1 Term 03/09/16 [redacted]w[redacted]d  3674 g F Vag-Spont   LIV    Social History   Socioeconomic History  . Marital status: Married    Spouse name: Not on file  . Number of children: Not on file  . Years of education: Not on file  . Highest education level: Not on file  Occupational History  . Not on file  Social Needs  . Financial resource strain: Not on file  . Food insecurity:    Worry: Not on file    Inability: Not on file  . Transportation needs:    Medical: Not on file    Non-medical: Not on file  Tobacco Use  . Smoking status: Never Smoker  .  Smokeless tobacco: Never Used  Substance and Sexual Activity  . Alcohol use: No    Comment: occasionally  . Drug use: No  . Sexual activity: Yes    Birth control/protection: None  Lifestyle  . Physical activity:    Days per week: Not on file    Minutes per session: Not on file  . Stress: Not on file  Relationships  . Social connections:    Talks on phone: Not on file    Gets together: Not on file    Attends religious service: Not on file    Active member of club or organization: Not on file    Attends meetings of clubs or organizations: Not on file    Relationship status: Not on file  Other Topics Concern  . Not on file  Social History Narrative  . Not on file    Family History  Problem Relation Age of Onset  . Congestive Heart Failure Maternal Grandfather   . Parkinson's disease Paternal Grandmother   . Kidney failure Cousin   . Kidney cancer Neg Hx   . Prostate cancer Neg Hx     Medications Prior to Admission  Medication Sig Dispense Refill Last Dose  .  acetaminophen (TYLENOL) 500 MG tablet Take 1,000 mg by mouth every 6 (six) hours as needed.     . Butalbital-APAP-Caffeine 50-325-40 MG capsule Take 1-2 capsules by mouth every 6 (six) hours as needed for headache. 30 capsule 0 Past Month at Unknown time  . magnesium oxide (MAG-OX) 400 (241.3 Mg) MG tablet Take 1 tablet (400 mg total) by mouth 2 (two) times daily as needed. 60 tablet 2 Past Month at Unknown time  . Prenatal Vit-Fe Fumarate-FA (PRENATAL MULTIVITAMIN) TABS tablet Take 1 tablet by mouth daily at 12 noon.   07/24/2018 at Unknown time  . terconazole (TERAZOL 7) 0.4 % vaginal cream Place 1 applicator vaginally at bedtime. 45 g 0 Past Week at Unknown time    No Known Allergies  Review of Systems: Negative except for what is mentioned in HPI.  Physical Exam:  BP 130/74   Pulse 100   Temp 98 F (36.7 C) (Oral)   Resp 16   LMP 01/09/2018    GENERAL: Alert and oriented x 4.   FHT:  Baseline rate 150  bpm   Variability moderate  Accelerations present   Decelerations none  Contractions: Irritability noted   RENAL / URINARY TRACT ULTRASOUND COMPLETE  COMPARISON:  Renal ultrasound dated 02/23/2018  FINDINGS: Right Kidney:  Renal measurements: 13.2 x 5.9 x 5.7 cm = volume: 233 mL. There is mild increased echogenicity. There are 2 stones noted in the region of the ureteropelvic junction with the larger measuring approximately 9 mm. There is a mild-to-moderate right hydronephrosis.  Left Kidney:  Renal measurements: 12.7 x 5.8 x 5.4 cm = volume: 207 mL. Normal echogenicity. No hydronephrosis or shadowing stone.  Bladder:  The urinary bladder is not well visualized.  IMPRESSION: Mild-to-moderate right hydronephrosis, likely secondary to right UPJ calculi.   Electronically Signed   By: Anner Crete M.D.   On: 07/26/2018 00:36    Pertinent Labs/Studies:   Results for orders placed or performed during the hospital encounter of 07/25/18 (from the past 24 hour(s))  Urinalysis, Complete w Microscopic     Status: Abnormal   Collection Time: 07/25/18 11:24 PM  Result Value Ref Range   Color, Urine YELLOW (A) YELLOW   APPearance HAZY (A) CLEAR   Specific Gravity, Urine 1.027 1.005 - 1.030   pH 5.0 5.0 - 8.0   Glucose, UA NEGATIVE NEGATIVE mg/dL   Hgb urine dipstick MODERATE (A) NEGATIVE   Bilirubin Urine NEGATIVE NEGATIVE   Ketones, ur NEGATIVE NEGATIVE mg/dL   Protein, ur 30 (A) NEGATIVE mg/dL   Nitrite NEGATIVE NEGATIVE   Leukocytes,Ua MODERATE (A) NEGATIVE   RBC / HPF >50 (H) 0 - 5 RBC/hpf   WBC, UA >50 (H) 0 - 5 WBC/hpf   Bacteria, UA RARE (A) NONE SEEN   Squamous Epithelial / LPF 6-10 0 - 5   Mucus PRESENT    Uric Acid Crys, UA PRESENT    Crystals PRESENT (A) NEGATIVE    Assessment :  Evelyn Caldwell is a 28 y.o. G2P1001 at [redacted]w[redacted]d under observation for pain management due to kidney stones in the antepartum period, Rh positive, History UTI  in pregnancy, GBS unknown  FHR Category I  Plan:  Place in 24 hour observation for pain management, see orders.   Collect CBC and CMP.   Strain all urine.   No cervical exams.   Urology consult.   Dr. Amalia Hailey notified of plan of care.    Diona Fanti, CNM [facility], [facility]  07/26/18 5:22 AM

## 2018-07-26 NOTE — Progress Notes (Signed)
[facility] sent to [facility] for q shift 30 minute fetal monitoring.

## 2018-07-26 NOTE — Progress Notes (Signed)
Patient discharged home with husband. Discharge instructions and prescriptions given and reviewed with patient. Patient instructed to follow up with [facility] on Friday- to call first thing tomorrow morning since late discharge. Patient verbalized understanding. Will be escorted out by staff.

## 2018-07-26 NOTE — Progress Notes (Signed)
NST INTERPRETATION:   In pt hospital for kidney stones.   Mode: External Baseline Rate (A): 155 bpm Variability: Moderate Accelerations: 15 x 15 Decelerations: None     Contraction Frequency (min): none   Impression: reactive   Plan: Pt going for CT scan    Philip Aspen, CNM

## 2018-07-26 NOTE — Progress Notes (Signed)
Dani Gobble notified she needs to call urologist on call about consult placed. See new orders.

## 2018-07-26 NOTE — Consult Note (Signed)
Urology Consult  I have been asked to see the patient by Eulis Manly, nurse midwife, for evaluation and management of kidney stone and pregnancy.  Chief Complaint: Right-sided flank pain and pelvic pressure  History of Present Illness: Evelyn Caldwell is a 28 y.o. year old he is currently 27 weeks gravid admitted overnight with concern for possible kidney stone.  She was seen first thing this morning.  This morning, she reported that she has been struggling with intermittent episodes of flank pain on and off since January.  More recently, the pain become more severe leading to her admission overnight.  She also reports feeling pressure in her pelvic area as well as urinary frequency.  She is also been treated for several presumed UTIs.  Most recently, she did in fact grow Enterococcus faecalis for which she was appropriately treated on 07/10/2026.  At the time, she was having increased urgency and pressure without dysuria.  She is also had occasional episodes of gross hematuria.  She has had a poor appetite over the past 24 hours but otherwise no significant nausea or vomiting.  Renal ultrasound the time of admission showed mild to moderate right hydroureteronephrosis with some echogenicity/possible calcifications near the level of the UPJ.  The left kidney was normal.  Mild leukocytosis of 11.4.  Normal.  Afebrile and stable.  UA had rare bacteria but numerous red and white blood cells as well as presence of crystals.  She does have a personal history of kidney stones including passed a kidney stone during her previous pregnancy in 2017.  This was in her third trimester.  She never required surgical intervention for this.  After lengthy discussion earlier today about how to proceed including continue conservative management consideration of nephrostomy tube versus stent, CT scan for further diagnostic evaluation versus going ahead and pursuing ureteroscopy for presumed stone, she  ultimately elected to have a noncontrast CT scan to help guide her decision making.  We discussed the risk and benefits of this in detail.  We discussed the risk of radiation to the fetus which does slightly increase the risk for malignancy amongst other things.  She did sign a consent form for this today.   Past Medical History:  Diagnosis Date  . Kidney stone   . Vulvar lesion     Past Surgical History:  Procedure Laterality Date  . none      Home Medications:  Current Meds  Medication Sig  . acetaminophen (TYLENOL) 500 MG tablet Take 1,000 mg by mouth every 6 (six) hours as needed.  . Butalbital-APAP-Caffeine 50-325-40 MG capsule Take 1-2 capsules by mouth every 6 (six) hours as needed for headache.  . magnesium oxide (MAG-OX) 400 (241.3 Mg) MG tablet Take 1 tablet (400 mg total) by mouth 2 (two) times daily as needed.  . Prenatal Vit-Fe Fumarate-FA (PRENATAL MULTIVITAMIN) TABS tablet Take 1 tablet by mouth daily at 12 noon.  Marland Kitchen terconazole (TERAZOL 7) 0.4 % vaginal cream Place 1 applicator vaginally at bedtime.    Allergies: No Known Allergies  Family History  Problem Relation Age of Onset  . Congestive Heart Failure Maternal Grandfather   . Parkinson's disease Paternal Grandmother   . Kidney failure Cousin   . Kidney cancer Neg Hx   . Prostate cancer Neg Hx     Social History:  reports that she has never smoked. She has never used smokeless tobacco. She reports that she does not drink alcohol or use drugs.  ROS: A  complete review of systems was performed.  All systems are negative except for pertinent findings as noted.  Physical Exam:  Vital signs in last 24 hours: Temp:  [98 F (36.7 C)-98.6 F (37 C)] 98.6 F (37 C) (02/26 1639) Pulse Rate:  [100-120] 112 (02/26 1723) Resp:  [16-18] 18 (02/26 1639) BP: (112-130)/(52-75) 119/70 (02/26 1723) SpO2:  [97 %-99 %] 98 % (02/26 1639) Weight:  [84.8 kg] 84.8 kg (02/26 0722) Constitutional:  Alert and oriented, No  acute distress.  Husband at bedside.  Pleasant.  Appears to be comfortable. HEENT: [place] AT, moist mucus membranes.  Trachea midline, no masses Cardiovascular: Regular rate and rhythm, no clubbing, cyanosis, or edema. Respiratory: Normal respiratory effort, lungs clear bilaterally GI: Abdomen is soft, gravid uterus nontender. GU: No CVA tenderness Lymph: No cervical or inguinal adenopathy Neurologic: Grossly intact, no focal deficits, moving all 4 extremities Psychiatric: Normal mood and affect   Laboratory Data:  Recent Labs    07/26/18 0618  WBC 11.4*  HGB 9.0*  HCT 27.7*   Recent Labs    07/26/18 0618  NA 138  K 3.4*  CL 106  CO2 23  GLUCOSE 106*  BUN 10  CREATININE 0.69  CALCIUM 8.6*   No results for input(s): LABPT, INR in the last 72 hours. No results for input(s): LABURIN in the last 72 hours. Results for orders placed or performed in visit on 07/10/18  Urine Culture     Status: Abnormal   Collection Time: 07/10/18  3:25 PM  Result Value Ref Range Status   Urine Culture, Routine Final report (A)  Final   Organism ID, Bacteria Enterococcus faecalis (A)  Final    Comment: Greater than 100,000 colony forming units per mL   Antimicrobial Susceptibility Comment  Final    Comment:       ** S = Susceptible; I = Intermediate; R = Resistant **                    P = Positive; N = Negative             MICS are expressed in micrograms per mL    Antibiotic                 RSLT#1    RSLT#2    RSLT#3    RSLT#4 Ciprofloxacin                  S Levofloxacin                   S Nitrofurantoin                 I Penicillin                     S Tetracycline                   R Vancomycin                     S      Radiologic Imaging: US Renal  Result Date: 07/26/2018 CLINICAL DATA:  28 year old female with right flank pain. EXAM: RENAL / URINARY TRACT ULTRASOUND COMPLETE COMPARISON:  Renal ultrasound dated 02/23/2018 FINDINGS: Right Kidney: Renal measurements: 13.2 x 5.9 x  5.7 cm = volume: 233 mL. There is mild increased echogenicity. There are 2 stones noted in the region of the ureteropelvic junction with the larger measuring approximately 9 mm.  There is a mild-to-moderate right hydronephrosis. Left Kidney: Renal measurements: 12.7 x 5.8 x 5.4 cm = volume: 207 mL. Normal echogenicity. No hydronephrosis or shadowing stone. Bladder: The urinary bladder is not well visualized. IMPRESSION: Mild-to-moderate right hydronephrosis, likely secondary to right UPJ calculi. Electronically Signed   By: Anner Crete M.D.   On: 07/26/2018 00:36   Ct Renal Stone Study  Result Date: 07/26/2018 CLINICAL DATA:  Right flank pain since January with gross hematuria. EXAM: CT ABDOMEN AND PELVIS WITHOUT CONTRAST TECHNIQUE: Multidetector CT imaging of the abdomen and pelvis was performed following the standard protocol without IV contrast. COMPARISON:  06/03/2014 FINDINGS: Lower chest: No acute abnormality. Hepatobiliary: No focal liver abnormality is seen. No gallstones, gallbladder wall thickening, or biliary dilatation. Pancreas: Unremarkable. No pancreatic ductal dilatation or surrounding inflammatory changes. Spleen: Normal in size without focal abnormality. Adrenals/Urinary Tract: Normal appearance of the adrenal glands. Bilateral nephrolithiasis identified. There is right-sided nephromegaly, perinephric fat stranding and hydronephrosis with hydroureter. Within the distal right ureter there is a stone which measures 6 x 7 by 10 mm. This is just proximal to the right UVJ. Urinary bladder appears normal. No left hydronephrosis or hydroureter. Stomach/Bowel: Stomach normal. No abnormal small or large bowel dilatation identified. Vascular/Lymphatic: Normal appearance of the abdominal aorta. No adenopathy identified. No pelvic or inguinal adenopathy. Reproductive: Gravid uterus compatible with [redacted] week gestation is identified. Other: No free fluid or fluid collections. Musculoskeletal: No acute or  significant osseous findings. IMPRESSION: 1. Large distal right ureteral calculus measures 6 x 7 x 10 mm. This results in right-sided hydronephrosis and hydroureter. 2. Bilateral nephrolithiasis. 3. Gravid uterus compatible with a [redacted] week gestation. Electronically Signed   By: Kerby Moors M.D.   On: 07/26/2018 14:38   CT scan was personally reviewed today.  Agree with radiologic interpretation.  Impression/Assessment:  28 year old female [redacted] weeks pregnant with a 1 cm x 6 cm right UVJ stone.  We discussed the findings after the CT scan was complete.  This point time, she is fairly comfortable and her pain is been well controlled today with minimal pain medications.  We discussed that in general, 1 cm stone has a fairly low risk of passing, however, the stone was made all the way down to the UVJ and is only about 6 mm in width (long thin stone).  In addition, there is increased ureteral dilation during pregnancy thus she may in fact have a chance of passing the stone spontaneously.  Alternatively, she was offered ureteroscopy to treat her distal stone.  Again, risk and benefits were discussed in detail regarding each of the treatment options.  Stent versus nephrostomy tube were also discussed but less desirable given that they will not lead to definitive treatment of her pain or stone.  Ultimately, she elected to pursue medical expulsive therapy with Flomax and pain meds as needed over the next week and push fluids.  We will tentatively book her for outpatient ureteroscopy next week and if she passes the stone in the interim, we will cancel surgery.  We did go ahead and discussed the risk of ureteroscopy including the risk of bleeding, infection, damage surrounding structures, need for multiple procedures, and specifically during pregnancy, preterm labor.  We will try to do this under ultrasound guidance if possible to limit her exposure to radiation but we may need to use a few x-rays as needed.  All of  her questions were answered.  Plan:  -Okay to discharge the patient today with pain  meds and Flomax -Encourage hydration continue to strain all urine -We will follow-up urine culture -Warning symptoms reviewed as well as indications for more urgent or emergent intervention were discussed -We will tentatively book her for surgery next week as discussed above for right ureteroscopy, laser lithotripsy and stent placement  07/26/2018, 5:30 PM  [name],  MD   Case was discussed both with Dani Gobble, certified nurse midwife earlier this morning and later in a [place] Silos, certified midwife this afternoon and evening.

## 2018-07-26 NOTE — Discharge Summary (Signed)
Antenatal  Discharge Summary  Patient ID: Evelyn Caldwell MRN: [id number] DOB/AGE: April 15, 1991 28 y.o.  Admit date: 07/25/2018 Discharge date: 07/26/2018  Admission Diagnoses: kidney stone  Discharge Diagnoses:  Kidney stone  Prenatal Procedures: NST  Consults: Urology  Hospital Course:  This is a 28 y.o. G2P1001 with IUP at [redacted]w[redacted]d admitted for kidney stone She was admitted with no contractions. Cervical exam not indicated. No leaking of fluid and no bleeding.  She completed ultrasound and CT scan that showed : There is right-sided nephromegaly, perinephric fat stranding and hydronephrosis with hydroureter. Within the distal right ureter there is a stone which measures 6 x 7 by 10 mm. This is just proximal to the right UVJ. Urinary bladder appears normal. No left hydronephrosis or Hydroureter.  Discharge Exam: Temp:  [98 F (36.7 C)-98.6 F (37 C)] 98.6 F (37 C) (02/26 1639) Pulse Rate:  [100-120] 120 (02/26 1639) Resp:  [16-18] 18 (02/26 1639) BP: (112-130)/(52-75) 120/62 (02/26 1639) SpO2:  [97 %-99 %] 98 % (02/26 1639) Weight:  [84.8 kg] 84.8 kg (02/26 0722) Physical Examination: CONSTITUTIONAL: Well-developed, well-nourished female in no acute distress.  HENT:  Normocephalic, atraumatic, External right and left ear normal. Oropharynx is clear and moist EYES: Conjunctivae and EOM are normal. Pupils are equal, round, and reactive to light. No scleral icterus.  NECK: Normal range of motion, supple, no masses SKIN: Skin is warm and dry. No rash noted. Not diaphoretic. No erythema. No pallor. [place]: Alert and oriented to person, place, and time. Normal reflexes, muscle tone coordination. No cranial nerve deficit noted. PSYCHIATRIC: Normal mood and affect. Normal behavior. Normal judgment and thought content. CARDIOVASCULAR: Normal heart rate noted, regular rhythm RESPIRATORY: Effort and breath sounds normal, no problems with respiration noted MUSCULOSKELETAL: Normal  range of motion. No edema and no tenderness. 2+ distal pulses. ABDOMEN: Soft, nontender, nondistended, gravid. CERVIX:  not indicated   Fetal monitoring: FHR: 155  bpm, Variability: moderate, Accelerations: Present, Decelerations: Absent  Uterine activity: no contractions per hour  Significant Diagnostic Studies:  Results for orders placed or performed during the hospital encounter of 07/25/18 (from the past 168 hour(s))  Urinalysis, Complete w Microscopic   Collection Time: 07/25/18 11:24 PM  Result Value Ref Range   Color, Urine YELLOW (A) YELLOW   APPearance HAZY (A) CLEAR   Specific Gravity, Urine 1.027 1.005 - 1.030   pH 5.0 5.0 - 8.0   Glucose, UA NEGATIVE NEGATIVE mg/dL   Hgb urine dipstick MODERATE (A) NEGATIVE   Bilirubin Urine NEGATIVE NEGATIVE   Ketones, ur NEGATIVE NEGATIVE mg/dL   Protein, ur 30 (A) NEGATIVE mg/dL   Nitrite NEGATIVE NEGATIVE   Leukocytes,Ua MODERATE (A) NEGATIVE   RBC / HPF >50 (H) 0 - 5 RBC/hpf   WBC, UA >50 (H) 0 - 5 WBC/hpf   Bacteria, UA RARE (A) NONE SEEN   Squamous Epithelial / LPF 6-10 0 - 5   Mucus PRESENT    Uric Acid Crys, UA PRESENT    Crystals PRESENT (A) NEGATIVE  CBC   Collection Time: 07/26/18  6:18 AM  Result Value Ref Range   WBC 11.4 (H) 4.0 - 10.5 K/uL   RBC 3.24 (L) 3.87 - 5.11 MIL/uL   Hemoglobin 9.0 (L) 12.0 - 15.0 g/dL   HCT 27.7 (L) 36.0 - 46.0 %   MCV 85.5 80.0 - 100.0 fL   MCH 27.8 26.0 - 34.0 pg   MCHC 32.5 30.0 - 36.0 g/dL   RDW 13.3 11.5 - 15.5 %  Platelets 209 150 - 400 K/uL   nRBC 0.0 0.0 - 0.2 %  Comprehensive metabolic panel   Collection Time: 07/26/18  6:18 AM  Result Value Ref Range   Sodium 138 135 - 145 mmol/L   Potassium 3.4 (L) 3.5 - 5.1 mmol/L   Chloride 106 98 - 111 mmol/L   CO2 23 22 - 32 mmol/L   Glucose, Bld 106 (H) 70 - 99 mg/dL   BUN 10 6 - 20 mg/dL   Creatinine, Ser 0.69 0.44 - 1.00 mg/dL   Calcium 8.6 (L) 8.9 - 10.3 mg/dL   Total Protein 6.3 (L) 6.5 - 8.1 g/dL   Albumin 2.6 (L) 3.5 -  5.0 g/dL   AST 16 15 - 41 U/L   ALT 11 0 - 44 U/L   Alkaline Phosphatase 104 38 - 126 U/L   Total Bilirubin 0.3 0.3 - 1.2 mg/dL   GFR calc non Af Amer >60 >60 mL/min   GFR calc Af Amer >60 >60 mL/min   Anion gap 9 5 - 15   US Renal  Result Date: 07/26/2018 CLINICAL DATA:  28 year old female with right flank pain. EXAM: RENAL / URINARY TRACT ULTRASOUND COMPLETE COMPARISON:  Renal ultrasound dated 02/23/2018 FINDINGS: Right Kidney: Renal measurements: 13.2 x 5.9 x 5.7 cm = volume: 233 mL. There is mild increased echogenicity. There are 2 stones noted in the region of the ureteropelvic junction with the larger measuring approximately 9 mm. There is a mild-to-moderate right hydronephrosis. Left Kidney: Renal measurements: 12.7 x 5.8 x 5.4 cm = volume: 207 mL. Normal echogenicity. No hydronephrosis or shadowing stone. Bladder: The urinary bladder is not well visualized. IMPRESSION: Mild-to-moderate right hydronephrosis, likely secondary to right UPJ calculi. Electronically Signed   By: Anner Crete M.D.   On: 07/26/2018 00:36   Ct Renal Stone Study  Result Date: 07/26/2018 CLINICAL DATA:  Right flank pain since January with gross hematuria. EXAM: CT ABDOMEN AND PELVIS WITHOUT CONTRAST TECHNIQUE: Multidetector CT imaging of the abdomen and pelvis was performed following the standard protocol without IV contrast. COMPARISON:  06/03/2014 FINDINGS: Lower chest: No acute abnormality. Hepatobiliary: No focal liver abnormality is seen. No gallstones, gallbladder wall thickening, or biliary dilatation. Pancreas: Unremarkable. No pancreatic ductal dilatation or surrounding inflammatory changes. Spleen: Normal in size without focal abnormality. Adrenals/Urinary Tract: Normal appearance of the adrenal glands. Bilateral nephrolithiasis identified. There is right-sided nephromegaly, perinephric fat stranding and hydronephrosis with hydroureter. Within the distal right ureter there is a stone which measures 6 x 7 by  10 mm. This is just proximal to the right UVJ. Urinary bladder appears normal. No left hydronephrosis or hydroureter. Stomach/Bowel: Stomach normal. No abnormal small or large bowel dilatation identified. Vascular/Lymphatic: Normal appearance of the abdominal aorta. No adenopathy identified. No pelvic or inguinal adenopathy. Reproductive: Gravid uterus compatible with [redacted] week gestation is identified. Other: No free fluid or fluid collections. Musculoskeletal: No acute or significant osseous findings. IMPRESSION: 1. Large distal right ureteral calculus measures 6 x 7 x 10 mm. This results in right-sided hydronephrosis and hydroureter. 2. Bilateral nephrolithiasis. 3. Gravid uterus compatible with a [redacted] week gestation. Electronically Signed   By: Kerby Moors M.D.   On: 07/26/2018 14:38    Future Appointments  Date Time Provider [place]  07/27/2018  8:00 AM [facility] None  07/27/2018  8:45 AM [place], Lara Mulch, CNM [facility] None    Discharge Condition: Stable, return to clinic on Friday. She is to strain all urine at home.  If patient does not pass stone she will be scheduled for surgery. Dr. Caryl Pina consulted and spoke with pt regarding plan of care. She will schedule the procedure.     Discharge Instructions    Strain all urine   Complete by:  As directed    Send home with strainer     Allergies as of 07/26/2018   No Known Allergies     Medication List    TAKE these medications   acetaminophen 500 MG tablet Commonly known as:  TYLENOL Take 1,000 mg by mouth every 6 (six) hours as needed.   Butalbital-APAP-Caffeine 50-325-40 MG capsule Take 1-2 capsules by mouth every 6 (six) hours as needed for headache.   magnesium oxide 400 (241.3 Mg) MG tablet Commonly known as:  MAG-OX Take 1 tablet (400 mg total) by mouth 2 (two) times daily as needed.   oxyCODONE-acetaminophen 5-325 MG tablet Commonly known as:  PERCOCET/ROXICET Take 1-2 tablets by mouth every 6  (six) hours as needed for moderate pain or severe pain.   prenatal multivitamin Tabs tablet Take 1 tablet by mouth daily at 12 noon.   tamsulosin 0.4 MG Caps capsule Commonly known as:  FLOMAX Take 1 capsule (0.4 mg total) by mouth daily.   terconazole 0.4 % vaginal cream Commonly known as:  TERAZOL 7 Place 1 applicator vaginally at bedtime.        Signed: Philip Aspen CNM  07/26/2018, 4:43 PM

## 2018-07-26 NOTE — Discharge Instructions (Signed)
Kidney Stones  Kidney stones (urolithiasis) are rock-like masses that form inside of the kidneys. Kidneys are organs that make pee (urine). A kidney Evelyn Caldwell can cause very bad pain and can block the flow of pee. The Evelyn Caldwell usually leaves your body (passes) through your pee. You may need to have a doctor take out the Evelyn Caldwell. Follow these instructions at home: Eating and drinking  Drink enough fluid to keep your pee clear or pale yellow. This will help you pass the Evelyn Caldwell.  If told by your doctor, change the foods you eat (your diet). This may include: ? Limiting how much salt (sodium) you eat. ? Eating more fruits and vegetables. ? Limiting how much meat, poultry, fish, and eggs you eat.  Follow instructions from your doctor about eating or drinking restrictions. General instructions  Collect pee samples as told by your doctor. You may need to collect a pee sample: ? 24 hours after a Evelyn Caldwell comes out. ? 8-12 weeks after a Evelyn Caldwell comes out, and every 6-12 months after that.  Strain your pee every time you pee (urinate), for as long as told. Use the strainer that your doctor recommends.  Do not throw out the Evelyn Caldwell. Keep it so that it can be tested by your doctor.  Take over-the-counter and prescription medicines only as told by your doctor.  Keep all follow-up visits as told by your doctor. This is important. You may need follow-up tests. Preventing kidney stones To prevent another kidney Evelyn Caldwell:  Drink enough fluid to keep your pee clear or pale yellow. This is the best way to prevent kidney stones.  Eat healthy foods.  Avoid certain foods as told by your doctor. You may be told to eat less protein.  Stay at a healthy weight. Contact a doctor if:  You have pain that gets worse or does not get better with medicine. Get help right away if:  You have a fever or chills.  You get very bad pain.  You get new pain in your belly (abdomen).  You pass out (faint).  You cannot pee. This  information is not intended to replace advice given to you by your health care provider. Make sure you discuss any questions you have with your health care provider. Document Released: 11/03/2007 Document Revised: 02/03/2016 Document Reviewed: 02/03/2016 Elsevier Interactive Patient Education  2019 [employer]  

## 2018-07-26 NOTE — Progress Notes (Signed)
[facility] COMPREHENSIVE PROGRESS NOTE  Evelyn Caldwell is a 28 y.o. G2P1001 at [redacted]w[redacted]d who is admitted for observation due to kidney stones.  Estimated Date of Delivery: 10/20/18   Length of Stay:  0 Days. Admitted 07/25/2018  Subjective: Pt doing better now that she has pain medication.  Patient reports good fetal movement.  She reports no uterine contractions, no bleeding and no loss of fluid per vagina.  Vitals:  Blood pressure (!) 117/52, pulse (!) 112, temperature 98 F (36.7 C), resp. rate 18, last menstrual period 01/09/2018, SpO2 98 %, currently breastfeeding. Physical Examination: CONSTITUTIONAL: Well-developed, well-nourished female in no acute distress.  HENT:  Normocephalic, atraumatic, External right and left ear normal. Oropharynx is clear and moist EYES: Conjunctivae and EOM are normal. Pupils are equal, round, and reactive to light. No scleral icterus.  NECK: Normal range of motion, supple, no masses SKIN: Skin is warm and dry. No rash noted. Not diaphoretic. No erythema. No pallor. [place]: Alert and oriented to person, place, and time. Normal reflexes, muscle tone coordination. No cranial nerve deficit noted. PSYCHIATRIC: Normal mood and affect. Normal behavior. Normal judgment and thought content. CARDIOVASCULAR: Normal heart rate noted, regular rhythm RESPIRATORY: Effort and breath sounds normal, no problems with respiration noted MUSCULOSKELETAL: Normal range of motion. No edema and no tenderness. 2+ distal pulses. ABDOMEN: Soft, nontender, nondistended, gravid. CERVIX:  Deferred   Fetal monitoring: FHR: 150 bpm, Variability: moderate, Accelerations: Present 10 x10 , Decelerations: Absent  Uterine activity: nocontractions per hour  Results for orders placed or performed during the hospital encounter of 07/25/18 (from the past 48 hour(s))  Urinalysis, Complete w Microscopic     Status: Abnormal   Collection Time: 07/25/18 11:24 PM  Result  Value Ref Range   Color, Urine YELLOW (A) YELLOW   APPearance HAZY (A) CLEAR   Specific Gravity, Urine 1.027 1.005 - 1.030   pH 5.0 5.0 - 8.0   Glucose, UA NEGATIVE NEGATIVE mg/dL   Hgb urine dipstick MODERATE (A) NEGATIVE   Bilirubin Urine NEGATIVE NEGATIVE   Ketones, ur NEGATIVE NEGATIVE mg/dL   Protein, ur 30 (A) NEGATIVE mg/dL   Nitrite NEGATIVE NEGATIVE   Leukocytes,Ua MODERATE (A) NEGATIVE   RBC / HPF >50 (H) 0 - 5 RBC/hpf   WBC, UA >50 (H) 0 - 5 WBC/hpf   Bacteria, UA RARE (A) NONE SEEN   Squamous Epithelial / LPF 6-10 0 - 5   Mucus PRESENT    Uric Acid Crys, UA PRESENT    Crystals PRESENT (A) NEGATIVE    Comment: Performed at [facility], [address] [place], [place] [address]  CBC     Status: Abnormal   Collection Time: 07/26/18  6:18 AM  Result Value Ref Range   WBC 11.4 (H) 4.0 - 10.5 K/uL   RBC 3.24 (L) 3.87 - 5.11 MIL/uL   Hemoglobin 9.0 (L) 12.0 - 15.0 g/dL   HCT 27.7 (L) 36.0 - 46.0 %   MCV 85.5 80.0 - 100.0 fL   MCH 27.8 26.0 - 34.0 pg   MCHC 32.5 30.0 - 36.0 g/dL   RDW 13.3 11.5 - 15.5 %   Platelets 209 150 - 400 K/uL   nRBC 0.0 0.0 - 0.2 %    Comment: Performed at [facility], [address] [place], [place] [address]  Comprehensive metabolic panel     Status: Abnormal   Collection Time: 07/26/18  6:18 AM  Result Value Ref Range   Sodium 138  135 - 145 mmol/L   Potassium 3.4 (L) 3.5 - 5.1 mmol/L   Chloride 106 98 - 111 mmol/L   CO2 23 22 - 32 mmol/L   Glucose, Bld 106 (H) 70 - 99 mg/dL   BUN 10 6 - 20 mg/dL   Creatinine, Ser 0.69 0.44 - 1.00 mg/dL   Calcium 8.6 (L) 8.9 - 10.3 mg/dL   Total Protein 6.3 (L) 6.5 - 8.1 g/dL   Albumin 2.6 (L) 3.5 - 5.0 g/dL   AST 16 15 - 41 U/L   ALT 11 0 - 44 U/L   Alkaline Phosphatase 104 38 - 126 U/L   Total Bilirubin 0.3 0.3 - 1.2 mg/dL   GFR calc non Af Amer >60 >60 mL/min   GFR calc Af Amer >60 >60 mL/min   Anion gap 9 5 - 15    Comment: Performed at [facility], [address] [place], [place] [address]    US Renal  Result Date: 07/26/2018 CLINICAL DATA:  28 year old female with right flank pain. EXAM: RENAL / URINARY TRACT ULTRASOUND COMPLETE COMPARISON:  Renal ultrasound dated 02/23/2018 FINDINGS: Right Kidney: Renal measurements: 13.2 x 5.9 x 5.7 cm = volume: 233 mL. There is mild increased echogenicity. There are 2 stones noted in the region of the ureteropelvic junction with the larger measuring approximately 9 mm. There is a mild-to-moderate right hydronephrosis. Left Kidney: Renal measurements: 12.7 x 5.8 x 5.4 cm = volume: 207 mL. Normal echogenicity. No hydronephrosis or shadowing stone. Bladder: The urinary bladder is not well visualized. IMPRESSION: Mild-to-moderate right hydronephrosis, likely secondary to right UPJ calculi. Electronically Signed   By: Anner Crete M.D.   On: 07/26/2018 00:36    Current scheduled medications . docusate sodium  100 mg Oral Daily  . prenatal multivitamin  1 tablet Oral Q1200    I have reviewed the patient's current medications.  ASSESSMENT: Active Problems:   Pregnancy   Pregnancy complicated by nephrolithiasis, antepartum   PLAN: Urology consult.    Continue routine antenatal care.   Philip Aspen, CNM

## 2018-07-26 NOTE — Progress Notes (Signed)
Pt from room 335 for her q shift NST. Pt denies VB, LOF and states positive fetal movement. Vitals WDL. Monitors applied and assessing.

## 2018-07-27 ENCOUNTER — Encounter
Admission: RE | Disposition: A | Discharge status: Other Acute Inpatient Hospital | Payer: Self-pay | Source: Home / Self Care | Attending: Certified Nurse Midwife

## 2018-07-27 ENCOUNTER — Ambulatory Visit (INDEPENDENT_AMBULATORY_CARE_PROVIDER_SITE_OTHER): Payer: 59 | Admitting: Certified Nurse Midwife

## 2018-07-27 ENCOUNTER — Inpatient Hospital Stay
Admission: RE | Admit: 2018-07-27 | Discharge: 2018-07-29 | DRG: 817 | Disposition: A | Discharge status: Other Acute Inpatient Hospital | Payer: 59 | Attending: Certified Nurse Midwife | Admitting: Certified Nurse Midwife

## 2018-07-27 ENCOUNTER — Observation Stay: Payer: 59 | Admitting: Certified Registered Nurse Anesthetist

## 2018-07-27 ENCOUNTER — Other Ambulatory Visit: Payer: 59

## 2018-07-27 ENCOUNTER — Other Ambulatory Visit: Payer: Self-pay

## 2018-07-27 ENCOUNTER — Observation Stay: Payer: 59

## 2018-07-27 VITALS — BP 103/57 | HR 144 | Wt 189.5 lb

## 2018-07-27 DIAGNOSIS — Z3A27 27 weeks gestation of pregnancy: Secondary | ICD-10-CM

## 2018-07-27 DIAGNOSIS — N202 Calculus of kidney with calculus of ureter: Secondary | ICD-10-CM | POA: Diagnosis present

## 2018-07-27 DIAGNOSIS — N201 Calculus of ureter: Secondary | ICD-10-CM | POA: Diagnosis not present

## 2018-07-27 DIAGNOSIS — D72829 Elevated white blood cell count, unspecified: Secondary | ICD-10-CM

## 2018-07-27 DIAGNOSIS — A419 Sepsis, unspecified organism: Secondary | ICD-10-CM | POA: Diagnosis present

## 2018-07-27 DIAGNOSIS — N1330 Unspecified hydronephrosis: Secondary | ICD-10-CM

## 2018-07-27 DIAGNOSIS — Z3492 Encounter for supervision of normal pregnancy, unspecified, second trimester: Secondary | ICD-10-CM

## 2018-07-27 DIAGNOSIS — O26832 Pregnancy related renal disease, second trimester: Secondary | ICD-10-CM

## 2018-07-27 DIAGNOSIS — Z87442 Personal history of urinary calculi: Secondary | ICD-10-CM

## 2018-07-27 DIAGNOSIS — N200 Calculus of kidney: Secondary | ICD-10-CM

## 2018-07-27 DIAGNOSIS — R000 Tachycardia, unspecified: Secondary | ICD-10-CM

## 2018-07-27 DIAGNOSIS — O2242 Hemorrhoids in pregnancy, second trimester: Secondary | ICD-10-CM | POA: Diagnosis present

## 2018-07-27 DIAGNOSIS — O9989 Other specified diseases and conditions complicating pregnancy, childbirth and the puerperium: Secondary | ICD-10-CM

## 2018-07-27 HISTORY — PX: CYSTOSCOPY WITH STENT PLACEMENT: SHX5790

## 2018-07-27 LAB — URINALYSIS, COMPLETE (UACMP) WITH MICROSCOPIC
Bilirubin Urine: NEGATIVE
Glucose, UA: NEGATIVE mg/dL
Ketones, ur: 80 mg/dL — AB
Nitrite: NEGATIVE
Protein, ur: 100 mg/dL — AB
Specific Gravity, Urine: 1.026 (ref 1.005–1.030)
WBC, UA: >50 WBC/hpf — ABNORMAL HIGH (ref 0–5)
pH: 5 (ref 5.0–8.0)

## 2018-07-27 LAB — CBC
HCT: 26.3 % — ABNORMAL LOW (ref 36.0–46.0)
Hemoglobin: 8.8 g/dL — ABNORMAL LOW (ref 12.0–15.0)
MCH: 28 pg (ref 26.0–34.0)
MCHC: 33.5 g/dL (ref 30.0–36.0)
MCV: 83.8 fL (ref 80.0–100.0)
Platelets: 199 10*3/uL (ref 150–400)
RBC: 3.14 MIL/uL — ABNORMAL LOW (ref 3.87–5.11)
RDW: 13.6 % (ref 11.5–15.5)
WBC: 13.7 10*3/uL — ABNORMAL HIGH (ref 4.0–10.5)
nRBC: 0 % (ref 0.0–0.2)

## 2018-07-27 LAB — INFLUENZA PANEL BY PCR (TYPE A & B)
Influenza A By PCR: NEGATIVE
Influenza B By PCR: NEGATIVE

## 2018-07-27 LAB — TYPE AND SCREEN
ABO/RH(D): A POS
Antibody Screen: NEGATIVE

## 2018-07-27 SURGERY — CYSTOSCOPY, WITH STENT INSERTION
Anesthesia: General | Laterality: Right

## 2018-07-27 MED ORDER — FENTANYL CITRATE (PF) 100 MCG/2ML IJ SOLN: 25.0000 ug | INTRAMUSCULAR | Status: DC | PRN

## 2018-07-27 MED ORDER — FENTANYL CITRATE (PF) 100 MCG/2ML IJ SOLN
INTRAMUSCULAR | Status: AC
  Filled 2018-07-27: qty 2

## 2018-07-27 MED ORDER — PHENYLEPHRINE HCL 10 MG/ML IJ SOLN
INTRAMUSCULAR | Status: DC | PRN
  Administered 2018-07-27 (×4): 200 ug via INTRAVENOUS

## 2018-07-27 MED ORDER — ACETAMINOPHEN 325 MG RE SUPP
650.0000 mg | RECTAL | Status: DC | PRN
  Administered 2018-07-27: 650 mg via RECTAL
  Filled 2018-07-27: qty 2
  Filled 2018-07-27: qty 1
  Filled 2018-07-27 (×2): qty 2

## 2018-07-27 MED ORDER — LACTATED RINGERS IV SOLN
INTRAVENOUS | Status: DC
  Administered 2018-07-27: 16:00:00 via INTRAVENOUS

## 2018-07-27 MED ORDER — SODIUM CHLORIDE 0.9 % IV SOLN
INTRAVENOUS | Status: DC
  Administered 2018-07-27 – 2018-07-28 (×3): via INTRAVENOUS

## 2018-07-27 MED ORDER — ROCURONIUM BROMIDE 50 MG/5ML IV SOLN
INTRAVENOUS | Status: AC
  Filled 2018-07-27: qty 1

## 2018-07-27 MED ORDER — SODIUM CHLORIDE 0.9 % IV SOLN
1.0000 g | INTRAVENOUS | Status: DC
  Filled 2018-07-27: qty 10

## 2018-07-27 MED ORDER — PROPOFOL 10 MG/ML IV BOLUS
INTRAVENOUS | Status: AC
  Filled 2018-07-27: qty 20

## 2018-07-27 MED ORDER — CALCIUM CARBONATE ANTACID 500 MG PO CHEW
2.0000 | CHEWABLE_TABLET | ORAL | Status: DC | PRN
  Administered 2018-07-28 (×3): 400 mg via ORAL
  Filled 2018-07-27 (×3): qty 2

## 2018-07-27 MED ORDER — FENTANYL CITRATE (PF) 100 MCG/2ML IJ SOLN
INTRAMUSCULAR | Status: DC | PRN
  Administered 2018-07-27 (×2): 50 ug via INTRAVENOUS

## 2018-07-27 MED ORDER — MORPHINE SULFATE (PF) 4 MG/ML IV SOLN
4.0000 mg | INTRAVENOUS | Status: DC | PRN
  Administered 2018-07-27: 4 mg via INTRAVENOUS
  Filled 2018-07-27: qty 1

## 2018-07-27 MED ORDER — ONDANSETRON HCL 4 MG/2ML IJ SOLN
INTRAMUSCULAR | Status: DC | PRN
  Administered 2018-07-27: 4 mg via INTRAVENOUS

## 2018-07-27 MED ORDER — PRENATAL MULTIVITAMIN CH
1.0000 | ORAL_TABLET | Freq: Every day | ORAL | Status: DC
  Administered 2018-07-28: 1 via ORAL
  Filled 2018-07-27: qty 1

## 2018-07-27 MED ORDER — EPHEDRINE SULFATE 50 MG/ML IJ SOLN
INTRAMUSCULAR | Status: DC | PRN
  Administered 2018-07-27: 5 mg via INTRAVENOUS

## 2018-07-27 MED ORDER — LACTATED RINGERS IV SOLN
500.0000 mL | INTRAVENOUS | Status: DC | PRN
  Administered 2018-07-27: 500 mL via INTRAVENOUS
  Administered 2018-07-28 (×2): 1000 mL via INTRAVENOUS

## 2018-07-27 MED ORDER — SODIUM CHLORIDE 0.9 % IV SOLN: 1.0000 g | INTRAVENOUS | Status: DC

## 2018-07-27 MED ORDER — SODIUM CHLORIDE 0.9 % IV SOLN
1.0000 g | INTRAVENOUS | Status: DC
  Administered 2018-07-27 – 2018-07-28 (×2): 1 g via INTRAVENOUS
  Filled 2018-07-27 (×2): qty 1

## 2018-07-27 MED ORDER — SODIUM CHLORIDE 0.9 % IV SOLN
1.0000 g | Freq: Four times a day (QID) | INTRAVENOUS | Status: DC
  Administered 2018-07-27 – 2018-07-28 (×5): 1 g via INTRAVENOUS
  Filled 2018-07-27: qty 1000
  Filled 2018-07-27: qty 1
  Filled 2018-07-27 (×5): qty 1000
  Filled 2018-07-27: qty 1
  Filled 2018-07-27 (×3): qty 1000

## 2018-07-27 MED ORDER — ACETAMINOPHEN 325 MG PO TABS
650.0000 mg | ORAL_TABLET | ORAL | Status: DC | PRN
  Administered 2018-07-28 (×2): 650 mg via ORAL
  Filled 2018-07-27 (×2): qty 2

## 2018-07-27 MED ORDER — OXYBUTYNIN CHLORIDE ER 10 MG PO TB24
10.0000 mg | ORAL_TABLET | Freq: Every day | ORAL | Status: DC | PRN
  Administered 2018-07-28: 10 mg via ORAL
  Filled 2018-07-27 (×2): qty 1

## 2018-07-27 MED ORDER — LIDOCAINE HCL (CARDIAC) PF 100 MG/5ML IV SOSY
PREFILLED_SYRINGE | INTRAVENOUS | Status: DC | PRN
  Administered 2018-07-27: 100 mg via INTRAVENOUS

## 2018-07-27 MED ORDER — ONDANSETRON HCL 4 MG/2ML IJ SOLN: 4.0000 mg | Freq: Once | INTRAMUSCULAR | Status: DC | PRN

## 2018-07-27 MED ORDER — LIDOCAINE HCL (PF) 2 % IJ SOLN
INTRAMUSCULAR | Status: AC
  Filled 2018-07-27: qty 10

## 2018-07-27 MED ORDER — DEXAMETHASONE SODIUM PHOSPHATE 10 MG/ML IJ SOLN
INTRAMUSCULAR | Status: DC | PRN
  Administered 2018-07-27: 5 mg via INTRAVENOUS

## 2018-07-27 MED ORDER — ONDANSETRON HCL 4 MG/2ML IJ SOLN
INTRAMUSCULAR | Status: AC
  Filled 2018-07-27: qty 2

## 2018-07-27 MED ORDER — DOCUSATE SODIUM 100 MG PO CAPS
100.0000 mg | ORAL_CAPSULE | Freq: Every day | ORAL | Status: DC
  Administered 2018-07-28: 100 mg via ORAL
  Filled 2018-07-27: qty 1

## 2018-07-27 MED ORDER — ROCURONIUM BROMIDE 100 MG/10ML IV SOLN
INTRAVENOUS | Status: DC | PRN
  Administered 2018-07-27: 5 mg via INTRAVENOUS

## 2018-07-27 MED ORDER — DEXAMETHASONE SODIUM PHOSPHATE 10 MG/ML IJ SOLN
INTRAMUSCULAR | Status: AC
  Filled 2018-07-27: qty 1

## 2018-07-27 MED ORDER — SUCCINYLCHOLINE CHLORIDE 20 MG/ML IJ SOLN
INTRAMUSCULAR | Status: DC | PRN
  Administered 2018-07-27: 100 mg via INTRAVENOUS

## 2018-07-27 MED ORDER — ZOLPIDEM TARTRATE 5 MG PO TABS: 5.0000 mg | ORAL_TABLET | Freq: Every evening | ORAL | Status: DC | PRN

## 2018-07-27 MED ORDER — PROPOFOL 10 MG/ML IV BOLUS
INTRAVENOUS | Status: DC | PRN
  Administered 2018-07-27: 170 mg via INTRAVENOUS
  Administered 2018-07-27: 30 mg via INTRAVENOUS

## 2018-07-27 MED ORDER — SUCCINYLCHOLINE CHLORIDE 20 MG/ML IJ SOLN
INTRAMUSCULAR | Status: AC
  Filled 2018-07-27: qty 1

## 2018-07-27 SURGICAL SUPPLY — 22 items
BAG DRAIN CYSTO-URO LG1000N (MISCELLANEOUS) ×2 IMPLANT
BAG URINE DRAINAGE (UROLOGICAL SUPPLIES) ×2 IMPLANT
BRUSH SCRUB EZ  4% CHG (MISCELLANEOUS)
BRUSH SCRUB EZ 4% CHG (MISCELLANEOUS) IMPLANT
CATH FOL 2WAY LX 16X30 (CATHETERS) ×2 IMPLANT
CATH URETL 5X70 OPEN END (CATHETERS) ×2 IMPLANT
GLOVE BIOGEL PI IND STRL 7.5 (GLOVE) ×1 IMPLANT
GLOVE BIOGEL PI INDICATOR 7.5 (GLOVE) ×1
GOWN STRL REUS W/ TWL LRG LVL3 (GOWN DISPOSABLE) ×1 IMPLANT
GOWN STRL REUS W/ TWL XL LVL3 (GOWN DISPOSABLE) ×1 IMPLANT
GOWN STRL REUS W/TWL LRG LVL3 (GOWN DISPOSABLE) ×1
GOWN STRL REUS W/TWL XL LVL3 (GOWN DISPOSABLE) ×1
GUIDEWIRE STR DUAL SENSOR (WIRE) ×2 IMPLANT
KIT TURNOVER CYSTO (KITS) ×2 IMPLANT
PACK CYSTO AR (MISCELLANEOUS) ×2 IMPLANT
SET CYSTO W/LG BORE CLAMP LF (SET/KITS/TRAYS/PACK) ×2 IMPLANT
SOL .9 NS 3000ML IRR  AL (IV SOLUTION) ×1
SOL .9 NS 3000ML IRR UROMATIC (IV SOLUTION) ×1 IMPLANT
STENT URET 6FRX26 CONTOUR (STENTS) ×2 IMPLANT
SURGILUBE 2OZ TUBE FLIPTOP (MISCELLANEOUS) ×2 IMPLANT
SYRINGE IRR TOOMEY STRL 70CC (SYRINGE) IMPLANT
WATER STERILE IRR 1000ML POUR (IV SOLUTION) ×2 IMPLANT

## 2018-07-27 NOTE — Anesthesia Postprocedure Evaluation (Signed)
Anesthesia Post Note  Patient: Evelyn Caldwell  Procedure(s) Performed: CYSTOSCOPY WITH STENT PLACEMENT (Right )  Patient location during evaluation: PACU Anesthesia Type: General Level of consciousness: awake and alert Pain management: pain level controlled Vital Signs Assessment: post-procedure vital signs reviewed and stable Respiratory status: spontaneous breathing and respiratory function stable Cardiovascular status: stable Anesthetic complications: no     Last Vitals:  Vitals:   07/27/18 1820 07/27/18 1910  BP: (!) 105/38 (!) 111/45  Pulse:  (!) 114  Resp: 18 18  Temp: 36.9 C 36.7 C  SpO2:      Last Pain:  Vitals:   07/27/18 1910  TempSrc: Oral  PainSc: 0-No pain                 Purl Claytor K

## 2018-07-27 NOTE — Transfer of Care (Signed)
Immediate Anesthesia Transfer of Care Note  Patient: Evelyn Caldwell  Procedure(s) Performed: Procedure(s): CYSTOSCOPY WITH STENT PLACEMENT (Right)  Patient Location: PACU  Anesthesia Type:General  Level of Consciousness: sedated  Airway & Oxygen Therapy: Patient Spontanous Breathing and Patient connected to face mask oxygen  Post-op Assessment: Report given to RN and Post -op Vital signs reviewed and stable  Post vital signs: Reviewed and stable  Last Vitals:  Vitals:   07/27/18 1624 07/27/18 1739  BP:  (!) 101/39  Pulse:  (!) 124  Resp:  (!) 23  Temp: 37.2 C 37.1 C  SpO2:  43%    Complications: No apparent anesthesia complications

## 2018-07-27 NOTE — Anesthesia Procedure Notes (Signed)
Procedure Name: Intubation Date/Time: 07/27/2018 5:02 PM Performed by: Johnna Acosta, CRNA Pre-anesthesia Checklist: Patient identified, Emergency Drugs available, Suction available, Patient being monitored and Timeout performed Patient Re-evaluated:Patient Re-evaluated prior to induction Oxygen Delivery Method: Circle system utilized Preoxygenation: Pre-oxygenation with 100% oxygen Induction Type: IV induction, Rapid sequence and Cricoid Pressure applied Laryngoscope Size: Miller and 2 Grade View: Grade I Tube type: Oral Tube size: 7.0 mm Number of attempts: 1 Airway Equipment and Method: Stylet and Oral airway Placement Confirmation: ETT inserted through vocal cords under direct vision,  positive ETCO2 and breath sounds checked- equal and bilateral Secured at: 21 cm Tube secured with: Tape Dental Injury: Teeth and Oropharynx as per pre-operative assessment  Difficulty Due To: Difficulty was anticipated

## 2018-07-27 NOTE — Anesthesia Preprocedure Evaluation (Signed)
Anesthesia Evaluation  Patient identified by MRN, date of birth, ID band Patient awake    Reviewed: Allergy & Precautions, NPO status , Patient's Chart, lab work & pertinent test results  History of Anesthesia Complications Negative for: history of anesthetic complications  Airway Mallampati: II       Dental   Pulmonary neg sleep apnea, neg COPD,           Cardiovascular (-) hypertension(-) Past MI and (-) CHF (-) dysrhythmias (-) Valvular Problems/Murmurs     Neuro/Psych neg Seizures    GI/Hepatic Neg liver ROS, GERD (with pregnancy)  ,  Endo/Other  neg diabetes  Renal/GU Renal disease (stones)     Musculoskeletal   Abdominal   Peds  Hematology   Anesthesia Other Findings   Reproductive/Obstetrics (+) Pregnancy                             Anesthesia Physical Anesthesia Plan  ASA: II and emergent  Anesthesia Plan: General   Post-op Pain Management:    Induction:   PONV Risk Score and Plan: 3 and Dexamethasone, Ondansetron and Treatment may vary due to age or medical condition  Airway Management Planned: Oral ETT  Additional Equipment:   Intra-op Plan:   Post-operative Plan:   Informed Consent: I have reviewed the patients History and Physical, chart, labs and discussed the procedure including the risks, benefits and alternatives for the proposed anesthesia with the patient or authorized representative who has indicated his/her understanding and acceptance.       Plan Discussed with:   Anesthesia Plan Comments:         Anesthesia Quick Evaluation

## 2018-07-27 NOTE — Patient Instructions (Signed)

## 2018-07-27 NOTE — Consult Note (Addendum)
07/27/2018 4:15 PM   Evelyn Caldwell [facility] Dec 21, 1990 [id number]  CC: Right 1cm distal ureteral stone and fever  HPI: I was asked to see Evelyn Caldwell in consultation from Philip Aspen, CNM for right 1 cm distal ureteral stone and fever to 102.4.  She is a 28 year old female that is [redacted] weeks pregnant who was recently admitted with right flank and pelvic pain and ultimately found to have a 1 cm right distal ureteral stone on CT scan.  She was seen by my partner Dr. [name], and they had elected to discharge her home with oral pain control, and plan for follow-up ureteroscopy next week if she did not pass her stone.  Her urinalysis on 07/25/2018 showed greater than 50 RBCs, greater than 50 WBCs, rare bacteria, and nitrite negative, culture negative thus far.  She re-presented this afternoon after having shaking chills overnight and worsening right-sided flank and groin pain, as well as fever to 102.4.  Urinalysis currently shows many bacteria, greater than 50 WBCs, WBC clumps, nitrite negative.  CBC with leukocytosis to 13.7K.  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   PMH: Past Medical History:  Diagnosis Date  . Kidney stone   . Vulvar lesion     Surgical History: Past Surgical History:  Procedure Laterality Date  . none      Allergies: No Known Allergies  Family History: Family History  Problem Relation Age of Onset  . Congestive Heart Failure Maternal Grandfather   . Parkinson's disease Paternal Grandmother   . Kidney failure Cousin   . Kidney cancer Neg Hx   . Prostate cancer Neg Hx     Social History:  reports that she has never smoked. She has never used smokeless tobacco. She reports that she does not drink alcohol or use drugs.  ROS: Negative aside from those stated in the HPI  Physical Exam: BP (!) 124/40   Pulse (!) 138   Temp (!) 102.4 F (39.1 C) (Oral)   Resp 18   Ht 5\' 7"  (1.702 m)   Wt 85.7 kg   LMP  01/09/2018   SpO2 96%   BMI 29.60 kg/m    Constitutional:  Alert and oriented, No acute distress. Cardiovascular: Tachycardic, regular rhythm Respiratory: Clear to auscultation bilaterally GI: Gravid abdomen GU: Right CVA tenderness Lymph: No cervical or inguinal lymphadenopathy. Skin: No rashes, bruises or suspicious lesions. Neurologic: Grossly intact, no focal deficits, moving all 4 extremities. Psychiatric: Normal mood and affect.  Laboratory Data: Reviewed WBC 13.7  Urinalysis today many bacteria, greater than 50 WBCs, WBC clumps, nitrite negative  Pertinent Imaging: I have personally reviewed the CT stone protocol dated 07/26/2018.  1 cm right distal ureteral stone with upstream hydronephrosis.  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  She is febrile to 102.4 with tachycardia and urinalysis concerning for infection with greater than 50 WBCs and WBC clumps with many bacteria.  I had a very long conversation with the patient and her partner about the options moving forward.  I discussed the need for emergent drainage in the setting of an infected obstructed system.  We discussed options for management would be cystoscopy with right ureteral stent placement and Foley catheter placement, or  right nephrostomy tube placement with interventional radiology.  We discussed the risks and benefits of these at length.  I reiterated that we cannot definitively treat a stone in the setting of infection, and today's procedures is intended to provide drainage in the setting of serious infection.  We also discussed the risks of anesthesia, radiation, and surgery in the setting of pregnancy, and subsequent risks of the fetus.  We discussed stent related symptoms at  length including dysuria, urgency, frequency, and flank pain. We discussed risks of bleeding, infection, possibility of inability to place stent requiring nephrostomy tube, and need for additional procedures.  We also discussed the risks of no surgical intervention which could include worsening sepsis, premature labor, fetal injury or demise.  They understand these risks and would like to proceed with cystoscopy and right ureteral stent placement, with plan for likely delayed right ureteroscopy/laser lithotripsy/stent exchange after infection treated in 10 to 14 days. I discussed her case with the OB and anesthesia teams in preparation of surgery.  -Recommend broad-spectrum antibiotics, ampicillin in the setting of prior sensitive enterococcus infections  -OR tonight for right ureteral stent placement  Billey Co, MD  [facility] [address], [place], [place] [address] [phone]

## 2018-07-27 NOTE — Op Note (Addendum)
Date of procedure: 07/27/18  Preoperative diagnosis:  1. Right 1 cm distal ureteral stone, sepsis from urinary source  Postoperative diagnosis:  1. Same  Procedure: 1. Cystoscopy, right ureteral stent placement  Surgeon: Nickolas Madrid, MD  Anesthesia: General  Complications: None  Intraoperative findings:  1.  Normal cystoscopy 2.  Right 6 F x 26 cm ureteral stent placement with purulent drainage from right collecting system  EBL: None  Specimens: Right renal aspirate for Gram stain and culture  Drains: Right 6 French by 26 cm ureteral stent, 16 French Foley  Indication: Evelyn Caldwell is a 28 y.o. female at [redacted] weeks gestation who presents with ongoing severe right flank pain, fever to 102.4, urinalysis concerning for infection, and recent CT scan demonstrating a 1 cm right distal ureteral stone.   We discussed at length the risks and benefits of the procedure, and risks to both the patient and the fetus.  After reviewing the management options for treatment, they elected to proceed with the above surgical procedure(s). We have discussed the potential benefits and risks of the procedure, side effects of the proposed treatment, the likelihood of the patient achieving the goals of the procedure, and any potential problems that might occur during the procedure or recuperation. Informed consent has been obtained.  Description of procedure:  The patient was taken to the operating room and general anesthesia was induced. The patient was placed in the dorsal lithotomy position, and lead shielding was placed over the fetus both above and below the patient. She was prepped and draped in the usual sterile fashion, and preoperative antibiotics(ampicillin and ceftriaxone) were administered. A preoperative time-out was performed.  An effort was made during the entire case to minimize fluoroscopy in the setting of the gravid patient, and pediatric settings were used.  A 21 French  rigid cystoscope with a 30 degree lens was used to intubate the urethra.  The ureteral orifices were posterior bilaterally.  I attempted to cannulate the right ureteral orifice with a sensor wire however met immediate resistance and the wire buckled.  I advanced a 5 [place] access catheter and was ultimately able to bypass the distal ureteral stone with a sensor wire.  Minimal fluoroscopy was used.  Spot fluoroscopy was used to confirm wire placement in the collecting system.  The 5 French access catheter was advanced over the wire into the collecting system and the wire removed.  There was a hydronephrotic drip and immediate drainage of purulent urine noted.  Approximately 10 cc of purulent urine was aspirated and this was sent for Gram stain and culture.  5 cc of contrast was injected into the collecting system to aid in stent placement and demonstrated a dilated collecting system.  A 6 French by 26 cm ureteral stent was then advanced over the wire, and fluoroscopy used to confirm positioning.  There was a slight shepherd's hook in the renal pelvis, however there was brisk purulent drainage through the side ports and end of the stent in the bladder, and I elected not to upsize the stent in order to minimize any additional radiation exposure.  The bladder was drained, and again the stent was examined and brisk purulent drainage was noted.  A 16 French Foley was placed to maximize decompression.  Again, fluoroscopy was minimized during the entire case to avoid any excess exposure to the fetus.  Fetal heart monitoring was reassuring both pre-op and in PACU performed by the [facility] nursing team.  Disposition: Stable to PACU  Plan:  Continue ampicillin and ceftriaxone, follow-up Gram stain and culture Maintain Foley until afebrile 24 hours Discuss stent exchange versus definitive ureteroscopy in the future for her right ureteral stone after at least 14 days of culture appropriate antibiotics  Nickolas Madrid,  MD

## 2018-07-27 NOTE — Progress Notes (Signed)
Pt to OR via hospital bed by orderly

## 2018-07-27 NOTE — Anesthesia Post-op Follow-up Note (Signed)
Anesthesia QCDR form completed.        

## 2018-07-27 NOTE — Progress Notes (Addendum)
ANTEPARTUM COMPREHENSIVE PROGRESS NOTE  Evelyn Caldwell is a 28 y.o. G2P1001 at [redacted]w[redacted]d who is admitted for kidney stone.  Estimated Date of Delivery: 10/20/18 Status post cystoscopy, right ureteral stent placement by Dr. Diamantina Providence.   Length of Stay:  0 Days. Admitted 07/27/2018  Subjective: Doing well , denies pain at this time.  Patient reports good fetal movement.  She reports no uterine contractions, no bleeding and no loss of fluid per vagina.  Vitals:  Blood pressure (!) 111/45, pulse (!) 114, temperature 98 F (36.7 C), temperature source Oral, resp. rate 18, height 5\' 7"  (1.702 m), weight 85.7 kg, last menstrual period 01/09/2018, SpO2 96 %, currently breastfeeding. Physical Examination:  Fetal monitoring: NST reactive category 1 strip FHR: 150 bpm, Variability: moderate, Accelerations: Present, Decelerations: Absent  Uterine activity: irritability , pt denies feeling any contractions  Results for orders placed or performed during the hospital encounter of 07/27/18 (from the past 48 hour(s))  CBC     Status: Abnormal   Collection Time: 07/27/18 11:12 AM  Result Value Ref Range   WBC 13.7 (H) 4.0 - 10.5 K/uL   RBC 3.14 (L) 3.87 - 5.11 MIL/uL   Hemoglobin 8.8 (L) 12.0 - 15.0 g/dL   HCT 26.3 (L) 36.0 - 46.0 %   MCV 83.8 80.0 - 100.0 fL   MCH 28.0 26.0 - 34.0 pg   MCHC 33.5 30.0 - 36.0 g/dL   RDW 13.6 11.5 - 15.5 %   Platelets 199 150 - 400 K/uL   nRBC 0.0 0.0 - 0.2 %    Comment: Performed at [facility], [address] [place], [place] [address]  Type and screen [place]     Status: None   Collection Time: 07/27/18  2:46 PM  Result Value Ref Range   ABO/RH(D) A POS    Antibody Screen NEG    Sample Expiration      07/30/2018 Performed at [facility], [address], [place], [place] [address]   Influenza panel by PCR (type A & B)     Status: None   Collection Time: 07/27/18  3:11 PM  Result Value Ref Range   Influenza  A By PCR NEGATIVE NEGATIVE   Influenza B By PCR NEGATIVE NEGATIVE    Comment: (NOTE) The Xpert Xpress Flu assay is intended as an aid in the diagnosis of  influenza and should not be used as a sole basis for treatment.  This  assay is FDA approved for nasopharyngeal swab specimens only. Nasal  washings and aspirates are unacceptable for Xpert Xpress Flu testing. Performed at [facility], [address] [place], [place] [address]   Urinalysis, Complete w Microscopic     Status: Abnormal   Collection Time: 07/27/18  3:11 PM  Result Value Ref Range   Color, Urine AMBER (A) YELLOW   APPearance TURBID (A) CLEAR   Specific Gravity, Urine 1.026 1.005 - 1.030   pH 5.0 5.0 - 8.0   Glucose, UA NEGATIVE NEGATIVE mg/dL   Hgb urine dipstick SMALL (A) NEGATIVE   Bilirubin Urine NEGATIVE NEGATIVE   Ketones, ur 80 (A) NEGATIVE mg/dL   Protein, ur 100 (A) NEGATIVE mg/dL   Nitrite NEGATIVE NEGATIVE   Leukocytes,Ua MODERATE (A) NEGATIVE   RBC / HPF 21-50 0 - 5 RBC/hpf   WBC, UA >50 (H) 0 - 5 WBC/hpf   Bacteria, UA MANY (A) NONE SEEN   Squamous Epithelial / LPF 6-10 0 - 5  WBC Clumps PRESENT    Mucus PRESENT     Comment: Performed at [facility], [address] [place], [place] [address]    US Renal  Result Date: 07/27/2018 CLINICAL DATA:  Kidney stone complicating pregnancy. EXAM: RENAL / URINARY TRACT ULTRASOUND COMPLETE COMPARISON:  CT abdomen pelvis July 26, 2018 FINDINGS: Right Kidney: Renal measurements: 13.6 x 6.7 x 7 cm = volume: 331 mL. Moderate right hydronephrosis is identified. Right renal calculi are noted. Left Kidney: Renal measurements: 12.6 x 6.7 x 5.8 cm = volume: 254 mL. Echogenicity within normal limits. No mass or hydronephrosis visualized. Bladder: Appears normal for degree of bladder distention. IMPRESSION: Moderate right hydronephrosis.  Right renal calculi are noted. Electronically Signed   By: Abelardo Diesel M.D.   On: 07/27/2018 12:28   US  Renal  Result Date: 07/26/2018 CLINICAL DATA:  28 year old female with right flank pain. EXAM: RENAL / URINARY TRACT ULTRASOUND COMPLETE COMPARISON:  Renal ultrasound dated 02/23/2018 FINDINGS: Right Kidney: Renal measurements: 13.2 x 5.9 x 5.7 cm = volume: 233 mL. There is mild increased echogenicity. There are 2 stones noted in the region of the ureteropelvic junction with the larger measuring approximately 9 mm. There is a mild-to-moderate right hydronephrosis. Left Kidney: Renal measurements: 12.7 x 5.8 x 5.4 cm = volume: 207 mL. Normal echogenicity. No hydronephrosis or shadowing stone. Bladder: The urinary bladder is not well visualized. IMPRESSION: Mild-to-moderate right hydronephrosis, likely secondary to right UPJ calculi. Electronically Signed   By: Anner Crete M.D.   On: 07/26/2018 00:36   Ct Renal Stone Study  Result Date: 07/26/2018 CLINICAL DATA:  Right flank pain since January with gross hematuria. EXAM: CT ABDOMEN AND PELVIS WITHOUT CONTRAST TECHNIQUE: Multidetector CT imaging of the abdomen and pelvis was performed following the standard protocol without IV contrast. COMPARISON:  06/03/2014 FINDINGS: Lower chest: No acute abnormality. Hepatobiliary: No focal liver abnormality is seen. No gallstones, gallbladder wall thickening, or biliary dilatation. Pancreas: Unremarkable. No pancreatic ductal dilatation or surrounding inflammatory changes. Spleen: Normal in size without focal abnormality. Adrenals/Urinary Tract: Normal appearance of the adrenal glands. Bilateral nephrolithiasis identified. There is right-sided nephromegaly, perinephric fat stranding and hydronephrosis with hydroureter. Within the distal right ureter there is a stone which measures 6 x 7 by 10 mm. This is just proximal to the right UVJ. Urinary bladder appears normal. No left hydronephrosis or hydroureter. Stomach/Bowel: Stomach normal. No abnormal small or large bowel dilatation identified. Vascular/Lymphatic: Normal  appearance of the abdominal aorta. No adenopathy identified. No pelvic or inguinal adenopathy. Reproductive: Gravid uterus compatible with [redacted] week gestation is identified. Other: No free fluid or fluid collections. Musculoskeletal: No acute or significant osseous findings. IMPRESSION: 1. Large distal right ureteral calculus measures 6 x 7 x 10 mm. This results in right-sided hydronephrosis and hydroureter. 2. Bilateral nephrolithiasis. 3. Gravid uterus compatible with a [redacted] week gestation. Electronically Signed   By: Kerby Moors M.D.   On: 07/26/2018 14:38    Current scheduled medications . docusate sodium  100 mg Oral Daily  . prenatal multivitamin  1 tablet Oral Q1200    I have reviewed the patient's current medications.  ASSESSMENT: Active Problems:   Kidney stone complicating pregnancy, second trimester   Labor and delivery, indication for care   PLAN: Continue ampicillin and ceftriaxone, follow-up Gram stain and culture. Maintain Foley until afebrile 24 hours NST q shift Continue routine antenatal care.

## 2018-07-27 NOTE — H&P (View-Only) (Signed)
07/27/2018 4:15 PM   Evelyn Caldwell [facility] 09/15/1990 [id number]  CC: Right 1cm distal ureteral stone and fever  HPI: I was asked to see [name] in consultation from Philip Aspen, CNM for right 1 cm distal ureteral stone and fever to 102.4.  She is a 28 year old female that is [redacted] weeks pregnant who was recently admitted with right flank and pelvic pain and ultimately found to have a 1 cm right distal ureteral stone on CT scan.  She was seen by my partner Dr. [name], and they had elected to discharge her home with oral pain control, and plan for follow-up ureteroscopy next week if she did not pass her stone.  Her urinalysis on 07/25/2018 showed greater than 50 RBCs, greater than 50 WBCs, rare bacteria, and nitrite negative, culture negative thus far.  She re-presented this afternoon after having shaking chills overnight and worsening right-sided flank and groin pain, as well as fever to 102.4.  Urinalysis currently shows many bacteria, greater than 50 WBCs, WBC clumps, nitrite negative.  CBC with leukocytosis to 13.7K.  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   PMH: Past Medical History:  Diagnosis Date  . Kidney stone   . Vulvar lesion     Surgical History: Past Surgical History:  Procedure Laterality Date  . none      Allergies: No Known Allergies  Family History: Family History  Problem Relation Age of Onset  . Congestive Heart Failure Maternal Grandfather   . Parkinson's disease Paternal Grandmother   . Kidney failure Cousin   . Kidney cancer Neg Hx   . Prostate cancer Neg Hx     Social History:  reports that she has never smoked. She has never used smokeless tobacco. She reports that she does not drink alcohol or use drugs.  ROS: Negative aside from those stated in the HPI  Physical Exam: BP (!) 124/40   Pulse (!) 138   Temp (!) 102.4 F (39.1 C) (Oral)   Resp 18   Ht 5\' 7"  (1.702 m)   Wt 85.7 kg   LMP  01/09/2018   SpO2 96%   BMI 29.60 kg/m    Constitutional:  Alert and oriented, No acute distress. Cardiovascular: Tachycardic, regular rhythm Respiratory: Clear to auscultation bilaterally GI: Gravid abdomen GU: Right CVA tenderness Lymph: No cervical or inguinal lymphadenopathy. Skin: No rashes, bruises or suspicious lesions. Neurologic: Grossly intact, no focal deficits, moving all 4 extremities. Psychiatric: Normal mood and affect.  Laboratory Data: Reviewed WBC 13.7  Urinalysis today many bacteria, greater than 50 WBCs, WBC clumps, nitrite negative  Pertinent Imaging: I have personally reviewed the CT stone protocol dated 07/26/2018.  1 cm right distal ureteral stone with upstream hydronephrosis.  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  She is febrile to 102.4 with tachycardia and urinalysis concerning for infection with greater than 50 WBCs and WBC clumps with many bacteria.  I had a very long conversation with the patient and her partner about the options moving forward.  I discussed the need for emergent drainage in the setting of an infected obstructed system.  We discussed options for management would be cystoscopy with right ureteral stent placement and Foley catheter placement, or  right nephrostomy tube placement with interventional radiology.  We discussed the risks and benefits of these at length.  I reiterated that we cannot definitively treat a stone in the setting of infection, and today's procedures is intended to provide drainage in the setting of serious infection.  We also discussed the risks of anesthesia, radiation, and surgery in the setting of pregnancy, and subsequent risks of the fetus.  We discussed stent related symptoms at  length including dysuria, urgency, frequency, and flank pain. We discussed risks of bleeding, infection, possibility of inability to place stent requiring nephrostomy tube, and need for additional procedures.  We also discussed the risks of no surgical intervention which could include worsening sepsis, premature labor, fetal injury or demise.  They understand these risks and would like to proceed with cystoscopy and right ureteral stent placement, with plan for likely delayed right ureteroscopy/laser lithotripsy/stent exchange after infection treated in 10 to 14 days. I discussed her case with the OB and anesthesia teams in preparation of surgery.  -Recommend broad-spectrum antibiotics, ampicillin in the setting of prior sensitive enterococcus infections  -OR tonight for right ureteral stent placement  Billey Co, MD  [facility] [address], [place], [place] [address] [phone]

## 2018-07-27 NOTE — Progress Notes (Signed)
Subjective:   Evelyn Caldwell is a 28 y.o. G2P1001 [redacted]w[redacted]d being seen today for her obstetrical visit.  Patient reports generalized malaise, "shakiness", pale skin, and increased flank pain with movement.    Denies contractions, vaginal bleeding or leaking of fluid.  Reports good fetal movement.  Notes she has been able to urinate small amounts after drinking large amounts of fluid.   The following portions of the patient's history were reviewed and updated as appropriate: allergies, current medications, past family history, past medical history, past social history, past surgical history and problem list.   Objective:   BP (!) 103/57   Pulse (!) 144   Wt 189 lb 8 oz (86 kg)   LMP 01/09/2018   BMI 29.68 kg/m   Temperature: 99.6 F oral  FHT: Fetal Heart Rate (bpm): 176  Uterine Size: Fundal Height: 27 cm  Fetal Movement: Movement: Present    Abdomen:  soft, gravid, appropriate for gestational age,non-tender   Assessment:   Pregnancy:  G2P1001 at [redacted]w[redacted]d  1. Second trimester pregnancy  - POC Urinalysis Dipstick OB  2. Kidney stone complicating pregnancy, second trimester   3. Fetal tachycardia affecting care of mother, delivered   4. Tachycardia   Plan:   Limited assessment.   Patient to birthing suites for fetal monitoring and further evaluation.   Reviewed red flag symptoms and when to call.   RTC x 1 week for 28 week labs and ROB or sooner if needed.   Report called to Claiborne Billings, [employer] RN, and Philip Aspen, on call CNM.    Diona Fanti, CNM [facility], [facility] 07/27/18 9:16 AM

## 2018-07-27 NOTE — H&P (Addendum)
ANTEPARTUM HISTORY AND PHYSICAL NOTE   History of Present Illness: Evelyn Caldwell is a 28 y.o. G2P1001 at [redacted]w[redacted]d admitted for kidney stones Patient reports the fetal movement as active. Patient reports uterine contraction  activity as none. Patient reports  vaginal bleeding as none. Patient describes fluid per vagina as None. Fetal presentation is unsure.  Patient Active Problem List   Diagnosis Date Noted  . Labor and delivery, indication for care 07/27/2018  . Pregnancy complicated by nephrolithiasis, antepartum 07/26/2018  . Pregnancy 07/25/2018  . Internal hemorrhoids 08/18/2016  . Status post vacuum-assisted vaginal delivery 03/09/2016  . Hydronephrosis 01/26/2016  . Nephrolithiasis 10/22/2015  . Headache, migraine 01/24/2015    Past Medical History:  Diagnosis Date  . Kidney stone   . Vulvar lesion     Past Surgical History:  Procedure Laterality Date  . none      OB History  Gravida Para Term Preterm AB Living  2 1 1     1   SAB TAB Ectopic Multiple Live Births          1    # Outcome Date GA Lbr Len/2nd Weight Sex Delivery Anes PTL Lv  2 Current           1 Term 03/09/16 [redacted]w[redacted]d  3674 g F Vag-Spont   LIV    Social History   Socioeconomic History  . Marital status: Married    Spouse name: Not on file  . Number of children: Not on file  . Years of education: Not on file  . Highest education level: Not on file  Occupational History  . Not on file  Social Needs  . Financial resource strain: Not on file  . Food insecurity:    Worry: Not on file    Inability: Not on file  . Transportation needs:    Medical: Not on file    Non-medical: Not on file  Tobacco Use  . Smoking status: Never Smoker  . Smokeless tobacco: Never Used  Substance and Sexual Activity  . Alcohol use: No    Comment: occasionally  . Drug use: No  . Sexual activity: Yes    Birth control/protection: None  Lifestyle  . Physical activity:    Days per week: Not on file     Minutes per session: Not on file  . Stress: Not on file  Relationships  . Social connections:    Talks on phone: Not on file    Gets together: Not on file    Attends religious service: Not on file    Active member of club or organization: Not on file    Attends meetings of clubs or organizations: Not on file    Relationship status: Not on file  Other Topics Concern  . Not on file  Social History Narrative  . Not on file    Family History  Problem Relation Age of Onset  . Congestive Heart Failure Maternal Grandfather   . Parkinson's disease Paternal Grandmother   . Kidney failure Cousin   . Kidney cancer Neg Hx   . Prostate cancer Neg Hx     No Known Allergies  Medications Prior to Admission  Medication Sig Dispense Refill Last Dose  . acetaminophen (TYLENOL) 500 MG tablet Take 1,000 mg by mouth every 6 (six) hours as needed.   Taking  . Butalbital-APAP-Caffeine 50-325-40 MG capsule Take 1-2 capsules by mouth every 6 (six) hours as needed for headache. 30 capsule 0 Taking  . magnesium oxide (MAG-OX)  400 (241.3 Mg) MG tablet Take 1 tablet (400 mg total) by mouth 2 (two) times daily as needed. 60 tablet 2 Taking  . oxyCODONE-acetaminophen (PERCOCET/ROXICET) 5-325 MG tablet Take 1-2 tablets by mouth every 6 (six) hours as needed for moderate pain or severe pain. 30 tablet 0 Taking  . Prenatal Vit-Fe Fumarate-FA (PRENATAL MULTIVITAMIN) TABS tablet Take 1 tablet by mouth daily at 12 noon.   Taking  . tamsulosin (FLOMAX) 0.4 MG CAPS capsule Take 1 capsule (0.4 mg total) by mouth daily. 30 capsule 0 Taking  . terconazole (TERAZOL 7) 0.4 % vaginal cream Place 1 applicator vaginally at bedtime. (Patient not taking: Reported on 07/27/2018) 45 g 0 Not Taking    Review of Systems - General ROS: positive for  - chills, fatigue and malaise Respiratory ROS: no cough, shortness of breath, or wheezing clear bilaterally  Cardiovascular ROS: no chest pain or dyspnea on exertion Gastrointestinal  ROS: no abdominal pain, change in bowel habits, or black or bloody stools Genito-Urinary ROS: positive for - change in urinary stream Musculoskeletal ROS: positive for - muscular weakness Neurological ROS: negative kidney stone  Vitals:  BP (!) 113/58 (BP Location: Left Arm)   Pulse (!) 125   Temp 99.5 F (37.5 C) (Oral)   Resp 18   Ht 5\' 7"  (1.702 m)   Wt 85.7 kg   LMP 01/09/2018   SpO2 96%   BMI 29.60 kg/m  Physical Examination: CONSTITUTIONAL: Well-developed, well-nourished female in mild distress.  HENT:  Normocephalic, atraumatic, External right and left ear normal. Oropharynx is clear and moist EYES: Conjunctivae and EOM are normal. Pupils are equal, round, and reactive to light. No scleral icterus.  SKIN: Skin is warm and dry and flused. No rash noted. Not diaphoretic. No erythema. No pallor. [place]: Alert and oriented to person, place, and time. Normal reflexes, muscle tone coordination. No cranial nerve deficit noted. PSYCHIATRIC: Normal mood and affect. Normal behavior. Normal judgment and thought content. CARDIOVASCULAR: Normal heart rate noted, regular rhythm RESPIRATORY: Effort and breath sounds normal, no problems with respiration noted ABDOMEN: Soft, nontender, nondistended, gravid. MUSCULOSKELETAL: Normal range of motion. No edema and no tenderness. 2+ distal pulses.  Cervix: deferred Membranes:intact Fetal Monitoring:Baseline: 155 bpm, Variability: Good {> 6 bpm), Accelerations: Reactive and Decelerations: Absent Tocometer: no contractions present   Labs:  No results found for this or any previous visit (from the past 24 hour(s)).  Imaging Studies: US Renal  Result Date: 07/26/2018 CLINICAL DATA:  28 year old female with right flank pain. EXAM: RENAL / URINARY TRACT ULTRASOUND COMPLETE COMPARISON:  Renal ultrasound dated 02/23/2018 FINDINGS: Right Kidney: Renal measurements: 13.2 x 5.9 x 5.7 cm = volume: 233 mL. There is mild increased echogenicity. There  are 2 stones noted in the region of the ureteropelvic junction with the larger measuring approximately 9 mm. There is a mild-to-moderate right hydronephrosis. Left Kidney: Renal measurements: 12.7 x 5.8 x 5.4 cm = volume: 207 mL. Normal echogenicity. No hydronephrosis or shadowing stone. Bladder: The urinary bladder is not well visualized. IMPRESSION: Mild-to-moderate right hydronephrosis, likely secondary to right UPJ calculi. Electronically Signed   By: Anner Crete M.D.   On: 07/26/2018 00:36   Ct Renal Stone Study  Result Date: 07/26/2018 CLINICAL DATA:  Right flank pain since January with gross hematuria. EXAM: CT ABDOMEN AND PELVIS WITHOUT CONTRAST TECHNIQUE: Multidetector CT imaging of the abdomen and pelvis was performed following the standard protocol without IV contrast. COMPARISON:  06/03/2014 FINDINGS: Lower chest: No acute abnormality. Hepatobiliary: No  focal liver abnormality is seen. No gallstones, gallbladder wall thickening, or biliary dilatation. Pancreas: Unremarkable. No pancreatic ductal dilatation or surrounding inflammatory changes. Spleen: Normal in size without focal abnormality. Adrenals/Urinary Tract: Normal appearance of the adrenal glands. Bilateral nephrolithiasis identified. There is right-sided nephromegaly, perinephric fat stranding and hydronephrosis with hydroureter. Within the distal right ureter there is a stone which measures 6 x 7 by 10 mm. This is just proximal to the right UVJ. Urinary bladder appears normal. No left hydronephrosis or hydroureter. Stomach/Bowel: Stomach normal. No abnormal small or large bowel dilatation identified. Vascular/Lymphatic: Normal appearance of the abdominal aorta. No adenopathy identified. No pelvic or inguinal adenopathy. Reproductive: Gravid uterus compatible with [redacted] week gestation is identified. Other: No free fluid or fluid collections. Musculoskeletal: No acute or significant osseous findings. IMPRESSION: 1. Large distal right  ureteral calculus measures 6 x 7 x 10 mm. This results in right-sided hydronephrosis and hydroureter. 2. Bilateral nephrolithiasis. 3. Gravid uterus compatible with a [redacted] week gestation. Electronically Signed   By: Kerby Moors M.D.   On: 07/26/2018 14:38     Assessment and Plan: Patient Active Problem List   Diagnosis Date Noted  . Labor and delivery, indication for care 07/27/2018  . Pregnancy complicated by nephrolithiasis, antepartum 07/26/2018  . Pregnancy 07/25/2018  . Internal hemorrhoids 08/18/2016  . Status post vacuum-assisted vaginal delivery 03/09/2016  . Hydronephrosis 01/26/2016  . Nephrolithiasis 10/22/2015  . Headache, migraine 01/24/2015   Placed in observation to Antenatal CBC, renal u/s, and IV pain medication as needed. Will consult urology once u/s complete  Routine antenatal care Dr. Marcelline Mates consulted on plan of care  Philip Aspen, CNM

## 2018-07-27 NOTE — OB Triage Note (Signed)
Evelyn Caldwell sent from [facility] with continued right flank pain. In addition, she now has maternal tachycardia, fetal tachycardia and a temp of 99.5. Pt reports her pain 9/10.

## 2018-07-27 NOTE — Progress Notes (Addendum)
ANTEPARTUM COMPREHENSIVE PROGRESS NOTE  Evelyn Caldwell is a 28 y.o. G2P1001 at [redacted]w[redacted]d who is admitted for kidney stones.  Estimated Date of Delivery: 10/20/18   Length of Stay:  0 Days. Admitted 07/27/2018  Subjective: Feeling more comfortable with pain medications.  Patient reports good fetal movement.  She reports no uterine contractions, no bleeding and no loss of fluid per vagina.  Vitals:  Blood pressure (!) 113/58, pulse (!) 125, temperature 99.5 F (37.5 C), temperature source Oral, resp. rate 18, height 5\' 7"  (1.702 m), weight 85.7 kg, last menstrual period 01/09/2018, SpO2 96 %, currently breastfeeding.  Fetal monitoring: NST Q shift, reactive Uterine activity: irritability   Results for orders placed or performed during the hospital encounter of 07/27/18 (from the past 48 hour(s))  CBC     Status: Abnormal   Collection Time: 07/27/18 11:12 AM  Result Value Ref Range   WBC 13.7 (H) 4.0 - 10.5 K/uL   RBC 3.14 (L) 3.87 - 5.11 MIL/uL   Hemoglobin 8.8 (L) 12.0 - 15.0 g/dL   HCT 26.3 (L) 36.0 - 46.0 %   MCV 83.8 80.0 - 100.0 fL   MCH 28.0 26.0 - 34.0 pg   MCHC 33.5 30.0 - 36.0 g/dL   RDW 13.6 11.5 - 15.5 %   Platelets 199 150 - 400 K/uL   nRBC 0.0 0.0 - 0.2 %    Comment: Performed at [facility], [address] [place], [place] [address]    US Renal  Result Date: 07/27/2018 CLINICAL DATA:  Kidney stone complicating pregnancy. EXAM: RENAL / URINARY TRACT ULTRASOUND COMPLETE COMPARISON:  CT abdomen pelvis July 26, 2018 FINDINGS: Right Kidney: Renal measurements: 13.6 x 6.7 x 7 cm = volume: 331 mL. Moderate right hydronephrosis is identified. Right renal calculi are noted. Left Kidney: Renal measurements: 12.6 x 6.7 x 5.8 cm = volume: 254 mL. Echogenicity within normal limits. No mass or hydronephrosis visualized. Bladder: Appears normal for degree of bladder distention. IMPRESSION: Moderate right hydronephrosis.  Right renal calculi are noted.  Electronically Signed   By: Abelardo Diesel M.D.   On: 07/27/2018 12:28   US Renal  Result Date: 07/26/2018 CLINICAL DATA:  28 year old female with right flank pain. EXAM: RENAL / URINARY TRACT ULTRASOUND COMPLETE COMPARISON:  Renal ultrasound dated 02/23/2018 FINDINGS: Right Kidney: Renal measurements: 13.2 x 5.9 x 5.7 cm = volume: 233 mL. There is mild increased echogenicity. There are 2 stones noted in the region of the ureteropelvic junction with the larger measuring approximately 9 mm. There is a mild-to-moderate right hydronephrosis. Left Kidney: Renal measurements: 12.7 x 5.8 x 5.4 cm = volume: 207 mL. Normal echogenicity. No hydronephrosis or shadowing stone. Bladder: The urinary bladder is not well visualized. IMPRESSION: Mild-to-moderate right hydronephrosis, likely secondary to right UPJ calculi. Electronically Signed   By: Anner Crete M.D.   On: 07/26/2018 00:36   Ct Renal Stone Study  Result Date: 07/26/2018 CLINICAL DATA:  Right flank pain since January with gross hematuria. EXAM: CT ABDOMEN AND PELVIS WITHOUT CONTRAST TECHNIQUE: Multidetector CT imaging of the abdomen and pelvis was performed following the standard protocol without IV contrast. COMPARISON:  06/03/2014 FINDINGS: Lower chest: No acute abnormality. Hepatobiliary: No focal liver abnormality is seen. No gallstones, gallbladder wall thickening, or biliary dilatation. Pancreas: Unremarkable. No pancreatic ductal dilatation or surrounding inflammatory changes. Spleen: Normal in size without focal abnormality. Adrenals/Urinary Tract: Normal appearance of the adrenal glands. Bilateral nephrolithiasis identified. There is right-sided nephromegaly, perinephric fat stranding and hydronephrosis  with hydroureter. Within the distal right ureter there is a stone which measures 6 x 7 by 10 mm. This is just proximal to the right UVJ. Urinary bladder appears normal. No left hydronephrosis or hydroureter. Stomach/Bowel: Stomach normal. No  abnormal small or large bowel dilatation identified. Vascular/Lymphatic: Normal appearance of the abdominal aorta. No adenopathy identified. No pelvic or inguinal adenopathy. Reproductive: Gravid uterus compatible with [redacted] week gestation is identified. Other: No free fluid or fluid collections. Musculoskeletal: No acute or significant osseous findings. IMPRESSION: 1. Large distal right ureteral calculus measures 6 x 7 x 10 mm. This results in right-sided hydronephrosis and hydroureter. 2. Bilateral nephrolithiasis. 3. Gravid uterus compatible with a [redacted] week gestation. Electronically Signed   By: Kerby Moors M.D.   On: 07/26/2018 14:38    Current scheduled medications   I have reviewed the patient's current medications.  ASSESSMENT: Active Problems:   Kidney stone complicating pregnancy, second trimester   Labor and delivery, indication for care   PLAN: Discussed u/s result, fetal heart tracing, and lab results. Discussed with pt option to continue plan as previously discussed with urology I.e. go home with pain medications and flomax. She states she is to uncomfortable and would like to move forward with surgical options to treat the kidney stones. Urology paged and informed of pt request. They will see her later this evening and plan for procedure tomorrow.   Philip Aspen, CNM    Continue routine antenatal care.   Verita Schneiders, MD, [place] [facility] [employer] [facility] [place]

## 2018-07-28 ENCOUNTER — Telehealth: Payer: Self-pay | Admitting: Obstetrics and Gynecology

## 2018-07-28 ENCOUNTER — Other Ambulatory Visit: Payer: Self-pay | Admitting: Certified Nurse Midwife

## 2018-07-28 ENCOUNTER — Inpatient Hospital Stay: Payer: 59

## 2018-07-28 ENCOUNTER — Encounter: Payer: Self-pay | Admitting: Urology

## 2018-07-28 DIAGNOSIS — O26832 Pregnancy related renal disease, second trimester: Secondary | ICD-10-CM | POA: Diagnosis present

## 2018-07-28 DIAGNOSIS — Z3A27 27 weeks gestation of pregnancy: Secondary | ICD-10-CM | POA: Diagnosis not present

## 2018-07-28 DIAGNOSIS — Z3A28 28 weeks gestation of pregnancy: Secondary | ICD-10-CM | POA: Diagnosis not present

## 2018-07-28 DIAGNOSIS — N200 Calculus of kidney: Secondary | ICD-10-CM | POA: Diagnosis not present

## 2018-07-28 DIAGNOSIS — O2242 Hemorrhoids in pregnancy, second trimester: Secondary | ICD-10-CM | POA: Diagnosis present

## 2018-07-28 DIAGNOSIS — O26833 Pregnancy related renal disease, third trimester: Secondary | ICD-10-CM | POA: Diagnosis not present

## 2018-07-28 DIAGNOSIS — N202 Calculus of kidney with calculus of ureter: Secondary | ICD-10-CM | POA: Diagnosis present

## 2018-07-28 DIAGNOSIS — Z87442 Personal history of urinary calculi: Secondary | ICD-10-CM | POA: Diagnosis not present

## 2018-07-28 DIAGNOSIS — A419 Sepsis, unspecified organism: Secondary | ICD-10-CM | POA: Diagnosis present

## 2018-07-28 LAB — CBC
HCT: 23.7 % — ABNORMAL LOW (ref 36.0–46.0)
Hemoglobin: 7.8 g/dL — ABNORMAL LOW (ref 12.0–15.0)
MCH: 28.3 pg (ref 26.0–34.0)
MCHC: 32.9 g/dL (ref 30.0–36.0)
MCV: 85.9 fL (ref 80.0–100.0)
Platelets: 188 10*3/uL (ref 150–400)
RBC: 2.76 MIL/uL — ABNORMAL LOW (ref 3.87–5.11)
RDW: 14.1 % (ref 11.5–15.5)
WBC: 8.5 10*3/uL (ref 4.0–10.5)
nRBC: 0 % (ref 0.0–0.2)

## 2018-07-28 MED ORDER — OXYBUTYNIN CHLORIDE ER 10 MG PO TB24: 10.0000 mg | ORAL_TABLET | Freq: Every day | ORAL | 0 refills | Status: AC

## 2018-07-28 MED ORDER — FLUCONAZOLE 150 MG PO TABS: 150.0000 mg | ORAL_TABLET | Freq: Once | ORAL | 1 refills | Status: AC

## 2018-07-28 MED ORDER — PHENOL 1.4 % MT LIQD
1.0000 | OROMUCOSAL | Status: DC | PRN
  Administered 2018-07-28: 1 via OROMUCOSAL
  Filled 2018-07-28: qty 177

## 2018-07-28 MED ORDER — SIMETHICONE 80 MG PO CHEW
80.0000 mg | CHEWABLE_TABLET | Freq: Four times a day (QID) | ORAL | Status: DC | PRN
  Administered 2018-07-28: 80 mg via ORAL
  Filled 2018-07-28: qty 1

## 2018-07-28 MED ORDER — AMPICILLIN 500 MG PO CAPS: 1000.0000 mg | ORAL_CAPSULE | Freq: Four times a day (QID) | ORAL | 0 refills | Status: AC

## 2018-07-28 NOTE — Telephone Encounter (Signed)
Spoke with Deneise Lever at 4:20 today, 07/28/18, and she asked that I contact Heather in Pre-Admit because the patient is still an inpatient today, and that they need to follow up with Dr. Diamantina Providence, who did the stent placement.

## 2018-07-28 NOTE — Progress Notes (Signed)
   Subjective Afebrile overnight, right-sided flank pain resolved Having some discomfort and bladder spasms with catheter in place Overall feels well  Physical Exam: BP (!) 117/46 (BP Location: Left Arm)   Pulse (!) 116   Temp 98.4 F (36.9 C) (Oral)   Resp 18   Ht 5\' 7"  (1.702 m)   Wt 85.7 kg   LMP 01/09/2018   SpO2 98%   BMI 29.60 kg/m    Constitutional:  Alert and oriented, No acute distress. Respiratory: Normal respiratory effort, no increased work of breathing. GU: No CVA tenderness Drains: Clear yellow urine per Foley  Laboratory Data: Gram stain with gram-positive cocci in pairs, culture pending  Assessment & Plan:   Patient is a 28 year old female [redacted] weeks pregnant with a right 1 cm distal ureteral stone with severe pain, fever to 102.4, leukocytosis, and urinalysis concerning for infection.  She underwent uncomplicated cystoscopy and right ureteral stent placement on 07/27/2018.  -Follow-up urine cultures to confirm appropriate antibiotics -Okay to remove Foley -Continue ampicillin and ceftriaxone, narrow as able, recommend 10-14 day course -We will arrange definitive management of her right-sided ureteral stone after infection treated with my partner Dr. Erlene Quan -Call if questions   Billey Co, MD

## 2018-07-28 NOTE — Telephone Encounter (Signed)
Heather called from Pre-Admit and stated that Dr. Erlene Quan, urologist, wants the patient to have an NST before and after the procedure on March 5.  She is the first patient that morning at 6 AM.  I was asked to call Dr. Amalia Hailey, per CM, and LVM for Dr. Amalia Hailey, provider on-call, to see if he wants the patient to have the NST here on the 4th of March or if he would prefer for the patient to go to L&D that morning to have the NST, and if he wants her to have it here would he be willing to put in the order.  Please advise, thanks.

## 2018-07-28 NOTE — Progress Notes (Addendum)
Patient ID: Evelyn Caldwell, female   DOB: 1990/10/16, 28 y.o.   MRN: [id number]  Evelyn Caldwell is a 28 y.o. G2P1001 at [redacted]w[redacted]d who is admitted for urosepsis and kidney stone management.  Estimated Date of Delivery: 10/20/18.   Status post cystoscopy with right ureteral stent placement.    Length of Stay:  1 Days. Admitted 07/27/2018  Subjective:  Here to evaluate patient after telephone call from nursing with reports of maternal and fetal tachycardia, shortness of breath with labored breathing and chills.   Patient reports that she was feeling fine until approximately 1700.   Patient reports good fetal movement.  She reports no uterine contractions, no bleeding and no loss of fluid per vagina.  Denies chest pain, abdominal pain, and leg pain or swelling.   Physical Examination:  Temp:  [97.7 F (36.5 C)-99 F (37.2 C)] 98.3 F (36.8 C) (02/28 1907) Pulse Rate:  [90-129] 129 (02/28 1950) Resp:  [16-20] 20 (02/28 1804) BP: (86-118)/(29-66) 118/36 (02/28 1950) SpO2:  [97 %-100 %] 100 % (02/28 1950)  CONSTITUTIONAL: Well-developed, well-nourished female    SKIN: Skin is warm and clampy. No rash noted. Diaphoretic.  [place]: Alert and oriented to person, place, and time. Normal reflexes, muscle tone coordination. No cranial nerve deficit noted.  PSYCHIATRIC: Normal mood and affect. Normal behavior. Normal  judgment and thought content.  CARDIOVASCULAR: Tachycardic  RESPIRATORY: Labored breathing  MUSCULOSKELETAL: Normal range of motion. No edema and no tenderness. 2+ distal pulses.  ABDOMEN: Soft, nontender, nondistended, gravid.  Fetal monitoring: FHR: 1** bpm, Variability: moderate, Accelerations: Present, Decelerations: Absent   Uterine activity: None  Results for orders placed or performed during the hospital encounter of 07/27/18 (from the past 48 hour(s))  CBC     Status: Abnormal   Collection Time: 07/27/18 11:12 AM  Result Value Ref Range   WBC  13.7 (H) 4.0 - 10.5 K/uL   RBC 3.14 (L) 3.87 - 5.11 MIL/uL   Hemoglobin 8.8 (L) 12.0 - 15.0 g/dL   HCT 26.3 (L) 36.0 - 46.0 %   MCV 83.8 80.0 - 100.0 fL   MCH 28.0 26.0 - 34.0 pg   MCHC 33.5 30.0 - 36.0 g/dL   RDW 13.6 11.5 - 15.5 %   Platelets 199 150 - 400 K/uL   nRBC 0.0 0.0 - 0.2 %    Comment: Performed at [facility], [address] [place], [place] [address]  Type and screen [place]     Status: None   Collection Time: 07/27/18  2:46 PM  Result Value Ref Range   ABO/RH(D) A POS    Antibody Screen NEG    Sample Expiration      07/30/2018 Performed at [facility], [address], [place], [place] [address]   Influenza panel by PCR (type A & B)     Status: None   Collection Time: 07/27/18  3:11 PM  Result Value Ref Range   Influenza A By PCR NEGATIVE NEGATIVE   Influenza B By PCR NEGATIVE NEGATIVE    Comment: (NOTE) The Xpert Xpress Flu assay is intended as an aid in the diagnosis of  influenza and should not be used as a sole basis for treatment.  This  assay is FDA approved for nasopharyngeal swab specimens only. Nasal  washings and aspirates are unacceptable for Xpert Xpress Flu testing. Performed at [facility], [address], [place], [place] [address]   Urinalysis, Complete w Microscopic  Status: Abnormal   Collection Time: 07/27/18  3:11 PM  Result Value Ref Range   Color, Urine AMBER (A) YELLOW   APPearance TURBID (A) CLEAR   Specific Gravity, Urine 1.026 1.005 - 1.030   pH 5.0 5.0 - 8.0   Glucose, UA NEGATIVE NEGATIVE mg/dL   Hgb urine dipstick SMALL (A) NEGATIVE   Bilirubin Urine NEGATIVE NEGATIVE   Ketones, ur 80 (A) NEGATIVE mg/dL   Protein, ur 100 (A) NEGATIVE mg/dL   Nitrite NEGATIVE NEGATIVE   Leukocytes,Ua MODERATE (A) NEGATIVE   RBC / HPF 21-50 0 - 5 RBC/hpf   WBC, UA >50 (H) 0 - 5 WBC/hpf   Bacteria, UA MANY (A) NONE SEEN   Squamous Epithelial / LPF 6-10 0 - 5   WBC Clumps PRESENT     Mucus PRESENT     Comment: Performed at [facility], [address] [place], [place] [address]  Urine Culture     Status: Abnormal (Preliminary result)   Collection Time: 07/27/18  3:11 PM  Result Value Ref Range   Specimen Description      URINE, RANDOM Performed at [facility], [address] [place] [address] [place], [place] [address]    Special Requests      NONE Performed at [facility], [place], [place] [address]    Culture (A)     >=100,000 COLONIES/mL GRAM POSITIVE COCCI IDENTIFICATION AND SUSCEPTIBILITIES TO FOLLOW Performed at [facility] [address] [place], [place] [address]    Report Status PENDING   Aerobic/Anaerobic Culture (surgical/deep wound)     Status: None (Preliminary result)   Collection Time: 07/27/18  5:16 PM  Result Value Ref Range   Specimen Description NEEDLE ASPIRATE    Special Requests      RIGHT RENAL Performed at [facility], [address] [place], [place] [address]    Gram Stain NO WBC SEEN FEW GRAM POSITIVE COCCI     Culture PENDING    Report Status PENDING   Anaerobic culture     Status: None (Preliminary result)   Collection Time: 07/27/18  5:26 PM  Result Value Ref Range   Specimen Description      URINE, RANDOM RIGHT RENAL ASPIRATE Performed at [facility], [address], [place], [place] [address]    Special Requests      NONE Performed at [facility], [address] [place], [place] [address]    Gram Stain      NO WBC SEEN FEW GRAM POSITIVE COCCI IN CHAINS Performed at [facility], [place] [address] [place], [place] [address]    Culture PENDING    Report Status PENDING     US Renal  Result Date: 07/27/2018 CLINICAL DATA:  Kidney stone complicating pregnancy. EXAM: RENAL / URINARY TRACT ULTRASOUND COMPLETE COMPARISON:  CT abdomen pelvis July 26, 2018 FINDINGS: Right Kidney: Renal measurements: 13.6 x 6.7 x 7 cm = volume: 331 mL.  Moderate right hydronephrosis is identified. Right renal calculi are noted. Left Kidney: Renal measurements: 12.6 x 6.7 x 5.8 cm = volume: 254 mL. Echogenicity within normal limits. No mass or hydronephrosis visualized. Bladder: Appears normal for degree of bladder distention. IMPRESSION: Moderate right hydronephrosis.  Right renal calculi are noted. Electronically Signed   By: Abelardo Diesel M.D.   On: 07/27/2018 12:28    Current scheduled medications . docusate sodium  100 mg Oral Daily  . prenatal multivitamin  1 tablet Oral Q1200  I have reviewed the patient's current medications.  ASSESSMENT: Patient Active Problem List   Diagnosis Date Noted  . Labor and delivery, indication for care 07/27/2018  . Kidney stone complicating pregnancy, second trimester 07/26/2018  . Pregnancy 07/25/2018  . Internal hemorrhoids 08/18/2016  . Status post vacuum-assisted vaginal delivery 03/09/2016  . Hydronephrosis 01/26/2016  . Nephrolithiasis 10/22/2015  . Headache, migraine 01/24/2015    PLAN:  2008 Telephone call to Dr. Bernardo Heater, [facility], regarding mutual patient. Notes since stent placement no further interventions needed from urology at this time.   2010 Telephone call to Hospitalist for STAT consult. Will call back after reviewing patient chart.   2015 Update given to Dr. Amalia Hailey.   2018 Spoke with Dr. Posey Pronto advised that patient should be transfer to ICU for close monitoring. States that if bed is unavailable then the patient should be transfer out of our facility.   2020 Telephone call to nursing supervisor requesting ICU bed.   2048 Telephone call from nursing supervisor. States intensivist in ICU requests patient be transfer for care.   2048 Telephone call to Dr Amalia Hailey to transfer patient.    Diona Fanti, CNM [facility], [facility] 07/28/18 8:38 PM

## 2018-07-28 NOTE — Progress Notes (Signed)
ANTEPARTUM COMPREHENSIVE PROGRESS NOTE  Evelyn Caldwell is a 28 y.o. G2P1001 at [redacted]w[redacted]d who is admitted for Kidney stones. Status post stent placement.  Estimated Date of Delivery: 10/20/18   Length of Stay:  0 Days. Admitted 07/27/2018  Subjective: Doing well. Currently having not pain. Feels much better since foley removed. Patient reports good fetal movement.  She reports no uterine contractions, no bleeding and no loss of fluid per vagina.  Vitals:  Blood pressure (!) 117/46, pulse (!) 116, temperature 98.4 F (36.9 C), temperature source Oral, resp. rate 18, height 5\' 7"  (1.702 m), weight 85.7 kg, last menstrual period 01/09/2018, SpO2 98 %, currently breastfeeding. Physical Examination: CONSTITUTIONAL: Well-developed, well-nourished female in no acute distress.  HENT:  Normocephalic, atraumatic NECK: Normal range of motion, supple, no masses SKIN: Skin is warm and dry. No rash noted. Not diaphoretic. No erythema. No pallor. [place]: Alert and oriented to person, place, and time. Normal reflexes, muscle tone coordination. No cranial nerve deficit noted. PSYCHIATRIC: Normal mood and affect. Normal behavior. Normal judgment and thought content. CARDIOVASCULAR: Normal heart rate noted, regular rhythm RESPIRATORY: Effort and breath sounds normal, no problems with respiration noted MUSCULOSKELETAL: Normal range of motion. No edema and no tenderness. ABDOMEN: Soft, nontender, nondistended, gravid. CERVIX:  Deferred  Fetal monitoring: NST has not been completed yet  Uterine activity: NST not competed  Results for orders placed or performed during the hospital encounter of 07/27/18 (from the past 48 hour(s))  CBC     Status: Abnormal   Collection Time: 07/27/18 11:12 AM  Result Value Ref Range   WBC 13.7 (H) 4.0 - 10.5 K/uL   RBC 3.14 (L) 3.87 - 5.11 MIL/uL   Hemoglobin 8.8 (L) 12.0 - 15.0 g/dL   HCT 26.3 (L) 36.0 - 46.0 %   MCV 83.8 80.0 - 100.0 fL   MCH 28.0 26.0 - 34.0 pg    MCHC 33.5 30.0 - 36.0 g/dL   RDW 13.6 11.5 - 15.5 %   Platelets 199 150 - 400 K/uL   nRBC 0.0 0.0 - 0.2 %    Comment: Performed at [facility], [address] [place], [place] [address]  Type and screen [place]     Status: None   Collection Time: 07/27/18  2:46 PM  Result Value Ref Range   ABO/RH(D) A POS    Antibody Screen NEG    Sample Expiration      07/30/2018 Performed at [facility], [address], [place], [place] [address]   Influenza panel by PCR (type A & B)     Status: None   Collection Time: 07/27/18  3:11 PM  Result Value Ref Range   Influenza A By PCR NEGATIVE NEGATIVE   Influenza B By PCR NEGATIVE NEGATIVE    Comment: (NOTE) The Xpert Xpress Flu assay is intended as an aid in the diagnosis of  influenza and should not be used as a sole basis for treatment.  This  assay is FDA approved for nasopharyngeal swab specimens only. Nasal  washings and aspirates are unacceptable for Xpert Xpress Flu testing. Performed at [facility], [address] [place], [place] [address]   Urinalysis, Complete w Microscopic     Status: Abnormal   Collection Time: 07/27/18  3:11 PM  Result Value Ref Range   Color, Urine AMBER (A) YELLOW   APPearance TURBID (A) CLEAR   Specific Gravity, Urine 1.026 1.005 - 1.030   pH 5.0 5.0 - 8.0  Glucose, UA NEGATIVE NEGATIVE mg/dL   Hgb urine dipstick SMALL (A) NEGATIVE   Bilirubin Urine NEGATIVE NEGATIVE   Ketones, ur 80 (A) NEGATIVE mg/dL   Protein, ur 100 (A) NEGATIVE mg/dL   Nitrite NEGATIVE NEGATIVE   Leukocytes,Ua MODERATE (A) NEGATIVE   RBC / HPF 21-50 0 - 5 RBC/hpf   WBC, UA >50 (H) 0 - 5 WBC/hpf   Bacteria, UA MANY (A) NONE SEEN   Squamous Epithelial / LPF 6-10 0 - 5   WBC Clumps PRESENT    Mucus PRESENT     Comment: Performed at [facility], [address] [place], [place] [address]  Anaerobic culture     Status: None (Preliminary result)   Collection  Time: 07/27/18  5:26 PM  Result Value Ref Range   Specimen Description      URINE, RANDOM RIGHT RENAL ASPIRATE Performed at [facility], [address] [place], [place] [address]    Special Requests      NONE Performed at [facility], [place], [place] [address]    Gram Stain      NO WBC SEEN FEW GRAM POSITIVE COCCI IN CHAINS Performed at [facility], [place] [address], [place], [place] [address]    Culture PENDING    Report Status PENDING     US Renal  Result Date: 07/27/2018 CLINICAL DATA:  Kidney stone complicating pregnancy. EXAM: RENAL / URINARY TRACT ULTRASOUND COMPLETE COMPARISON:  CT abdomen pelvis July 26, 2018 FINDINGS: Right Kidney: Renal measurements: 13.6 x 6.7 x 7 cm = volume: 331 mL. Moderate right hydronephrosis is identified. Right renal calculi are noted. Left Kidney: Renal measurements: 12.6 x 6.7 x 5.8 cm = volume: 254 mL. Echogenicity within normal limits. No mass or hydronephrosis visualized. Bladder: Appears normal for degree of bladder distention. IMPRESSION: Moderate right hydronephrosis.  Right renal calculi are noted. Electronically Signed   By: Abelardo Diesel M.D.   On: 07/27/2018 12:28   Ct Renal Stone Study  Result Date: 07/26/2018 CLINICAL DATA:  Right flank pain since January with gross hematuria. EXAM: CT ABDOMEN AND PELVIS WITHOUT CONTRAST TECHNIQUE: Multidetector CT imaging of the abdomen and pelvis was performed following the standard protocol without IV contrast. COMPARISON:  06/03/2014 FINDINGS: Lower chest: No acute abnormality. Hepatobiliary: No focal liver abnormality is seen. No gallstones, gallbladder wall thickening, or biliary dilatation. Pancreas: Unremarkable. No pancreatic ductal dilatation or surrounding inflammatory changes. Spleen: Normal in size without focal abnormality. Adrenals/Urinary Tract: Normal appearance of the adrenal glands. Bilateral nephrolithiasis identified. There is right-sided  nephromegaly, perinephric fat stranding and hydronephrosis with hydroureter. Within the distal right ureter there is a stone which measures 6 x 7 by 10 mm. This is just proximal to the right UVJ. Urinary bladder appears normal. No left hydronephrosis or hydroureter. Stomach/Bowel: Stomach normal. No abnormal small or large bowel dilatation identified. Vascular/Lymphatic: Normal appearance of the abdominal aorta. No adenopathy identified. No pelvic or inguinal adenopathy. Reproductive: Gravid uterus compatible with [redacted] week gestation is identified. Other: No free fluid or fluid collections. Musculoskeletal: No acute or significant osseous findings. IMPRESSION: 1. Large distal right ureteral calculus measures 6 x 7 x 10 mm. This results in right-sided hydronephrosis and hydroureter. 2. Bilateral nephrolithiasis. 3. Gravid uterus compatible with a [redacted] week gestation. Electronically Signed   By: Kerby Moors M.D.   On: 07/26/2018 14:38    Current scheduled medications . docusate sodium  100 mg Oral Daily  . prenatal multivitamin  1 tablet Oral Q1200    I have reviewed the patient's current medications.  ASSESSMENT: Active Problems:   Kidney stone complicating pregnancy, second trimester   Labor and delivery, indication for care   PLAN: Continue antibiotics, plan for discharge in am.  Continue routine antenatal care.   Philip Aspen, CNM

## 2018-07-28 NOTE — Progress Notes (Signed)
Orders placed for ampicillin, diflucan , and oxybutynin for home treatment once discharged from hospital.   Philip Aspen, CNM

## 2018-07-28 NOTE — Telephone Encounter (Signed)
Please contact Evelyn Caldwell about patient. She is Midwife patient.

## 2018-07-28 NOTE — Progress Notes (Signed)
RN called and notified me of completion of NST. NST was completed at 1550. Call received @  1800.   Baseline: 135 Variability: Moderate Accelerations: present Decelerations: possible decel to 120's noted at start of strip  [place]: no contractions present.   After reviewing strip. Call placed to have RN placed back on EFM for monitoring for further evaluation of fetal heart rate.   Philip Aspen, CNM

## 2018-07-28 NOTE — Progress Notes (Signed)
Patient ID: Evelyn Caldwell, female   DOB: [birth year], 28 y.o.   MRN: [id number]  2120 Telephone call to [facility] transfer line. Connected to Dr. Samuel Jester and Dr. Jorje Guild. Will accept patient into a MICU bed then consult OB. Awaiting call for bed placement.    Diona Fanti, CNM [facility], [facility] 07/28/18 10:51 PM

## 2018-07-28 NOTE — Progress Notes (Signed)
I charted on patient NST, to ensure FHR was documented in chart. Provider was made aware of the strip and has asked that we provide more EFM. Pt will be placed back on the monitor for a longer time period of time to ensure that FHR is appropriate.

## 2018-07-28 NOTE — Progress Notes (Signed)
[facility] ground transport called to notify RN that they would be arriving in 40 minutes. RN aware and will prepare patient for transfer.

## 2018-07-28 NOTE — Progress Notes (Signed)
Orpha Bur, RN called report to Yetta Flock, MICU RN, and also Hilliard Clark from [facility] air care.

## 2018-07-28 NOTE — Progress Notes (Signed)
RN notified [place] CNM that patient was brought back from mother baby for qshift NST. Upon retrieving pt, RN noticed labored breathing, hypotension, tachycardia. Pt reported having chills too.  Pt fetal heart strip was tachy as well, no ctx present. Provider to come see pt. Will continue to monitor.

## 2018-07-28 NOTE — Progress Notes (Addendum)
Patient ID: Evelyn Caldwell, female   DOB: 16-Sep-1990, 28 y.o.   MRN: [id number]  Telephone call from [facility]. Patient assigned to MICU 4318. RN will call Transfer Center with report.    Diona Fanti, CNM [facility], [facility] 07/28/18 11:11 PM

## 2018-07-29 LAB — URINE CULTURE
Culture: 40000 — AB
Culture: >=100000 — AB

## 2018-07-29 NOTE — Discharge Summary (Addendum)
RN reported off care of patient to transport team and took a set of vitals on patient. Pt transferred to [facility] via ground transport.

## 2018-07-31 ENCOUNTER — Other Ambulatory Visit: Payer: Self-pay | Admitting: Radiology

## 2018-07-31 MED ORDER — GENERIC EXTERNAL MEDICATION
Status: DC
Start: ? — End: 2018-07-31

## 2018-07-31 MED ORDER — HEPARIN SODIUM (PORCINE) 10000 UNIT/ML IJ SOLN
10000.00 | INTRAMUSCULAR | Status: DC
Start: 2018-07-31 — End: 2018-07-31

## 2018-07-31 MED ORDER — GENERIC EXTERNAL MEDICATION
2.00 | Status: DC
Start: 2018-07-31 — End: 2018-07-31

## 2018-07-31 MED ORDER — MAGNESIUM OXIDE 400 MG PO TABS
400.00 | ORAL_TABLET | ORAL | Status: DC
Start: 2018-07-31 — End: 2018-07-31

## 2018-07-31 MED ORDER — PNV PRENATAL PLUS MULTIVITAMIN 27-1 MG PO TABS
1.00 | ORAL_TABLET | ORAL | Status: DC
Start: 2018-07-31 — End: 2018-07-31

## 2018-07-31 MED ORDER — INFLUENZA VAC SPLIT QUAD 0.5 ML IM SUSY
0.50 | PREFILLED_SYRINGE | INTRAMUSCULAR | Status: DC
Start: ? — End: 2018-07-31

## 2018-07-31 MED ORDER — ACETAMINOPHEN 325 MG PO TABS
650.00 | ORAL_TABLET | ORAL | Status: DC
Start: ? — End: 2018-07-31

## 2018-08-02 ENCOUNTER — Other Ambulatory Visit: Payer: Self-pay | Admitting: Obstetrics and Gynecology

## 2018-08-02 ENCOUNTER — Other Ambulatory Visit: Payer: Self-pay | Admitting: Radiology

## 2018-08-02 DIAGNOSIS — N200 Calculus of kidney: Secondary | ICD-10-CM

## 2018-08-02 LAB — AEROBIC/ANAEROBIC CULTURE W GRAM STAIN (SURGICAL/DEEP WOUND): Gram Stain: NONE SEEN

## 2018-08-02 MED ORDER — CEFAZOLIN SODIUM-DEXTROSE 2-4 GM/100ML-% IV SOLN: 2.0000 g | INTRAVENOUS | Status: DC

## 2018-08-02 NOTE — Anesthesia Preprocedure Evaluation (Addendum)
Anesthesia Evaluation  Patient identified by MRN, date of birth, ID band Patient awake    Reviewed: Allergy & Precautions, H&P , NPO status , Patient's Chart, lab work & pertinent test results  History of Anesthesia Complications (+) history of anesthetic complications ("woke up crazy, swinging")  Airway Mallampati: I  TM Distance: <3 FB     Dental  (+) Teeth Intact   Pulmonary neg pulmonary ROS,           Cardiovascular negative cardio ROS       Neuro/Psych  Headaches, negative psych ROS   GI/Hepatic negative GI ROS, Neg liver ROS,   Endo/Other  negative endocrine ROS  Renal/GU Renal disease (stones, hydronephrosis)     Musculoskeletal   Abdominal   Peds  Hematology negative hematology ROS (+)   Anesthesia Other Findings Past Medical History: No date: Kidney stone No date: Vulvar lesion  Past Surgical History: 07/27/2018: CYSTOSCOPY WITH STENT PLACEMENT; Right     Comment:  Procedure: CYSTOSCOPY WITH STENT PLACEMENT;  Surgeon:               Billey Co, MD;  Location: [facility] ORS;  Service:               Urology;  Laterality: Right; No date: none     Reproductive/Obstetrics (+) Pregnancy (29 weeks)                            Anesthesia Physical Anesthesia Plan  ASA: II  Anesthesia Plan: General ETT   Post-op Pain Management:    Induction: Rapid sequence and Intravenous  PONV Risk Score and Plan: Dexamethasone, Ondansetron and Treatment may vary due to age or medical condition  Airway Management Planned: Oral ETT  Additional Equipment:   Intra-op Plan:   Post-operative Plan:   Informed Consent: I have reviewed the patients History and Physical, chart, labs and discussed the procedure including the risks, benefits and alternatives for the proposed anesthesia with the patient or authorized representative who has indicated his/her understanding and acceptance.      Dental Advisory Given  Plan Discussed with: Anesthesiologist and CRNA  Anesthesia Plan Comments: (NST before and after procedure, continuous fetal monitoring intraoperatively, Dr. Amalia Hailey (OB) on standby.)      Anesthesia Quick Evaluation

## 2018-08-03 ENCOUNTER — Encounter: Admission: EM | Disposition: A | Discharge status: Home / Self Care | Payer: Self-pay | Source: Home / Self Care

## 2018-08-03 ENCOUNTER — Other Ambulatory Visit: Payer: Self-pay

## 2018-08-03 ENCOUNTER — Observation Stay
Admission: EM | Admit: 2018-08-03 | Discharge: 2018-08-03 | Disposition: A | Discharge status: Home / Self Care | Payer: 59 | Attending: Obstetrics and Gynecology | Admitting: Obstetrics and Gynecology

## 2018-08-03 ENCOUNTER — Observation Stay: Payer: 59 | Admitting: Anesthesiology

## 2018-08-03 ENCOUNTER — Other Ambulatory Visit: Payer: 59

## 2018-08-03 ENCOUNTER — Encounter: Payer: 59 | Admitting: Certified Nurse Midwife

## 2018-08-03 ENCOUNTER — Ambulatory Visit: Admission: RE | Admit: 2018-08-03 | Payer: 59 | Source: Home / Self Care | Admitting: Urology

## 2018-08-03 DIAGNOSIS — N200 Calculus of kidney: Secondary | ICD-10-CM

## 2018-08-03 DIAGNOSIS — Z3A27 27 weeks gestation of pregnancy: Secondary | ICD-10-CM | POA: Diagnosis not present

## 2018-08-03 DIAGNOSIS — Z87440 Personal history of urinary (tract) infections: Secondary | ICD-10-CM | POA: Insufficient documentation

## 2018-08-03 DIAGNOSIS — Z87442 Personal history of urinary calculi: Secondary | ICD-10-CM | POA: Insufficient documentation

## 2018-08-03 DIAGNOSIS — N201 Calculus of ureter: Secondary | ICD-10-CM | POA: Diagnosis not present

## 2018-08-03 DIAGNOSIS — N132 Hydronephrosis with renal and ureteral calculous obstruction: Secondary | ICD-10-CM | POA: Diagnosis present

## 2018-08-03 DIAGNOSIS — O9989 Other specified diseases and conditions complicating pregnancy, childbirth and the puerperium: Secondary | ICD-10-CM | POA: Diagnosis not present

## 2018-08-03 HISTORY — PX: CYSTOSCOPY/URETEROSCOPY/HOLMIUM LASER/STENT PLACEMENT: SHX6546

## 2018-08-03 SURGERY — CYSTOSCOPY/URETEROSCOPY/HOLMIUM LASER/STENT PLACEMENT
Anesthesia: General | Site: Ureter | Laterality: Right

## 2018-08-03 MED ORDER — FENTANYL CITRATE (PF) 100 MCG/2ML IJ SOLN
INTRAMUSCULAR | Status: DC | PRN
  Administered 2018-08-03 (×2): 50 ug via INTRAVENOUS

## 2018-08-03 MED ORDER — CEFAZOLIN SODIUM-DEXTROSE 2-4 GM/100ML-% IV SOLN
INTRAVENOUS | Status: AC
  Filled 2018-08-03: qty 100

## 2018-08-03 MED ORDER — ACETAMINOPHEN 325 MG PO TABS: 650.0000 mg | ORAL_TABLET | ORAL | Status: DC | PRN

## 2018-08-03 MED ORDER — DEXAMETHASONE SODIUM PHOSPHATE 10 MG/ML IJ SOLN
INTRAMUSCULAR | Status: DC | PRN
  Administered 2018-08-03: 10 mg via INTRAVENOUS

## 2018-08-03 MED ORDER — EPHEDRINE SULFATE 50 MG/ML IJ SOLN
INTRAMUSCULAR | Status: DC | PRN
  Administered 2018-08-03 (×3): 5 mg via INTRAVENOUS

## 2018-08-03 MED ORDER — ONDANSETRON HCL 4 MG/2ML IJ SOLN
INTRAMUSCULAR | Status: DC | PRN
  Administered 2018-08-03: 4 mg via INTRAVENOUS

## 2018-08-03 MED ORDER — CEFAZOLIN SODIUM-DEXTROSE 2-3 GM-%(50ML) IV SOLR
INTRAVENOUS | Status: DC | PRN
  Administered 2018-08-03: 2 g via INTRAVENOUS

## 2018-08-03 MED ORDER — PROPOFOL 10 MG/ML IV BOLUS
INTRAVENOUS | Status: DC | PRN
  Administered 2018-08-03: 100 mg via INTRAVENOUS

## 2018-08-03 MED ORDER — LIDOCAINE HCL 4 % MT SOLN
OROMUCOSAL | Status: DC | PRN
  Administered 2018-08-03: 4 mL via TOPICAL

## 2018-08-03 MED ORDER — FENTANYL CITRATE (PF) 100 MCG/2ML IJ SOLN: 25.0000 ug | INTRAMUSCULAR | Status: DC | PRN

## 2018-08-03 MED ORDER — LACTATED RINGERS IV SOLN: INTRAVENOUS | Status: DC

## 2018-08-03 MED ORDER — SUCCINYLCHOLINE CHLORIDE 20 MG/ML IJ SOLN
INTRAMUSCULAR | Status: DC | PRN
  Administered 2018-08-03: 100 mg via INTRAVENOUS

## 2018-08-03 MED ORDER — LIDOCAINE HCL (CARDIAC) PF 100 MG/5ML IV SOSY
PREFILLED_SYRINGE | INTRAVENOUS | Status: DC | PRN
  Administered 2018-08-03: 80 mg via INTRAVENOUS

## 2018-08-03 MED ORDER — DEXMEDETOMIDINE HCL 200 MCG/2ML IV SOLN
INTRAVENOUS | Status: DC | PRN
  Administered 2018-08-03: 8 ug via INTRAVENOUS
  Administered 2018-08-03: 4 ug via INTRAVENOUS

## 2018-08-03 MED ORDER — PHENYLEPHRINE HCL 10 MG/ML IJ SOLN
INTRAMUSCULAR | Status: DC | PRN
  Administered 2018-08-03 (×3): 50 ug via INTRAVENOUS

## 2018-08-03 MED ORDER — LACTATED RINGERS IV SOLN
INTRAVENOUS | Status: DC
  Administered 2018-08-03: 06:00:00 via INTRAVENOUS

## 2018-08-03 MED ORDER — GLYCOPYRROLATE 0.2 MG/ML IJ SOLN
INTRAMUSCULAR | Status: DC | PRN
  Administered 2018-08-03: 0.2 mg via INTRAVENOUS

## 2018-08-03 SURGICAL SUPPLY — 26 items
BAG DRAIN CYSTO-URO LG1000N (MISCELLANEOUS) ×2 IMPLANT
BASKET ZERO TIP 1.9FR (BASKET) ×2 IMPLANT
BRUSH SCRUB EZ 1% IODOPHOR (MISCELLANEOUS) ×2 IMPLANT
CATH URETL 5X70 OPEN END (CATHETERS) ×2 IMPLANT
CNTNR SPEC 2.5X3XGRAD LEK (MISCELLANEOUS) ×1
CONT SPEC 4OZ STER OR WHT (MISCELLANEOUS) ×1
CONTAINER SPEC 2.5X3XGRAD LEK (MISCELLANEOUS) ×1 IMPLANT
DRAPE UTILITY 15X26 TOWEL STRL (DRAPES) ×2 IMPLANT
FIBER LASER LITHO 273 (Laser) ×2 IMPLANT
GLOVE BIO SURGEON STRL SZ 6.5 (GLOVE) ×2 IMPLANT
GOWN STRL REUS W/ TWL LRG LVL3 (GOWN DISPOSABLE) ×2 IMPLANT
GOWN STRL REUS W/TWL LRG LVL3 (GOWN DISPOSABLE) ×2
GUIDEWIRE GREEN .038 145CM (MISCELLANEOUS) IMPLANT
GUIDEWIRE STR DUAL SENSOR (WIRE) ×4 IMPLANT
INFUSOR MANOMETER BAG 3000ML (MISCELLANEOUS) ×2 IMPLANT
INTRODUCER DILATOR DOUBLE (INTRODUCER) IMPLANT
KIT TURNOVER CYSTO (KITS) ×2 IMPLANT
PACK CYSTO AR (MISCELLANEOUS) ×2 IMPLANT
SET CYSTO W/LG BORE CLAMP LF (SET/KITS/TRAYS/PACK) ×2 IMPLANT
SHEATH URETERAL 12FRX35CM (MISCELLANEOUS) IMPLANT
SOL .9 NS 3000ML IRR  AL (IV SOLUTION) ×1
SOL .9 NS 3000ML IRR UROMATIC (IV SOLUTION) ×1 IMPLANT
STENT URET 6FRX24 CONTOUR (STENTS) ×2 IMPLANT
STENT URET 6FRX26 CONTOUR (STENTS) IMPLANT
SURGILUBE 2OZ TUBE FLIPTOP (MISCELLANEOUS) ×2 IMPLANT
WATER STERILE IRR 1000ML POUR (IV SOLUTION) ×2 IMPLANT

## 2018-08-03 NOTE — Anesthesia Post-op Follow-up Note (Signed)
Anesthesia QCDR form completed.        

## 2018-08-03 NOTE — Transfer of Care (Signed)
Immediate Anesthesia Transfer of Care Note  Patient: Evelyn Caldwell  Procedure(s) Performed: cystoscopy/ureteroscopy/Holmium laser/stent exchange  Patient Location: PACU  Anesthesia Type:General  Level of Consciousness: drowsy  Airway & Oxygen Therapy: Patient Spontanous Breathing and Patient connected to face mask oxygen  Post-op Assessment: Report given to RN and Post -op Vital signs reviewed and stable  Post vital signs: Reviewed and stable  Last Vitals:  Vitals Value Taken Time  BP 117/68 08/03/2018  8:39 AM  Temp    Pulse 106 08/03/2018  8:40 AM  Resp 19 08/03/2018  8:40 AM  SpO2 99 % 08/03/2018  8:40 AM  Vitals shown include unvalidated device data.  Last Pain:  Vitals:   08/03/18 0658  TempSrc: Tympanic  PainSc: 0-No pain         Complications: No apparent anesthesia complications

## 2018-08-03 NOTE — Discharge Summary (Addendum)
RN reviewed discharge instructions with patient and her husband. RN removed peripheral IV. Gave patient opportunity for questions. All questions answered at this time. Pt verbalized understanding. Pt discharged home with her significant other.

## 2018-08-03 NOTE — Anesthesia Procedure Notes (Signed)
Procedure Name: Intubation Date/Time: 08/03/2018 7:52 AM Performed by: Eben Burow, CRNA Pre-anesthesia Checklist: Patient identified, Emergency Drugs available, Suction available, Patient being monitored and Timeout performed Patient Re-evaluated:Patient Re-evaluated prior to induction Oxygen Delivery Method: Circle system utilized Preoxygenation: Pre-oxygenation with 100% oxygen Induction Type: IV induction Laryngoscope Size: McGraph and 3 Grade View: Grade I Tube type: Oral Tube size: 7.0 mm Number of attempts: 1 Airway Equipment and Method: Stylet,  Video-laryngoscopy and LTA kit utilized Placement Confirmation: positive ETCO2 and breath sounds checked- equal and bilateral Secured at: 21 cm Tube secured with: Tape Dental Injury: Teeth and Oropharynx as per pre-operative assessment

## 2018-08-03 NOTE — Discharge Instructions (Signed)
You have a ureteral stent in place.  This is a tube that extends from your kidney to your bladder.  This may cause urinary bleeding, burning with urination, and urinary frequency.  Please call our office or present to the ED if you develop fevers >101 or pain which is not able to be controlled with oral pain medications.  You may be given either Flomax and/ or ditropan to help with bladder spasms and stent pain in addition to pain medications.    Your stent is attached to a string.  This taped your left inner thigh.  I like you to keep this until Monday.  On Monday morning, you may untape the stent string and pull gently until the entire stent is removed.  If you have any issues or concerns, please call our office.  Please take care when you are bathing and using the bathroom not to accidentally dislodge the string or stent.  If the stent is accidentally dislodged, go ahead and remove it and let us know during normal office hours.  Please complete your antibiotic course.  [facility] [address] [place], [place] [address] [phone]

## 2018-08-03 NOTE — Op Note (Signed)
Date of procedure: 08/03/18  Preoperative diagnosis:  1. Right distal ureteral stone 2. History of recurrent enterococcus UTI/pyelonephritis  Postoperative diagnosis:  1. Same as above  Procedure: 1. Right ureteroscopy with laser lithotripsy 2. Right ureteral stent exchange 3. Right basket extraction of stone fragment  Surgeon: [name], MD  Anesthesia: General  Complications: None  Intraoperative findings: Large 1 cm right distal ureteral stone just at the opening of UO.  Fluoroscopy not utilized during procedure today.  Fetal monitoring throughout the procedure remained reassuring.  EBL: Minimal  Specimens: Stone fragment  Drains: 6 x 24 French double-J ureteral stent on right  Indication: Evelyn Caldwell is a 28 y.o. patient with personal history of obstructing right ureteral calculus in the setting of intrauterine gestational pregnancy.  She previously underwent imaging with a 1 cm right distal ureteral calculus and hydronephrosis.  She continued to have recurrent enterococcus urinary tract infections and ultimately presented with a pyelonephritis, early sepsis-like picture and underwent urgent ureteral stent placement by my partner, Dr. Diamantina Providence.  She was treated with several days of IV antibiotics followed by oral ampicillin which she continues today based on culture and sensitivity data.  She is clinically well today and returns today for definitive management of her stone.  She underwent NST fetal monitoring this morning and remains on fetal monitoring at this time in the perioperative holding area. After reviewing the management options for treatment, she elected to proceed with the above surgical procedure(s). We have discussed the potential benefits and risks of the procedure, side effects of the proposed treatment, the likelihood of the patient achieving the goals of the procedure, and any potential problems that might occur during the procedure or recuperation.  Informed consent has been obtained.,  We discussed the risk of the procedure today to her fetus as well as risk of early/preterm labor.  She understands all this and is agreeable to proceed.  Description of procedure:  The patient was taken to the operating room and general anesthesia was induced.  The patient was placed in the dorsal lithotomy position, prepped and draped in the usual sterile fashion, and preoperative antibiotics were administered. A preoperative time-out was performed.   A 21 French scope was advanced per urethra into the bladder.  Attention was turned to the right ureter orifice from which a ureteral stent was seen emanating.  The distal coil the stent was grasped and brought to level the urethral meatus.  Of note, there was some encrustation on the distal coil of the stent.  This was then cannulated using a sensor wire which traversed easily approximately the distance to the kidney and a Seldinger technique was used to leave the wire in place to remove the stent.  I elected to not use fluoroscopy at this point in time as I felt quite comfortable with the stent was in good position based on perioperative imaging the previous week and good position of the stent.  The safety wire was then snapped in place.  A 4.5 semirigid ureteroscope was advanced just within the UO where the stone was immediately encountered and was noted to be at least 1 cm in size.  A 273 micro-laser fiber was then brought in and using settings of 0.8 J and 10 Hz, the stone was fragmented in approximately 10 or so smaller pieces.  Each of these pieces was then extracted using 1.9 [place] tipless nitinol basket.  I did advance the scope further close to the level of the iliac and no  additional stone fragment was identified.  There was some debris-like material in the distal ureter which was easily irrigated out.  There is also some mucosal abrasion but no disruption appreciated.  The scope was then removed.  A 6 x 24 French  double-J ureteral stent was then advanced over the wire which went quite easily.  The wires partially drawn until presumably a coil within the renal pelvis was performed.  The wire was then fully withdrawn and a perfect coil was formed within the bladder.  The string was left affixed to the distal coil of the stent and taped to the patient's left inner thigh using Mastisol and Tegaderm.  She was then cleaned and dried, repositioned in supine position, reversed from anesthesia, taken the PACU in stable condition.  The procedure was well-tolerated and fetal monitoring throughout the procedure was reassuring.  Plan: We have her own stent on Monday.  We will have her follow-up in 4 weeks.  Renal ultrasound and follow-up will be somewhat limited given expected right hydronephrosis of pregnancy which is anticipated.  We will follow-up with imaging postdelivery.  [name], M.D.

## 2018-08-03 NOTE — Interval H&P Note (Signed)
History and Physical Interval Note:  08/03/2018 7:20 AM  Evelyn Caldwell  has presented today for surgery, with the diagnosis of right nephrolithiasis  The various methods of treatment have been discussed with the patient and family. After consideration of risks, benefits and other options for treatment, the patient has consented to  Procedure(s): CYSTOSCOPY/URETEROSCOPY/HOLMIUM LASER/STENT EXCHANGE (Right) as a surgical intervention .  The patient's history has been reviewed, patient examined, no change in status, stable for surgery.  I have reviewed the patient's chart and labs.  Questions were answered to the patient's satisfaction.    RRR CTAB  Notably, the patient was admitted with enterococcus pyelonephritis status post stent placement last week with my partner, Dr. Diamantina Providence.  She returns today for definitive management of her stone after having been treated with both IV antibiotics and with appropriate culture specific p.o. antibiotics over the past week.  Today, she is clinically well.  I have discussed this case at length with anesthesia as well as OB/GYN who are agreeable with today's plan of definitive treatment of her stone.  All questions answered.  [name]

## 2018-08-04 NOTE — Anesthesia Postprocedure Evaluation (Signed)
Anesthesia Post Note  Patient: [name]  Procedure(s) Performed:  Cystoscopy/Uteroscopy/Holmium Laser/Stent exchange  Patient location during evaluation: PACU Anesthesia Type: General Level of consciousness: awake and alert Pain management: pain level controlled Vital Signs Assessment: post-procedure vital signs reviewed and stable Respiratory status: spontaneous breathing, nonlabored ventilation, respiratory function stable and patient connected to nasal cannula oxygen Cardiovascular status: blood pressure returned to baseline and stable Postop Assessment: no apparent nausea or vomiting Anesthetic complications: no Comments: NST reassuring before and after procedure.  L&D nurse at bedside.     Last Vitals:  Vitals:   08/03/18 0909 08/03/18 0925  BP: (!) 120/57 (!) 121/59  Pulse: 95 (!) 106  Resp: 19   Temp: (!) 36.4 C 36.6 C  SpO2: 96%     Last Pain:  Vitals:   08/03/18 0925  TempSrc: Oral  PainSc:                  Durenda Hurt

## 2018-08-04 NOTE — Discharge Summary (Signed)
Antenatal Discharge Summary  Patient ID: Evelyn Caldwell MRN: [id number] DOB/AGE: 06/16/1990 28 y.o.  Admit date: 07/27/2018 Discharge date: 07/29/2018  Admission Diagnoses: Kidney stones at 27+6 weeks  Discharge Diagnoses: Status post cystoscopy with right ureteral stent placement, hydronephrosis, maternal and fetal tachycardia, hypotension, and possible urosepsis  Prenatal Procedures: Electronic Fetal Monitoring  Consults: Urology, Hospitalists  Significant Diagnostic Studies:   Results for orders placed or performed during the hospital encounter of 07/27/18 (from the past 168 hour(s))  CBC   Collection Time: 07/28/18  9:34 PM  Result Value Ref Range   WBC 8.5 4.0 - 10.5 K/uL   RBC 2.76 (L) 3.87 - 5.11 MIL/uL   Hemoglobin 7.8 (L) 12.0 - 15.0 g/dL   HCT 23.7 (L) 36.0 - 46.0 %   MCV 85.9 80.0 - 100.0 fL   MCH 28.3 26.0 - 34.0 pg   MCHC 32.9 30.0 - 36.0 g/dL   RDW 14.1 11.5 - 15.5 %   Platelets 188 150 - 400 K/uL   nRBC 0.0 0.0 - 0.2 %    Treatments: IV hydration, antibiotics: ceftriaxone and ampicillin IV, procedures: renal ultrasound and CT Scan and surgery: right cystoscopy with ueteral stent placement.   Hospital Course:  This is a 28 y.o. G2P1001 with IUP at [redacted]w[redacted]d admitted for admitted for management of kidney stones with possible pyleonephritis.  She was admitted without contractions, leakage of fluid and vaginal bleeding; so a cervical exam was deferred. She was placed on IV antibiotics after stent placement by urology showed signs of urosepsis. During antibiotic treatment, she developed hypotension and tachycardia. After consulting my OB/GYN, on call Urologist and the [facility] hospitalists, the decision was made to transfer the patient to a higher level of care.    Discharge Exam:  BP (!) 113/44   Pulse (!) 132   Temp 98.4 F (36.9 C)   Resp (!) 22   Ht 5\' 7"  (1.702 m)   Wt 85.7 kg   LMP 01/09/2018   SpO2 99%   BMI 29.60 kg/m    CONSTITUTIONAL:  Well-developed, well-nourished female    SKIN: Skin is warm and clampy. No rash noted. Diaphoretic.  [place]: Alert and oriented to person, place, and time. Normal reflexes, muscle tone coordination. No cranial nerve deficit noted.  PSYCHIATRIC: Normal mood and affect. Normal behavior. Normal  judgment and thought content.  CARDIOVASCULAR: Tachycardic  RESPIRATORY: Labored breathing  MUSCULOSKELETAL: Normal range of motion. No edema and no tenderness. 2+ distal pulses.  ABDOMEN: Soft, nontender, nondistended, gravid.  Fetal monitoring: FHR: 160 bpm, Variability: moderate, Accelerations: Present, Decelerations: Absent   Uterine activity: None  Discharge Condition: Stable for transfer  Disposition: [facility] MICU Room 4318   Allergies as of 07/29/2018   No Known Allergies     Medication List    ASK your doctor about these medications   acetaminophen 500 MG tablet Commonly known as:  TYLENOL Take 500-1,000 mg by mouth every 6 (six) hours as needed (HEADACHES).   ampicillin 500 MG capsule Commonly known as:  PRINCIPEN Take 2 capsules (1,000 mg total) by mouth 4 (four) times daily for 10 days.   Butalbital-APAP-Caffeine 50-325-40 MG capsule Take 1-2 capsules by mouth every 6 (six) hours as needed for headache.   fluconazole 150 MG tablet Commonly known as:  DIFLUCAN Take 1 tablet (150 mg total) by mouth once for 1 dose. Ask about: Should I take this medication?   magnesium oxide 400 (241.3 Mg) MG tablet Commonly known as:  MAG-OX Take 1  tablet (400 mg total) by mouth 2 (two) times daily as needed.   oxybutynin 10 MG 24 hr tablet Commonly known as:  DITROPAN-XL Take 1 tablet (10 mg total) by mouth at bedtime for 7 days.   oxyCODONE-acetaminophen 5-325 MG tablet Commonly known as:  PERCOCET/ROXICET Take 1-2 tablets by mouth every 6 (six) hours as needed for moderate pain or severe pain.   prenatal multivitamin Tabs tablet Take 1 tablet by mouth daily.    tamsulosin 0.4 MG Caps capsule Commonly known as:  FLOMAX Take 1 capsule (0.4 mg total) by mouth daily.   terconazole 0.4 % vaginal cream Commonly known as:  TERAZOL 7 Place 1 applicator vaginally at bedtime.       Diona Fanti, CNM [facility], [facility]

## 2018-08-08 ENCOUNTER — Ambulatory Visit (INDEPENDENT_AMBULATORY_CARE_PROVIDER_SITE_OTHER): Payer: 59 | Admitting: Certified Nurse Midwife

## 2018-08-08 ENCOUNTER — Other Ambulatory Visit: Payer: 59

## 2018-08-08 VITALS — BP 102/70 | HR 101 | Wt 184.4 lb

## 2018-08-08 DIAGNOSIS — Z131 Encounter for screening for diabetes mellitus: Secondary | ICD-10-CM

## 2018-08-08 DIAGNOSIS — Z130 Encounter for screening for diseases of the blood and blood-forming organs and certain disorders involving the immune mechanism: Secondary | ICD-10-CM

## 2018-08-08 DIAGNOSIS — Z3493 Encounter for supervision of normal pregnancy, unspecified, third trimester: Secondary | ICD-10-CM | POA: Diagnosis not present

## 2018-08-08 MED ORDER — AMOXICILLIN 500 MG PO CAPS: 500.0000 mg | ORAL_CAPSULE | Freq: Three times a day (TID) | ORAL | 0 refills | Status: AC

## 2018-08-08 MED ORDER — NITROFURANTOIN MONOHYD MACRO 100 MG PO CAPS: 100.0000 mg | ORAL_CAPSULE | Freq: Two times a day (BID) | ORAL | 2 refills | Status: DC

## 2018-08-08 MED ORDER — TETANUS-DIPHTH-ACELL PERTUSSIS 5-2.5-18.5 LF-MCG/0.5 IM SUSP
0.5000 mL | Freq: Once | INTRAMUSCULAR | Status: AC
  Administered 2018-08-08: 0.5 mL via INTRAMUSCULAR

## 2018-08-08 NOTE — Patient Instructions (Signed)
WE WOULD LOVE TO HEAR FROM YOU!!!!   Thank you Evelyn Caldwell for visiting [facility].  Providing our patients with the best experience possible is really important to [place], and we hope that you felt that on your recent visit. The most valuable feedback we get comes from [place]!!    If you receive a survey please take a couple of minutes to let us know how we did.Thank you for continuing to trust [place] with your care.   [facility]  Third Trimester of Pregnancy  The third trimester is from week 28 through week 40 (months 7 through 9). This trimester is when your unborn baby (fetus) is growing very fast. At the end of the ninth month, the unborn baby is about 20 inches in length. It weighs about 6-10 pounds. Follow these instructions at home: Medicines  Take over-the-counter and prescription medicines only as told by your doctor. Some medicines are safe and some medicines are not safe during pregnancy.  Take a prenatal vitamin that contains at least 600 micrograms (mcg) of folic acid.  If you have trouble pooping (constipation), take medicine that will make your stool soft (stool softener) if your doctor approves. Eating and drinking   Eat regular, healthy meals.  Avoid raw meat and uncooked cheese.  If you get low calcium from the food you eat, talk to your doctor about taking a daily calcium supplement.  Eat four or five small meals rather than three large meals a day.  Avoid foods that are high in fat and sugars, such as fried and sweet foods.  To prevent constipation: ? Eat foods that are high in fiber, like fresh fruits and vegetables, whole grains, and beans. ? Drink enough fluids to keep your pee (urine) clear or pale yellow. Activity  Exercise only as told by your doctor. Stop exercising if you start to have cramps.  Avoid heavy lifting, wear low heels, and sit up straight.  Do not exercise if it is too hot, too humid, or  if you are in a place of great height (high altitude).  You may continue to have sex unless your doctor tells you not to. Relieving pain and discomfort  Wear a good support bra if your breasts are tender.  Take frequent breaks and rest with your legs raised if you have leg cramps or low back pain.  Take warm water baths (sitz baths) to soothe pain or discomfort caused by hemorrhoids. Use hemorrhoid cream if your doctor approves.  If you develop puffy, bulging veins (varicose veins) in your legs: ? Wear support hose or compression stockings as told by your doctor. ? Raise (elevate) your feet for 15 minutes, 3-4 times a day. ? Limit salt in your food. Safety  Wear your seat belt when driving.  Make a list of emergency phone numbers, including numbers for family, friends, the hospital, and police and fire departments. Preparing for your baby's arrival To prepare for the arrival of your baby:  Take prenatal classes.  Practice driving to the hospital.  Visit the hospital and tour the maternity area.  Talk to your work about taking leave once the baby comes.  Pack your hospital bag.  Prepare the baby's room.  Go to your doctor visits.  Buy a rear-facing car seat. Learn how to install it in your car. General instructions  Do not use hot tubs, steam  rooms, or saunas.  Do not use any products that contain nicotine or tobacco, such as cigarettes and e-cigarettes. If you need help quitting, ask your doctor.  Do not drink alcohol.  Do not douche or use tampons or scented sanitary pads.  Do not cross your legs for long periods of time.  Do not travel for long distances unless you must. Only do so if your doctor says it is okay.  Visit your dentist if you have not gone during your pregnancy. Use a soft toothbrush to brush your teeth. Be gentle when you floss.  Avoid cat litter boxes and soil used by cats. These carry germs that can cause birth defects in the baby and can cause  a loss of your baby (miscarriage) or stillbirth.  Keep all your prenatal visits as told by your doctor. This is important. Contact a doctor if:  You are not sure if you are in labor or if your water has broken.  You are dizzy.  You have mild cramps or pressure in your lower belly.  You have a nagging pain in your belly area.  You continue to feel sick to your stomach, you throw up, or you have watery poop.  You have bad smelling fluid coming from your vagina.  You have pain when you pee. Get help right away if:  You have a fever.  You are leaking fluid from your vagina.  You are spotting or bleeding from your vagina.  You have severe belly cramps or pain.  You lose or gain weight quickly.  You have trouble catching your breath and have chest pain.  You notice sudden or extreme puffiness (swelling) of your face, hands, ankles, feet, or legs.  You have not felt the baby move in over an hour.  You have severe headaches that do not go away with medicine.  You have trouble seeing.  You are leaking, or you are having a gush of fluid, from your vagina before you are 37 weeks.  You have regular belly spasms (contractions) before you are 37 weeks. Summary  The third trimester is from week 28 through week 40 (months 7 through 9). This time is when your unborn baby is growing very fast.  Follow your doctor's advice about medicine, food, and activity.  Get ready for the arrival of your baby by taking prenatal classes, getting all the baby items ready, preparing the baby's room, and visiting your doctor to be checked.  Get help right away if you are bleeding from your vagina, or you have chest pain and trouble catching your breath, or if you have not felt your baby move in over an hour. This information is not intended to replace advice given to you by your health care provider. Make sure you discuss any questions you have with your health care provider. Document Released:  08/11/2009 Document Revised: 06/22/2016 Document Reviewed: 06/22/2016 Elsevier Interactive Patient Education  2019 Elsevier Inc. WHAT OB PATIENTS CAN EXPECT   Confirmation of pregnancy and ultrasound ordered if medically indicated-[redacted] weeks gestation  New OB (NOB) intake with nurse and New OB (NOB) labs- [redacted] weeks gestation  New OB (NOB) physical examination with provider- 11/[redacted] weeks gestation  Flu vaccine-[redacted] weeks gestation  Anatomy scan-[redacted] weeks gestation  Glucose tolerance test, blood work to test for anemia, T-dap vaccine-[redacted] weeks gestation  Vaginal swabs/cultures-STD/Group B strep-[redacted] weeks gestation  Appointments every 4 weeks until 28 weeks  Every 2 weeks from 28 weeks until 36 weeks  Weekly visits  from 36 weeks until delivery  Amoxicillin capsules or tablets What is this medicine? AMOXICILLIN (a mox i SIL in) is a penicillin antibiotic. It is used to treat certain kinds of bacterial infections. It will not work for colds, flu, or other viral infections. This medicine may be used for other purposes; ask your health care provider or pharmacist if you have questions. COMMON BRAND NAME(S): Amoxil, Moxilin, Sumox, Trimox What should I tell my health care provider before I take this medicine? They need to know if you have any of these conditions: -asthma -kidney disease -an unusual or allergic reaction to amoxicillin, other penicillins, cephalosporin antibiotics, other medicines, foods, dyes, or preservatives -pregnant or trying to get pregnant -breast-feeding How should I use this medicine? Take this medicine by mouth with a glass of water. Follow the directions on your prescription label. You may take this medicine with food or on an empty stomach. Take your medicine at regular intervals. Do not take your medicine more often than directed. Take all of your medicine as directed even if you think your are better. Do not skip doses or stop your medicine early. Talk to your  pediatrician regarding the use of this medicine in children. While this drug may be prescribed for selected conditions, precautions do apply. Overdosage: If you think you have taken too much of this medicine contact a poison control center or emergency room at once. NOTE: This medicine is only for you. Do not share this medicine with others. What if I miss a dose? If you miss a dose, take it as soon as you can. If it is almost time for your next dose, take only that dose. Do not take double or extra doses. What may interact with this medicine? -amiloride -birth control pills -chloramphenicol -macrolides -probenecid -sulfonamides -tetracyclines This list may not describe all possible interactions. Give your health care provider a list of all the medicines, herbs, non-prescription drugs, or dietary supplements you use. Also tell them if you smoke, drink alcohol, or use illegal drugs. Some items may interact with your medicine. What should I watch for while using this medicine? Tell your doctor or health care professional if your symptoms do not improve in 2 or 3 days. Take all of the doses of your medicine as directed. Do not skip doses or stop your medicine early. If you are diabetic, you may get a false positive result for sugar in your urine with certain brands of urine tests. Check with your doctor. Do not treat diarrhea with over-the-counter products. Contact your doctor if you have diarrhea that lasts more than 2 days or if the diarrhea is severe and watery. What side effects may I notice from receiving this medicine? Side effects that you should report to your doctor or health care professional as soon as possible: -allergic reactions like skin rash, itching or hives, swelling of the face, lips, or tongue -breathing problems -dark urine -redness, blistering, peeling or loosening of the skin, including inside the mouth -seizures -severe or watery diarrhea -trouble passing urine or  change in the amount of urine -unusual bleeding or bruising -unusually weak or tired -yellowing of the eyes or skin Side effects that usually do not require medical attention (report to your doctor or health care professional if they continue or are bothersome): -dizziness -headache -stomach upset -trouble sleeping This list may not describe all possible side effects. Call your doctor for medical advice about side effects. You may report side effects to FDA at 1-800-FDA-1088. Where  should I keep my medicine? Keep out of the reach of children. Store between 68 and 77 degrees F (20 and 25 degrees C). Keep bottle closed tightly. Throw away any unused medicine after the expiration date. NOTE: This sheet is a summary. It may not cover all possible information. If you have questions about this medicine, talk to your doctor, pharmacist, or health care provider.  2019 Elsevier/Gold Standard (2007-08-08 14:10:59) Nitrofurantoin tablets or capsules What is this medicine? NITROFURANTOIN (nye troe fyoor AN toyn) is an antibiotic. It is used to treat urinary tract infections. This medicine may be used for other purposes; ask your health care provider or pharmacist if you have questions. COMMON BRAND NAME(S): Macrobid, Macrodantin, Urotoin What should I tell my health care provider before I take this medicine? They need to know if you have any of these conditions: -anemia -diabetes -glucose-6-phosphate dehydrogenase deficiency -kidney disease -liver disease -lung disease -other chronic illness -an unusual or allergic reaction to nitrofurantoin, other antibiotics, other medicines, foods, dyes or preservatives -pregnant or trying to get pregnant -breast-feeding How should I use this medicine? Take this medicine by mouth with a glass of water. Follow the directions on the prescription label. Take this medicine with food or milk. Take your doses at regular intervals. Do not take your medicine more  often than directed. Do not stop taking except on your doctor's advice. Talk to your pediatrician regarding the use of this medicine in children. While this drug may be prescribed for selected conditions, precautions do apply. Overdosage: If you think you have taken too much of this medicine contact a poison control center or emergency room at once. NOTE: This medicine is only for you. Do not share this medicine with others. What if I miss a dose? If you miss a dose, take it as soon as you can. If it is almost time for your next dose, take only that dose. Do not take double or extra doses. What may interact with this medicine? -antacids containing magnesium trisilicate -probenecid -quinolone antibiotics like ciprofloxacin, lomefloxacin, norfloxacin and ofloxacin -sulfinpyrazone This list may not describe all possible interactions. Give your health care provider a list of all the medicines, herbs, non-prescription drugs, or dietary supplements you use. Also tell them if you smoke, drink alcohol, or use illegal drugs. Some items may interact with your medicine. What should I watch for while using this medicine? Tell your doctor or health care professional if your symptoms do not improve or if you get new symptoms. Drink several glasses of water a day. If you are taking this medicine for a long time, visit your doctor for regular checks on your progress. If you are diabetic, you may get a false positive result for sugar in your urine with certain brands of urine tests. Check with your doctor. What side effects may I notice from receiving this medicine? Side effects that you should report to your doctor or health care professional as soon as possible: -allergic reactions like skin rash or hives, swelling of the face, lips, or tongue -chest pain -cough -difficulty breathing -dizziness, drowsiness -fever or infection -joint aches or pains -pale or blue-tinted skin -redness, blistering, peeling or  loosening of the skin, including inside the mouth -tingling, burning, pain, or numbness in hands or feet -unusual bleeding or bruising -unusually weak or tired -yellowing of eyes or skin Side effects that usually do not require medical attention (report to your doctor or health care professional if they continue or are bothersome): -dark urine -  diarrhea -headache -loss of appetite -nausea or vomiting -temporary hair loss This list may not describe all possible side effects. Call your doctor for medical advice about side effects. You may report side effects to FDA at 1-800-FDA-1088. Where should I keep my medicine? Keep out of the reach of children. Store at room temperature between 15 and 30 degrees C (59 and 86 degrees F). Protect from light. Throw away any unused medicine after the expiration date. NOTE: This sheet is a summary. It may not cover all possible information. If you have questions about this medicine, talk to your doctor, pharmacist, or health care provider.  2019 Elsevier/Gold Standard (2007-12-06 15:56:47)

## 2018-08-08 NOTE — Progress Notes (Signed)
ROB-"Feeling amazing". Self removed stent yesterday. Will continue amoxicillin for four (4) more days per [facility] ID recommendations, see orders. Rx: Macrobid for prophylaxis, see orders. 28 week labs today. TDaP given. Blood transfusion consent reviewed and signed. Desires epidural. Plan breastfeeding. Anticipatory guidance regarding course of prenatal care. Reviewed red flag symptoms and when to call. RTC x 2 weeks for ROB or sooner if needed.

## 2018-08-09 LAB — CBC
Hematocrit: 26.4 % — ABNORMAL LOW (ref 34.0–46.6)
Hemoglobin: 9.1 g/dL — ABNORMAL LOW (ref 11.1–15.9)
MCH: 28.3 pg (ref 26.6–33.0)
MCHC: 34.5 g/dL (ref 31.5–35.7)
MCV: 82 fL (ref 79–97)
Platelets: 311 10*3/uL (ref 150–450)
RBC: 3.21 x10E6/uL — ABNORMAL LOW (ref 3.77–5.28)
RDW: 13.1 % (ref 11.7–15.4)
WBC: 8.4 10*3/uL (ref 3.4–10.8)

## 2018-08-09 LAB — RPR: RPR Ser Ql: NONREACTIVE

## 2018-08-09 LAB — GLUCOSE, 1 HOUR GESTATIONAL: Gestational Diabetes Screen: 116 mg/dL (ref 65–139)

## 2018-08-18 LAB — CALCULI, WITH PHOTOGRAPH (CLINICAL LAB)
Calcium Oxalate Dihydrate: 50 %
Calcium Oxalate Monohydrate: 30 %
Hydroxyapatite: 20 %
Weight Calculi: 58.1 mg

## 2018-08-21 ENCOUNTER — Telehealth: Payer: Self-pay | Admitting: Obstetrics and Gynecology

## 2018-08-21 ENCOUNTER — Encounter: Payer: Self-pay | Admitting: *Deleted

## 2018-08-21 NOTE — Telephone Encounter (Signed)
Sent pt mychart message

## 2018-08-21 NOTE — Telephone Encounter (Signed)
The patient called and stated that she has a few questions and concerns in regards to her needing to be seen due to her baby being breeched. The patient was rescheduled out to 36 wks due to COVID-19.

## 2018-08-22 ENCOUNTER — Encounter: Payer: 59 | Admitting: Certified Nurse Midwife

## 2018-08-22 ENCOUNTER — Telehealth: Payer: Self-pay | Admitting: Family Medicine

## 2018-08-22 NOTE — Telephone Encounter (Signed)
Verified visible to patient on MyChart

## 2018-08-22 NOTE — Telephone Encounter (Signed)
Please clarify what the analysis says

## 2018-08-22 NOTE — Telephone Encounter (Signed)
Read her the results of stone composition (percents).  You can make sure she has access to it on mychart.    [name], MD

## 2018-08-22 NOTE — Telephone Encounter (Signed)
-----   Message from [name], MD sent at 08/21/2018 10:12 AM EDT ----- Please share stone analysis results with patient.  [name], MD

## 2018-08-23 ENCOUNTER — Encounter: Payer: Self-pay | Admitting: Obstetrics and Gynecology

## 2018-08-23 LAB — ANAEROBIC CULTURE: Gram Stain: NONE SEEN

## 2018-08-28 ENCOUNTER — Other Ambulatory Visit: Payer: Self-pay

## 2018-08-28 ENCOUNTER — Other Ambulatory Visit: Payer: 59

## 2018-08-28 DIAGNOSIS — N200 Calculus of kidney: Secondary | ICD-10-CM

## 2018-08-30 LAB — URINE CULTURE

## 2018-09-11 ENCOUNTER — Telehealth: Payer: Self-pay | Admitting: *Deleted

## 2018-09-11 NOTE — Telephone Encounter (Signed)
Coronavirus (COVID-19) Are you at risk?  Are you at risk for the Coronavirus (COVID-19)?  To be considered HIGH RISK for Coronavirus (COVID-19), you have to meet the following criteria:  . Traveled to [place], [place], [place], [place] or [place]; or in the [place] to [place], [place], [place], or [place]; and have fever, cough, and shortness of breath within the last 2 weeks of travel OR . Been in close contact with a person diagnosed with COVID-19 within the last 2 weeks and have fever, cough, and shortness of breath . IF YOU DO NOT MEET THESE CRITERIA, YOU ARE CONSIDERED LOW RISK FOR COVID-19.  What to do if you are HIGH RISK for COVID-19?  . If you are having a medical emergency, call 911. . Seek medical care right away. Before you go to a doctor's office, urgent care or emergency department, call ahead and tell them about your recent travel, contact with someone diagnosed with COVID-19, and your symptoms. You should receive instructions from your physician's office regarding next steps of care.  . When you arrive at healthcare provider, tell the healthcare staff immediately you have returned from visiting [place], [place], [place], [place] or [place]; or traveled in the [place] to [place], [place], [place], or [place]; in the last two weeks or you have been in close contact with a person diagnosed with COVID-19 in the last 2 weeks.   . Tell the health care staff about your symptoms: fever, cough and shortness of breath. . After you have been seen by a medical provider, you will be either: o Tested for (COVID-19) and discharged home on quarantine except to seek medical care if symptoms worsen, and asked to  - Stay home and avoid contact with others until you get your results (4-5 days)  - Avoid travel on public transportation if possible (such as bus, train, or airplane) or o Sent to the Emergency Department by EMS for evaluation, COVID-19 testing, and possible  admission depending on your condition and test results.  What to do if you are LOW RISK for COVID-19?  Reduce your risk of any infection by using the same precautions used for avoiding the common cold or flu:  . Wash your hands often with soap and warm water for at least 20 seconds.  If soap and water are not readily available, use an alcohol-based hand sanitizer with at least 60% alcohol.  . If coughing or sneezing, cover your mouth and nose by coughing or sneezing into the elbow areas of your shirt or coat, into a tissue or into your sleeve (not your hands). . Avoid shaking hands with others and consider head nods or verbal greetings only. . Avoid touching your eyes, nose, or mouth with unwashed hands.  . Avoid close contact with people who are sick. . Avoid places or events with large numbers of people in one location, like concerts or sporting events. . Carefully consider travel plans you have or are making. . If you are planning any travel outside or inside the [place], visit the CDC's Travelers' Health webpage for the latest health notices. . If you have some symptoms but not all symptoms, continue to monitor at home and seek medical attention if your symptoms worsen. . If you are having a medical emergency, call 911.   ADDITIONAL HEALTHCARE OPTIONS FOR PATIENTS  [facility] / e-Visit: [email or web address]         [facility]: [phone]  [facility]: [phone]                   [facility]: [phone]   Spoke with pt denies any sx.  Bahja Bence, CMA 

## 2018-09-12 ENCOUNTER — Ambulatory Visit (INDEPENDENT_AMBULATORY_CARE_PROVIDER_SITE_OTHER): Payer: 59 | Admitting: Obstetrics and Gynecology

## 2018-09-12 ENCOUNTER — Other Ambulatory Visit: Payer: Self-pay

## 2018-09-12 VITALS — BP 113/69 | HR 89 | Wt 187.4 lb

## 2018-09-12 DIAGNOSIS — O26899 Other specified pregnancy related conditions, unspecified trimester: Secondary | ICD-10-CM

## 2018-09-12 DIAGNOSIS — R12 Heartburn: Secondary | ICD-10-CM

## 2018-09-12 DIAGNOSIS — N200 Calculus of kidney: Secondary | ICD-10-CM

## 2018-09-12 DIAGNOSIS — Z3493 Encounter for supervision of normal pregnancy, unspecified, third trimester: Secondary | ICD-10-CM

## 2018-09-12 DIAGNOSIS — O26893 Other specified pregnancy related conditions, third trimester: Secondary | ICD-10-CM

## 2018-09-12 DIAGNOSIS — R399 Unspecified symptoms and signs involving the genitourinary system: Secondary | ICD-10-CM

## 2018-09-12 LAB — POCT URINALYSIS DIPSTICK OB
Bilirubin, UA: NEGATIVE
Blood, UA: NEGATIVE
Glucose, UA: NEGATIVE
Ketones, UA: NEGATIVE
Leukocytes, UA: NEGATIVE
Nitrite, UA: NEGATIVE
POC,PROTEIN,UA: NEGATIVE
Spec Grav, UA: 1.02 (ref 1.010–1.025)
Urobilinogen, UA: 0.2 E.U./dL
pH, UA: 6.5 (ref 5.0–8.0)

## 2018-09-12 NOTE — Patient Instructions (Signed)
Heartburn During Pregnancy  Heartburn is a type of pain or discomfort that can happen in the throat or chest. It is often described as a burning sensation. Heartburn is common during pregnancy because:  · A hormone (progesterone) that is released during pregnancy may relax the valve (lower esophageal sphincter, or LES) that separates the esophagus from the stomach. This allows stomach acid to move up into the esophagus, causing heartburn.  · The uterus gets larger and pushes up on the stomach, which pushes more acid into the esophagus. This is especially true in the later stages of pregnancy.  Heartburn usually goes away or gets better after giving birth.  What are the causes?  Heartburn is caused by stomach acid backing up into the esophagus (reflux). Reflux can be triggered by:  · Changing hormone levels.  · Large meals.  · Certain foods and beverages, such as coffee, chocolate, onions, and peppermint.  · Exercise.  · Increased stomach acid production.  What increases the risk?  You are more likely to experience heartburn during pregnancy if you:  · Had heartburn prior to becoming pregnant.  · Have been pregnant more than once before.  · Are overweight or obese.  The likelihood that you will get heartburn also increases as you get farther along in your pregnancy, especially during the last trimester.  What are the signs or symptoms?  Symptoms of this condition include:  · Burning pain in the chest or lower throat.  · Bitter taste in the mouth.  · Coughing.  · Problems swallowing.  · Vomiting.  · Hoarse voice.  · Asthma.  Symptoms may get worse when you lie down or bend over. Symptoms are often worse at night.  How is this diagnosed?  This condition is diagnosed based on:  · Your medical history.  · Your symptoms.  · Blood tests to check for a certain type of bacteria associated with heartburn.  · Whether taking heartburn medicine relieves your symptoms.  · Examination of the stomach and esophagus using a tube with  a light and camera on the end (endoscopy).  How is this treated?  Treatment varies depending on how severe your symptoms are. Your health care provider may recommend:  · Over-the-counter medicines (antacids or acid reducers) for mild heartburn.  · Prescription medicines to decrease stomach acid or to protect your stomach lining.  · Certain changes in your diet.  · Raising the head of your bed so it is higher than the foot of the bed. This helps prevent stomach acid from backing up into the esophagus when you are lying down.  Follow these instructions at home:  Eating and drinking  · Do not drink alcohol during your pregnancy.  · Identify foods and beverages that make your symptoms worse, and avoid them.  · Beverages that you may want to avoid include:  ? Coffee and tea (with or without caffeine).  ? Energy drinks and sports drinks.  ? Carbonated drinks or sodas.  ? Citrus fruit juices.  · Foods that you may want to avoid include:  ? Chocolate and cocoa.  ? Peppermint and mint flavorings.  ? Garlic, onions, and horseradish.  ? Spicy and acidic foods, including peppers, chili powder, curry powder, vinegar, hot sauces, and barbecue sauce.  ? Citrus fruits, such as oranges, lemons, and limes.  ? Tomato-based foods, such as red sauce, chili, and salsa.  ? Fried and fatty foods, such as donuts, french fries, potato chips, and high-fat dressings.  ?   High-fat meats, such as hot dogs, cold cuts, sausage, ham, and bacon.  ? High-fat dairy items, such as whole milk, butter, and cheese.  · Eat small, frequent meals instead of large meals.  · Avoid drinking large amounts of liquid with your meals.  · Avoid eating meals during the 2-3 hours before bedtime.  · Avoid lying down right after you eat.  · Do not exercise right after you eat.  Medicines  · Take over-the-counter and prescription medicines only as told by your health care provider.  · Do not take aspirin, ibuprofen, or other NSAIDs unless your health care provider tells  you to do that.  · You may be instructed to avoid medicines that contain sodium bicarbonate.  General instructions    · If directed, raise the head of your bed about 6 inches (15 cm) by putting blocks under the legs. Sleeping with more pillows does not effectively relieve heartburn because it only changes the position of your head.  · Do not use any products that contain nicotine or tobacco, such as cigarettes and e-cigarettes. If you need help quitting, ask your health care provider.  · Wear loose-fitting clothing.  · Try to reduce your stress, such as with yoga or meditation. If you need help managing stress, ask your health care provider.  · Maintain a healthy weight. If you are overweight, work with your health care provider to safely lose weight.  · Keep all follow-up visits as told by your health care provider. This is important.  Contact a health care provider if:  · You develop new symptoms.  · Your symptoms do not improve with treatment.  · You have unexplained weight loss.  · You have difficulty swallowing.  · You make loud sounds when you breathe (wheeze).  · You have a cough that does not go away.  · You have frequent heartburn for more than 2 weeks.  · You have nausea or vomiting that does not get better with treatment.  · You have pain in your abdomen.  Get help right away if:  · You have severe chest pain that spreads to your arm, neck, or jaw.  · You feel sweaty, dizzy, or light-headed.  · You have shortness of breath.  · You have pain when swallowing.  · You vomit, and your vomit looks like blood or coffee grounds.  · Your stool is bloody or black.  This information is not intended to replace advice given to you by your health care provider. Make sure you discuss any questions you have with your health care provider.  Document Released: 05/14/2000 Document Revised: 02/02/2016 Document Reviewed: 02/02/2016  Elsevier Interactive Patient Education © 2019 [employer]

## 2018-09-12 NOTE — Progress Notes (Signed)
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 Also reports dizzy spells while standing last week,  Had to sit down to keep from passing out.  Indigestion is worse and Tums helps some, is waking her at night. Baby is moving well, and feels lower, discussed sleeping and tylenol PM,

## 2018-09-12 NOTE — Addendum Note (Signed)
Addended by: Raliegh Ip on: 09/12/2018 09:55 AM   Modules accepted: Orders

## 2018-09-14 LAB — URINE CULTURE

## 2018-09-25 ENCOUNTER — Telehealth: Payer: Self-pay

## 2018-09-25 NOTE — Telephone Encounter (Signed)
Coronavirus (COVID-19) Are you at risk?  Are you at risk for the Coronavirus (COVID-19)?  To be considered HIGH RISK for Coronavirus (COVID-19), you have to meet the following criteria:  . Traveled to [place], [place], [place], [place] or [place]; or in the [place] to [place], [place], [place], or [place]; and have fever, cough, and shortness of breath within the last 2 weeks of travel OR . Been in close contact with a person diagnosed with COVID-19 within the last 2 weeks and have fever, cough, and shortness of breath . IF YOU DO NOT MEET THESE CRITERIA, YOU ARE CONSIDERED LOW RISK FOR COVID-19.  What to do if you are HIGH RISK for COVID-19?  Marland Kitchen If you are having a medical emergency, call 911. . Seek medical care right away. Before you go to a doctor's office, urgent care or emergency department, call ahead and tell them about your recent travel, contact with someone diagnosed with COVID-19, and your symptoms. You should receive instructions from your physician's office regarding next steps of care.  . When you arrive at healthcare provider, tell the healthcare staff immediately you have returned from visiting [place], [place], [place], [place] or [place]; or traveled in the [place] to [place], [place], [place], or [place]; in the last two weeks or you have been in close contact with a person diagnosed with COVID-19 in the last 2 weeks.   . Tell the health care staff about your symptoms: fever, cough and shortness of breath. . After you have been seen by a medical provider, you will be either: o Tested for (COVID-19) and discharged home on quarantine except to seek medical care if symptoms worsen, and asked to  - Stay home and avoid contact with others until you get your results (4-5 days)  - Avoid travel on public transportation if possible (such as bus, train, or airplane) or o Sent to the Emergency Department by EMS for evaluation, COVID-19 testing, and possible  admission depending on your condition and test results.  What to do if you are LOW RISK for COVID-19?  Reduce your risk of any infection by using the same precautions used for avoiding the common cold or flu:  Marland Kitchen Wash your hands often with soap and warm water for at least 20 seconds.  If soap and water are not readily available, use an alcohol-based hand sanitizer with at least 60% alcohol.  . If coughing or sneezing, cover your mouth and nose by coughing or sneezing into the elbow areas of your shirt or coat, into a tissue or into your sleeve (not your hands). . Avoid shaking hands with others and consider head nods or verbal greetings only. . Avoid touching your eyes, nose, or mouth with unwashed hands.  . Avoid close contact with people who are Lucca Greggs. . Avoid places or events with large numbers of people in one location, like concerts or sporting events. . Carefully consider travel plans you have or are making. . If you are planning any travel outside or inside the [place], visit the CDC's Travelers' Health webpage for the latest health notices. . If you have some symptoms but not all symptoms, continue to monitor at home and seek medical attention if your symptoms worsen. . If you are having a medical emergency, call 911.  09/25/18 SCREENING NEG SLS ADDITIONAL HEALTHCARE OPTIONS FOR PATIENTS  [facility] / e-Visit: [email or web address]         [facility]: [phone]  [facility]: [phone]                   [facility]: [phone]  

## 2018-09-26 ENCOUNTER — Other Ambulatory Visit: Payer: Self-pay

## 2018-09-26 ENCOUNTER — Ambulatory Visit (INDEPENDENT_AMBULATORY_CARE_PROVIDER_SITE_OTHER): Payer: 59 | Admitting: Obstetrics and Gynecology

## 2018-09-26 VITALS — BP 126/80 | HR 90 | Wt 191.9 lb

## 2018-09-26 DIAGNOSIS — Z113 Encounter for screening for infections with a predominantly sexual mode of transmission: Secondary | ICD-10-CM

## 2018-09-26 DIAGNOSIS — Z3685 Encounter for antenatal screening for Streptococcus B: Secondary | ICD-10-CM

## 2018-09-26 DIAGNOSIS — Z3493 Encounter for supervision of normal pregnancy, unspecified, third trimester: Secondary | ICD-10-CM

## 2018-09-26 LAB — POCT URINALYSIS DIPSTICK OB
Bilirubin, UA: NEGATIVE
Glucose, UA: NEGATIVE
Ketones, UA: NEGATIVE
Nitrite, UA: NEGATIVE
POC,PROTEIN,UA: NEGATIVE
Spec Grav, UA: 1.015 (ref 1.010–1.025)
Urobilinogen, UA: 0.2 E.U./dL
pH, UA: 7.5 (ref 5.0–8.0)

## 2018-09-26 NOTE — Progress Notes (Signed)
ROB- cultures obtained, pelvic pressure,

## 2018-09-26 NOTE — Progress Notes (Signed)
ROB- cultures obtained- stopped antibiotics herself last week. Unsure on [facility] had Mirena in past but spouse could feel it. Will consider.  Discussed IOL near due date if not delivered by then.

## 2018-09-26 NOTE — Addendum Note (Signed)
Addended by: Keturah Barre L on: 09/26/2018 10:21 AM   Modules accepted: Orders

## 2018-09-28 LAB — URINE CULTURE

## 2018-09-28 LAB — STREP GP B NAA: Strep Gp B NAA: NEGATIVE

## 2018-09-29 LAB — GC/CHLAMYDIA PROBE AMP
Chlamydia trachomatis, NAA: NEGATIVE
Neisseria Gonorrhoeae by PCR: NEGATIVE

## 2018-10-04 ENCOUNTER — Telehealth: Payer: Self-pay

## 2018-10-04 NOTE — Telephone Encounter (Signed)
Coronavirus (COVID-19) Are you at risk?  Are you at risk for the Coronavirus (COVID-19)?  To be considered HIGH RISK for Coronavirus (COVID-19), you have to meet the following criteria:  . Traveled to [place], [place], [place], [place] or [place]; or in the [place] to [place], [place], [place], or [place]; and have fever, cough, and shortness of breath within the last 2 weeks of travel OR . Been in close contact with a person diagnosed with COVID-19 within the last 2 weeks and have fever, cough, and shortness of breath . IF YOU DO NOT MEET THESE CRITERIA, YOU ARE CONSIDERED LOW RISK FOR COVID-19.  What to do if you are HIGH RISK for COVID-19?  . If you are having a medical emergency, call 911. . Seek medical care right away. Before you go to a doctor's office, urgent care or emergency department, call ahead and tell them about your recent travel, contact with someone diagnosed with COVID-19, and your symptoms. You should receive instructions from your physician's office regarding next steps of care.  . When you arrive at healthcare provider, tell the healthcare staff immediately you have returned from visiting [place], [place], [place], [place] or [place]; or traveled in the [place] to [place], [place], [place], or [place]; in the last two weeks or you have been in close contact with a person diagnosed with COVID-19 in the last 2 weeks.   . Tell the health care staff about your symptoms: fever, cough and shortness of breath. . After you have been seen by a medical provider, you will be either: o Tested for (COVID-19) and discharged home on quarantine except to seek medical care if symptoms worsen, and asked to  - Stay home and avoid contact with others until you get your results (4-5 days)  - Avoid travel on public transportation if possible (such as bus, train, or airplane) or o Sent to the Emergency Department by EMS for evaluation, COVID-19 testing, and possible  admission depending on your condition and test results.  What to do if you are LOW RISK for COVID-19?  Reduce your risk of any infection by using the same precautions used for avoiding the common cold or flu:  . Wash your hands often with soap and warm water for at least 20 seconds.  If soap and water are not readily available, use an alcohol-based hand sanitizer with at least 60% alcohol.  . If coughing or sneezing, cover your mouth and nose by coughing or sneezing into the elbow areas of your shirt or coat, into a tissue or into your sleeve (not your hands). . Avoid shaking hands with others and consider head nods or verbal greetings only. . Avoid touching your eyes, nose, or mouth with unwashed hands.  . Avoid close contact with people who are sick. . Avoid places or events with large numbers of people in one location, like concerts or sporting events. . Carefully consider travel plans you have or are making. . If you are planning any travel outside or inside the [place], visit the CDC's Travelers' Health webpage for the latest health notices. . If you have some symptoms but not all symptoms, continue to monitor at home and seek medical attention if your symptoms worsen. . If you are having a medical emergency, call 911.   ADDITIONAL HEALTHCARE OPTIONS FOR PATIENTS  [facility] / e-Visit: [email or web address]         [facility]: [phone]  [facility]: [phone]                   [facility]: [phone]   Pre-screen negative, DM.   

## 2018-10-05 ENCOUNTER — Other Ambulatory Visit: Payer: Self-pay

## 2018-10-05 ENCOUNTER — Ambulatory Visit (INDEPENDENT_AMBULATORY_CARE_PROVIDER_SITE_OTHER): Payer: 59 | Admitting: Obstetrics and Gynecology

## 2018-10-05 VITALS — BP 130/64 | HR 90 | Wt 193.1 lb

## 2018-10-05 DIAGNOSIS — Z3493 Encounter for supervision of normal pregnancy, unspecified, third trimester: Secondary | ICD-10-CM

## 2018-10-05 LAB — POCT URINALYSIS DIPSTICK OB
Bilirubin, UA: NEGATIVE
Blood, UA: NEGATIVE
Glucose, UA: NEGATIVE
Ketones, UA: NEGATIVE
Nitrite, UA: NEGATIVE
POC,PROTEIN,UA: NEGATIVE
Spec Grav, UA: 1.01 (ref 1.010–1.025)
Urobilinogen, UA: 0.2 E.U./dL
pH, UA: 6.5 (ref 5.0–8.0)

## 2018-10-05 NOTE — Progress Notes (Signed)
ROB- discussed labor precautions, encouraged to continue antibiotic suppression and urine sent for culture.will plan IOL on 5/20 or 5/21 if not delivered by then, was induced at 41 weeks last pregnancy.

## 2018-10-05 NOTE — Progress Notes (Signed)
ROB- pt is having a lot of pelvic pressure, hurting in her lower back

## 2018-10-11 ENCOUNTER — Telehealth: Payer: Self-pay | Admitting: *Deleted

## 2018-10-11 NOTE — Telephone Encounter (Signed)
Coronavirus (COVID-19) Are you at risk?  Are you at risk for the Coronavirus (COVID-19)?  To be considered HIGH RISK for Coronavirus (COVID-19), you have to meet the following criteria:  . Traveled to [place], [place], [place], [place] or [place]; or in the [place] to [place], [place], [place], or [place]; and have fever, cough, and shortness of breath within the last 2 weeks of travel OR . Been in close contact with a person diagnosed with COVID-19 within the last 2 weeks and have fever, cough, and shortness of breath . IF YOU DO NOT MEET THESE CRITERIA, YOU ARE CONSIDERED LOW RISK FOR COVID-19.  What to do if you are HIGH RISK for COVID-19?  . If you are having a medical emergency, call 911. . Seek medical care right away. Before you go to a doctor's office, urgent care or emergency department, call ahead and tell them about your recent travel, contact with someone diagnosed with COVID-19, and your symptoms. You should receive instructions from your physician's office regarding next steps of care.  . When you arrive at healthcare provider, tell the healthcare staff immediately you have returned from visiting [place], [place], [place], [place] or [place]; or traveled in the [place] to [place], [place], [place], or [place]; in the last two weeks or you have been in close contact with a person diagnosed with COVID-19 in the last 2 weeks.   . Tell the health care staff about your symptoms: fever, cough and shortness of breath. . After you have been seen by a medical provider, you will be either: o Tested for (COVID-19) and discharged home on quarantine except to seek medical care if symptoms worsen, and asked to  - Stay home and avoid contact with others until you get your results (4-5 days)  - Avoid travel on public transportation if possible (such as bus, train, or airplane) or o Sent to the Emergency Department by EMS for evaluation, COVID-19 testing, and possible  admission depending on your condition and test results.  What to do if you are LOW RISK for COVID-19?  Reduce your risk of any infection by using the same precautions used for avoiding the common cold or flu:  . Wash your hands often with soap and warm water for at least 20 seconds.  If soap and water are not readily available, use an alcohol-based hand sanitizer with at least 60% alcohol.  . If coughing or sneezing, cover your mouth and nose by coughing or sneezing into the elbow areas of your shirt or coat, into a tissue or into your sleeve (not your hands). . Avoid shaking hands with others and consider head nods or verbal greetings only. . Avoid touching your eyes, nose, or mouth with unwashed hands.  . Avoid close contact with people who are sick. . Avoid places or events with large numbers of people in one location, like concerts or sporting events. . Carefully consider travel plans you have or are making. . If you are planning any travel outside or inside the [place], visit the CDC's Travelers' Health webpage for the latest health notices. . If you have some symptoms but not all symptoms, continue to monitor at home and seek medical attention if your symptoms worsen. . If you are having a medical emergency, call 911.   ADDITIONAL HEALTHCARE OPTIONS FOR PATIENTS  [facility] / e-Visit: [email or web address]         [facility]: [phone]  [facility]: [phone]                   [facility]: [phone]   Spoke with pt denies any sx.  Datrell Dunton, CMA 

## 2018-10-12 ENCOUNTER — Ambulatory Visit (INDEPENDENT_AMBULATORY_CARE_PROVIDER_SITE_OTHER): Payer: 59 | Admitting: Certified Nurse Midwife

## 2018-10-12 ENCOUNTER — Other Ambulatory Visit: Payer: Self-pay

## 2018-10-12 VITALS — BP 120/79 | HR 86 | Wt 195.8 lb

## 2018-10-12 DIAGNOSIS — Z3493 Encounter for supervision of normal pregnancy, unspecified, third trimester: Secondary | ICD-10-CM

## 2018-10-12 DIAGNOSIS — Z3A38 38 weeks gestation of pregnancy: Secondary | ICD-10-CM

## 2018-10-12 LAB — POCT URINALYSIS DIPSTICK OB
Bilirubin, UA: NEGATIVE
Glucose, UA: NEGATIVE
Ketones, UA: NEGATIVE
Nitrite, UA: NEGATIVE
Spec Grav, UA: 1.01 (ref 1.010–1.025)
Urobilinogen, UA: 0.2 E.U./dL
pH, UA: 6 (ref 5.0–8.0)

## 2018-10-12 NOTE — Progress Notes (Signed)
ROB-Doing well, no questions or concerns. Request SVE, unchanged from previous visit. Discussed COVID testing prior to scheduled IOL, handout given. Reviewed red flag symptoms and when to call. Report to [facility] for IOL as scheduled or sooner if needed. Preadmit testing order placed.

## 2018-10-12 NOTE — Progress Notes (Signed)
ROB-No complaints.

## 2018-10-12 NOTE — Patient Instructions (Signed)
Fetal Movement Counts Patient Name: ________________________________________________ Patient Due Date: ____________________ What is a fetal movement count?  A fetal movement count is the number of times that you feel your baby move during a certain amount of time. This may also be called a fetal kick count. A fetal movement count is recommended for every pregnant woman. You may be asked to start counting fetal movements as early as week 28 of your pregnancy. Pay attention to when your baby is most active. You may notice your baby's sleep and wake cycles. You may also notice things that make your baby move more. You should do a fetal movement count:  When your baby is normally most active.  At the same time each day. A good time to count movements is while you are resting, after having something to eat and drink. How do I count fetal movements? 1. Find a quiet, comfortable area. Sit, or lie down on your side. 2. Write down the date, the start time and stop time, and the number of movements that you felt between those two times. Take this information with you to your health care visits. 3. For 2 hours, count kicks, flutters, swishes, rolls, and jabs. You should feel at least 10 movements during 2 hours. 4. You may stop counting after you have felt 10 movements. 5. If you do not feel 10 movements in 2 hours, have something to eat and drink. Then, keep resting and counting for 1 hour. If you feel at least 4 movements during that hour, you may stop counting. Contact a health care provider if:  You feel fewer than 4 movements in 2 hours.  Your baby is not moving like he or she usually does. Date: ____________ Start time: ____________ Stop time: ____________ Movements: ____________ Date: ____________ Start time: ____________ Stop time: ____________ Movements: ____________ Date: ____________ Start time: ____________ Stop time: ____________ Movements: ____________ Date: ____________ Start time:  ____________ Stop time: ____________ Movements: ____________ Date: ____________ Start time: ____________ Stop time: ____________ Movements: ____________ Date: ____________ Start time: ____________ Stop time: ____________ Movements: ____________ Date: ____________ Start time: ____________ Stop time: ____________ Movements: ____________ Date: ____________ Start time: ____________ Stop time: ____________ Movements: ____________ Date: ____________ Start time: ____________ Stop time: ____________ Movements: ____________ This information is not intended to replace advice given to you by your health care provider. Make sure you discuss any questions you have with your health care provider. Document Released: 06/16/2006 Document Revised: 01/14/2016 Document Reviewed: 06/26/2015 Elsevier Interactive Patient Education  2019 [employer]. Augmentation of Labor  Augmentation of labor is when steps are taken to stimulate and strengthen contractions of the uterus during labor. This may be done when contractions have slowed down or stopped, delaying progress of labor and delivery of the baby. Before beginning augmentation of labor, your health care provider will evaluate your condition, your baby's condition, the size and position of your baby, and the size of your birth canal. What are some reasons for labor augmentation? Augmentation of labor may be needed when:  You are in labor but your contractions are weak or irregular.  You are in labor but your contractions have stopped. What methods are used for labor augmentation? Labor augmentation may be done by:  Giving medicine that stimulates contractions (oxytocin). This is given through an IV tube that is inserted into one of your veins.  Breaking the fluid-filled sac that surrounds the fetus (amniotic sac). What are the risks associated with labor augmentation? Some risks of labor augmentation include:  Too much stimulation of the contractions,  resulting in continuous, prolonged, or very strong contractions.  Increased risk of infection for you and your baby.  Tearing (rupture) of the uterus.  Breaking off (abruption) of the placenta.  Increased risk of cesarean, forceps, or vacuum delivery.  Excessive bleeding after delivery (postpartum hemorrhage).  Death of the baby (fetal death). What are some reasons for not doing labor augmentation? Augmentation of labor should not be done if:  The baby is too big for the birth canal. This can be confirmed with an ultrasound.  The umbilical cord drops in front of the baby's head or breech part (prolapsed cord).  You have had a cesarean delivery and you had a vertical incision or you do not know what type of incision you had.  You have had surgery on or into your uterus.  You have an active herpes outbreak.  You have cervical cancer.  The placenta blocks the opening of the cervix (placenta previa) or you have other condition that is blocking the cervix or vaginal outlet.  The baby is lying sideways.  Your pelvis is will not permit the passage of the baby.  You are carrying more than two babies. Summary  Augmentation of labor is when steps are taken to stimulate and strengthen contractions of the uterus during labor. This may be done when contractions have slowed down or stopped, delaying progress of labor and delivery of the baby.  Labor augmentation may be done using medicine to stimulate contractions (oxytocin) or by breaking the fluid-filled sac that surrounds the fetus (amniotic sac).  Labor should not be augmented if you have had a cesarean delivery and you had a vertical incision or you do not know what type of incision you had. This information is not intended to replace advice given to you by your health care provider. Make sure you discuss any questions you have with your health care provider. Document Released: 11/09/2006 Document Revised: 06/21/2016 Document  Reviewed: 06/21/2016 Elsevier Interactive Patient Education  2019 [employer].

## 2018-10-16 ENCOUNTER — Other Ambulatory Visit: Payer: Self-pay

## 2018-10-16 ENCOUNTER — Ambulatory Visit
Admission: RE | Admit: 2018-10-16 | Discharge: 2018-10-16 | Disposition: A | Discharge status: Home / Self Care | Payer: 59 | Source: Ambulatory Visit | Attending: Obstetrics and Gynecology | Admitting: Obstetrics and Gynecology

## 2018-10-16 DIAGNOSIS — Z1159 Encounter for screening for other viral diseases: Secondary | ICD-10-CM | POA: Insufficient documentation

## 2018-10-17 ENCOUNTER — Other Ambulatory Visit: Payer: Self-pay | Admitting: Obstetrics and Gynecology

## 2018-10-17 LAB — NOVEL CORONAVIRUS, NAA (HOSP ORDER, SEND-OUT TO REF LAB; TAT 18-24 HRS): SARS-CoV-2, NAA: NOT DETECTED

## 2018-10-18 ENCOUNTER — Inpatient Hospital Stay: Payer: Medicaid Other | Admitting: Anesthesiology

## 2018-10-18 ENCOUNTER — Other Ambulatory Visit: Payer: Self-pay

## 2018-10-18 ENCOUNTER — Inpatient Hospital Stay
Admission: EM | Admit: 2018-10-18 | Discharge: 2018-10-19 | DRG: 807 | Disposition: A | Discharge status: Home / Self Care | Payer: Medicaid Other | Attending: Obstetrics and Gynecology | Admitting: Obstetrics and Gynecology

## 2018-10-18 DIAGNOSIS — O99824 Streptococcus B carrier state complicating childbirth: Secondary | ICD-10-CM

## 2018-10-18 DIAGNOSIS — Z3A39 39 weeks gestation of pregnancy: Secondary | ICD-10-CM

## 2018-10-18 DIAGNOSIS — O9081 Anemia of the puerperium: Secondary | ICD-10-CM | POA: Diagnosis not present

## 2018-10-18 DIAGNOSIS — D509 Iron deficiency anemia, unspecified: Secondary | ICD-10-CM | POA: Diagnosis present

## 2018-10-18 DIAGNOSIS — O26893 Other specified pregnancy related conditions, third trimester: Secondary | ICD-10-CM | POA: Diagnosis present

## 2018-10-18 DIAGNOSIS — Z3490 Encounter for supervision of normal pregnancy, unspecified, unspecified trimester: Secondary | ICD-10-CM

## 2018-10-18 LAB — CBC
HCT: 23.5 % — ABNORMAL LOW (ref 36.0–46.0)
Hemoglobin: 7.5 g/dL — ABNORMAL LOW (ref 12.0–15.0)
MCH: 24 pg — ABNORMAL LOW (ref 26.0–34.0)
MCHC: 31.9 g/dL (ref 30.0–36.0)
MCV: 75.3 fL — ABNORMAL LOW (ref 80.0–100.0)
Platelets: 225 10*3/uL (ref 150–400)
RBC: 3.12 MIL/uL — ABNORMAL LOW (ref 3.87–5.11)
RDW: 15.2 % (ref 11.5–15.5)
WBC: 7.9 10*3/uL (ref 4.0–10.5)
nRBC: 0 % (ref 0.0–0.2)

## 2018-10-18 LAB — TYPE AND SCREEN
ABO/RH(D): A POS
Antibody Screen: NEGATIVE

## 2018-10-18 MED ORDER — OXYCODONE-ACETAMINOPHEN 5-325 MG PO TABS: 2.0000 | ORAL_TABLET | ORAL | Status: DC | PRN

## 2018-10-18 MED ORDER — COCONUT OIL OIL
1.0000 "application " | TOPICAL_OIL | Status: DC | PRN
  Administered 2018-10-19: 1 via TOPICAL
  Filled 2018-10-18: qty 120

## 2018-10-18 MED ORDER — OXYTOCIN 40 UNITS IN NORMAL SALINE INFUSION - SIMPLE MED
1.0000 m[IU]/min | INTRAVENOUS | Status: DC
  Administered 2018-10-18: 2 m[IU]/min via INTRAVENOUS

## 2018-10-18 MED ORDER — SODIUM CHLORIDE 0.9% FLUSH: 3.0000 mL | INTRAVENOUS | Status: DC | PRN

## 2018-10-18 MED ORDER — CALCIUM CARBONATE ANTACID 500 MG PO CHEW
CHEWABLE_TABLET | ORAL | Status: AC
  Administered 2018-10-18: 200 mg via ORAL
  Filled 2018-10-18: qty 1

## 2018-10-18 MED ORDER — FERROUS SULFATE 325 (65 FE) MG PO TABS
325.0000 mg | ORAL_TABLET | Freq: Two times a day (BID) | ORAL | Status: DC
  Administered 2018-10-18 – 2018-10-19 (×3): 325 mg via ORAL
  Filled 2018-10-18 (×3): qty 1

## 2018-10-18 MED ORDER — ZOLPIDEM TARTRATE 5 MG PO TABS
ORAL_TABLET | ORAL | Status: AC
  Administered 2018-10-18: 5 mg via ORAL
  Filled 2018-10-18: qty 1

## 2018-10-18 MED ORDER — DIBUCAINE (PERIANAL) 1 % EX OINT: 1.0000 "application " | TOPICAL_OINTMENT | CUTANEOUS | Status: DC | PRN

## 2018-10-18 MED ORDER — NITROFURANTOIN MONOHYD MACRO 100 MG PO CAPS
100.0000 mg | ORAL_CAPSULE | Freq: Two times a day (BID) | ORAL | Status: DC
  Administered 2018-10-18 – 2018-10-19 (×2): 100 mg via ORAL
  Filled 2018-10-18 (×6): qty 1

## 2018-10-18 MED ORDER — ACETAMINOPHEN 325 MG PO TABS: 650.0000 mg | ORAL_TABLET | ORAL | Status: DC | PRN

## 2018-10-18 MED ORDER — BUPIVACAINE HCL (PF) 0.25 % IJ SOLN
INTRAMUSCULAR | Status: DC | PRN
  Administered 2018-10-18: 5 mL via EPIDURAL

## 2018-10-18 MED ORDER — ONDANSETRON HCL 4 MG/2ML IJ SOLN: 4.0000 mg | Freq: Four times a day (QID) | INTRAMUSCULAR | Status: DC | PRN

## 2018-10-18 MED ORDER — CALCIUM CARBONATE ANTACID 500 MG PO CHEW
200.0000 mg | CHEWABLE_TABLET | Freq: Three times a day (TID) | ORAL | Status: DC | PRN
  Administered 2018-10-18: 02:00:00 200 mg via ORAL

## 2018-10-18 MED ORDER — ONDANSETRON HCL 4 MG PO TABS: 4.0000 mg | ORAL_TABLET | ORAL | Status: DC | PRN

## 2018-10-18 MED ORDER — OXYTOCIN 40 UNITS IN NORMAL SALINE INFUSION - SIMPLE MED
2.5000 [IU]/h | INTRAVENOUS | Status: DC
  Administered 2018-10-18: 2.5 [IU]/h via INTRAVENOUS
  Filled 2018-10-18 (×2): qty 1000

## 2018-10-18 MED ORDER — ZOLPIDEM TARTRATE 5 MG PO TABS: 5.0000 mg | ORAL_TABLET | Freq: Every evening | ORAL | Status: DC | PRN

## 2018-10-18 MED ORDER — WITCH HAZEL-GLYCERIN EX PADS: 1.0000 "application " | MEDICATED_PAD | CUTANEOUS | Status: DC | PRN

## 2018-10-18 MED ORDER — FENTANYL 2.5 MCG/ML W/ROPIVACAINE 0.15% IN NS 100 ML EPIDURAL (ARMC)
EPIDURAL | Status: DC | PRN
  Administered 2018-10-18: 12 mL/h via EPIDURAL

## 2018-10-18 MED ORDER — LACTATED RINGERS IV SOLN
INTRAVENOUS | Status: DC
  Administered 2018-10-18: 02:00:00 via INTRAVENOUS

## 2018-10-18 MED ORDER — IBUPROFEN 600 MG PO TABS
600.0000 mg | ORAL_TABLET | Freq: Four times a day (QID) | ORAL | Status: DC
  Administered 2018-10-18 – 2018-10-19 (×4): 600 mg via ORAL
  Filled 2018-10-18 (×5): qty 1

## 2018-10-18 MED ORDER — DIPHENHYDRAMINE HCL 25 MG PO CAPS
25.0000 mg | ORAL_CAPSULE | Freq: Four times a day (QID) | ORAL | Status: DC | PRN
  Administered 2018-10-19: 25 mg via ORAL
  Filled 2018-10-18: qty 1

## 2018-10-18 MED ORDER — PHENYLEPHRINE 40 MCG/ML (10ML) SYRINGE FOR IV PUSH (FOR BLOOD PRESSURE SUPPORT): 80.0000 ug | PREFILLED_SYRINGE | INTRAVENOUS | Status: DC | PRN

## 2018-10-18 MED ORDER — LIDOCAINE HCL (PF) 1 % IJ SOLN: 30.0000 mL | INTRAMUSCULAR | Status: DC | PRN

## 2018-10-18 MED ORDER — PRENATAL MULTIVITAMIN CH
1.0000 | ORAL_TABLET | Freq: Every day | ORAL | Status: DC
  Administered 2018-10-18 – 2018-10-19 (×2): 1 via ORAL
  Filled 2018-10-18 (×3): qty 1

## 2018-10-18 MED ORDER — EPHEDRINE 5 MG/ML INJ: 10.0000 mg | INTRAVENOUS | Status: DC | PRN

## 2018-10-18 MED ORDER — LACTATED RINGERS IV SOLN: 500.0000 mL | Freq: Once | INTRAVENOUS | Status: DC

## 2018-10-18 MED ORDER — OXYCODONE-ACETAMINOPHEN 5-325 MG PO TABS: 1.0000 | ORAL_TABLET | ORAL | Status: DC | PRN

## 2018-10-18 MED ORDER — AMMONIA AROMATIC IN INHA
RESPIRATORY_TRACT | Status: AC
  Filled 2018-10-18: qty 10

## 2018-10-18 MED ORDER — BENZOCAINE-MENTHOL 20-0.5 % EX AERO: 1.0000 "application " | INHALATION_SPRAY | CUTANEOUS | Status: DC | PRN

## 2018-10-18 MED ORDER — FENTANYL 2.5 MCG/ML W/ROPIVACAINE 0.15% IN NS 100 ML EPIDURAL (ARMC)
EPIDURAL | Status: AC
  Filled 2018-10-18: qty 100

## 2018-10-18 MED ORDER — ONDANSETRON HCL 4 MG/2ML IJ SOLN: 4.0000 mg | INTRAMUSCULAR | Status: DC | PRN

## 2018-10-18 MED ORDER — ZOLPIDEM TARTRATE 5 MG PO TABS
5.0000 mg | ORAL_TABLET | Freq: Every evening | ORAL | Status: DC | PRN
  Administered 2018-10-18: 02:00:00 5 mg via ORAL

## 2018-10-18 MED ORDER — DOCUSATE SODIUM 100 MG PO CAPS
100.0000 mg | ORAL_CAPSULE | Freq: Two times a day (BID) | ORAL | Status: DC
  Administered 2018-10-18 – 2018-10-19 (×2): 100 mg via ORAL
  Filled 2018-10-18 (×2): qty 1

## 2018-10-18 MED ORDER — LACTATED RINGERS IV SOLN
500.0000 mL | INTRAVENOUS | Status: DC | PRN
  Administered 2018-10-18 (×2): 500 mL via INTRAVENOUS

## 2018-10-18 MED ORDER — TERBUTALINE SULFATE 1 MG/ML IJ SOLN: 0.2500 mg | Freq: Once | INTRAMUSCULAR | Status: DC | PRN

## 2018-10-18 MED ORDER — MISOPROSTOL 50MCG HALF TABLET
ORAL_TABLET | ORAL | Status: AC
  Administered 2018-10-18: 50 ug via VAGINAL
  Filled 2018-10-18: qty 1

## 2018-10-18 MED ORDER — BUTORPHANOL TARTRATE 2 MG/ML IJ SOLN
1.0000 mg | INTRAMUSCULAR | Status: DC | PRN
  Administered 2018-10-18: 1 mg via INTRAVENOUS
  Filled 2018-10-18: qty 1

## 2018-10-18 MED ORDER — MISOPROSTOL 50MCG HALF TABLET
50.0000 ug | ORAL_TABLET | ORAL | Status: DC
  Administered 2018-10-18: 02:00:00 50 ug via VAGINAL

## 2018-10-18 MED ORDER — SODIUM CHLORIDE 0.9 % IV SOLN: 250.0000 mL | INTRAVENOUS | Status: DC | PRN

## 2018-10-18 MED ORDER — SIMETHICONE 80 MG PO CHEW: 80.0000 mg | CHEWABLE_TABLET | ORAL | Status: DC | PRN

## 2018-10-18 MED ORDER — OXYTOCIN BOLUS FROM INFUSION
500.0000 mL | Freq: Once | INTRAVENOUS | Status: AC
  Administered 2018-10-18: 500 mL via INTRAVENOUS

## 2018-10-18 MED ORDER — SODIUM CHLORIDE 0.9% FLUSH: 3.0000 mL | Freq: Two times a day (BID) | INTRAVENOUS | Status: DC

## 2018-10-18 MED ORDER — SOD CITRATE-CITRIC ACID 500-334 MG/5ML PO SOLN: 30.0000 mL | ORAL | Status: DC | PRN

## 2018-10-18 MED ORDER — FENTANYL 2.5 MCG/ML W/ROPIVACAINE 0.15% IN NS 100 ML EPIDURAL (ARMC): 12.0000 mL/h | EPIDURAL | Status: DC

## 2018-10-18 MED ORDER — DIPHENHYDRAMINE HCL 50 MG/ML IJ SOLN: 12.5000 mg | INTRAMUSCULAR | Status: DC | PRN

## 2018-10-18 MED ORDER — OXYTOCIN 10 UNIT/ML IJ SOLN
INTRAMUSCULAR | Status: AC
  Filled 2018-10-18: qty 2

## 2018-10-18 NOTE — H&P (Signed)
Obstetric History and Physical  Evelyn Caldwell is a 28 y.o. G2P1001 with IUP at [redacted]w[redacted]d presenting with orders for IOL. Patient states she has been having  irregular, every 6 minutes contractions, none vaginal bleeding, intact membranes, with active fetal movement.    Prenatal Course Source of Care: [facility]  Pregnancy complications or risks:right renal stone removed this pregnancy, anemia  Prenatal labs and studies: ABO, Rh: --/--/A POS (05/20 0258) Antibody: NEG (05/20 0258) Rubella: 3.00 (10/04 1013) RPR: Non Reactive (03/10 1456)  HBsAg: Negative (10/04 1013)  HIV: Non Reactive (10/04 1013)  BTD:HRCBULAG (04/28 1400) 1 hr Glucola  normal Genetic screening normal Anatomy US normal  Past Medical History:  Diagnosis Date  . Kidney stone   . Vulvar lesion       OB History  Gravida Para Term Preterm AB Living  2 1 1     1   SAB TAB Ectopic Multiple Live Births          1    # Outcome Date GA Lbr Len/2nd Weight Sex Delivery Anes PTL Lv  2 Current           1 Term 03/09/16 [redacted]w[redacted]d  3674 g F Vag-Spont   LIV    Social History   Socioeconomic History  . Marital status: Married    Spouse name: Not on file  . Number of children: Not on file  . Years of education: Not on file  . Highest education level: Not on file  Occupational History  . Not on file  Social Needs  . Financial resource strain: Not hard at all  . Food insecurity:    Worry: Patient refused    Inability: Patient refused  . Transportation needs:    Medical: Patient refused    Non-medical: Patient refused  Tobacco Use  . Smoking status: Never Smoker  . Smokeless tobacco: Never Used  Substance and Sexual Activity  . Alcohol use: No    Comment: occasionally  . Drug use: No  . Sexual activity: Yes    Birth control/protection: None  Lifestyle  . Physical activity:    Days per week: Patient refused    Minutes per session: Patient refused  . Stress: Not on file  Relationships  . Social connections:     Talks on phone: Patient refused    Gets together: Patient refused    Attends religious service: Patient refused    Active member of club or organization: Patient refused    Attends meetings of clubs or organizations: Patient refused    Relationship status: Patient refused  Other Topics Concern  . Not on file  Social History Narrative  . Not on file    Family History  Problem Relation Age of Onset  . Congestive Heart Failure Maternal Grandfather   . Parkinson's disease Paternal Grandmother   . Kidney failure Cousin   . Kidney cancer Neg Hx   . Prostate cancer Neg Hx     Medications Prior to Admission  Medication Sig Dispense Refill Last Dose  . acetaminophen (TYLENOL) 500 MG tablet Take 500-1,000 mg by mouth every 6 (six) hours as needed (HEADACHES).    Past Week at Unknown time  . magnesium oxide (MAG-OX) 400 (241.3 Mg) MG tablet Take 1 tablet (400 mg total) by mouth 2 (two) times daily as needed. 60 tablet 2 Past Week at Unknown time  . nitrofurantoin, macrocrystal-monohydrate, (MACROBID) 100 MG capsule Take 100 mg by mouth 2 (two) times daily.   10/17/2018 at Unknown  time  . Prenatal Vit-Fe Fumarate-FA (PRENATAL MULTIVITAMIN) TABS tablet Take 1 tablet by mouth daily.    Past Week at Unknown time    No Known Allergies  Review of Systems: Negative except for what is mentioned in HPI.  Physical Exam: BP 128/71   Pulse 79   Temp 98.1 F (36.7 C)   Resp 18   Ht 5\' 7"  (1.702 m)   Wt 88.5 kg   LMP 01/09/2018   SpO2 97%   BMI 30.54 kg/m  GENERAL: Well-developed, well-nourished female in no acute distress.  LUNGS: Clear to auscultation bilaterally.  HEART: Regular rate and rhythm. ABDOMEN: Soft, nontender, nondistended, gravid. EXTREMITIES: Nontender, no edema, 2+ distal pulses. Cervical Exam: Dilation: 10 Dilation Complete Date: 10/18/18 Dilation Complete Time: 0830 Effacement (%): 100 Cervical Position: Middle Station: Plus 1 Presentation: Vertex Exam by:: B  Ricky Ala RN FHT:  Baseline rate 144 bpm   Variability moderate  Accelerations present   Decelerations none Contractions: Every 2 mins   Pertinent Labs/Studies:   Results for orders placed or performed during the hospital encounter of 10/18/18 (from the past 24 hour(s))  CBC     Status: Abnormal   Collection Time: 10/18/18 12:51 AM  Result Value Ref Range   WBC 7.9 4.0 - 10.5 K/uL   RBC 3.12 (L) 3.87 - 5.11 MIL/uL   Hemoglobin 7.5 (L) 12.0 - 15.0 g/dL   HCT 23.5 (L) 36.0 - 46.0 %   MCV 75.3 (L) 80.0 - 100.0 fL   MCH 24.0 (L) 26.0 - 34.0 pg   MCHC 31.9 30.0 - 36.0 g/dL   RDW 15.2 11.5 - 15.5 %   Platelets 225 150 - 400 K/uL   nRBC 0.0 0.0 - 0.2 %  Type and screen     Status: None   Collection Time: 10/18/18  2:58 AM  Result Value Ref Range   ABO/RH(D) A POS    Antibody Screen NEG    Sample Expiration      10/21/2018,2359 Performed at [facility], [address] [place], [place] [address]     Assessment : Evelyn Caldwell is a 28 y.o. G2P1001 at [redacted]w[redacted]d being admitted for labor.  Plan: Labor: now completely dilated after 1 dose cytotec, will start pushing FWB: Reassuring fetal heart tracing.  GBS positive Delivery plan: Hopeful for vaginal delivery  Danylle Ouk, CNM [facility]

## 2018-10-18 NOTE — Anesthesia Procedure Notes (Signed)
Epidural Patient location during procedure: OB  Staffing Anesthesiologist: Gunnar Bulla, MD Performed: anesthesiologist   Preanesthetic Checklist Completed: patient identified, site marked, surgical consent, pre-op evaluation, timeout performed, IV checked, risks and benefits discussed and monitors and equipment checked  Epidural Patient position: sitting Prep: ChloraPrep Patient monitoring: heart rate, continuous pulse ox and blood pressure Approach: midline Location: L4-L5 Injection technique: LOR saline  Needle:  Needle type: Tuohy  Needle gauge: 18 G Needle length: 9 cm and 9 Catheter type: closed end flexible Catheter size: 20 Guage Test dose: negative and 1.5% lidocaine with Epi 1:200 K  Assessment Sensory level: T10 Events: blood not aspirated, injection not painful, no injection resistance, negative IV test and no paresthesia  Additional Notes   Patient tolerated the insertion well without complications.Reason for block:procedure for pain

## 2018-10-18 NOTE — Anesthesia Preprocedure Evaluation (Signed)
Anesthesia Evaluation  Patient identified by MRN, date of birth, ID band Patient awake    Reviewed: Allergy & Precautions, NPO status , Patient's Chart, lab work & pertinent test results, reviewed documented beta blocker date and time   Airway Mallampati: II  TM Distance: >3 FB     Dental  (+) Chipped   Pulmonary           Cardiovascular      Neuro/Psych  Headaches,    GI/Hepatic   Endo/Other    Renal/GU Renal disease     Musculoskeletal   Abdominal   Peds  Hematology   Anesthesia Other Findings Hb 7.5.  Reproductive/Obstetrics                             Anesthesia Physical Anesthesia Plan  ASA: III  Anesthesia Plan: Epidural   Post-op Pain Management:    Induction:   PONV Risk Score and Plan:   Airway Management Planned:   Additional Equipment:   Intra-op Plan:   Post-operative Plan:   Informed Consent: I have reviewed the patients History and Physical, chart, labs and discussed the procedure including the risks, benefits and alternatives for the proposed anesthesia with the patient or authorized representative who has indicated his/her understanding and acceptance.       Plan Discussed with: CRNA  Anesthesia Plan Comments:         Anesthesia Quick Evaluation

## 2018-10-19 LAB — VITAMIN B12: Vitamin B-12: 179 pg/mL — ABNORMAL LOW (ref 180–914)

## 2018-10-19 LAB — CBC
HCT: 22 % — ABNORMAL LOW (ref 36.0–46.0)
Hemoglobin: 6.8 g/dL — ABNORMAL LOW (ref 12.0–15.0)
MCH: 23.4 pg — ABNORMAL LOW (ref 26.0–34.0)
MCHC: 30.9 g/dL (ref 30.0–36.0)
MCV: 75.6 fL — ABNORMAL LOW (ref 80.0–100.0)
Platelets: 186 10*3/uL (ref 150–400)
RBC: 2.91 MIL/uL — ABNORMAL LOW (ref 3.87–5.11)
RDW: 15.4 % (ref 11.5–15.5)
WBC: 8.5 10*3/uL (ref 4.0–10.5)
nRBC: 0 % (ref 0.0–0.2)

## 2018-10-19 LAB — FERRITIN: Ferritin: 7 ng/mL — ABNORMAL LOW (ref 11–307)

## 2018-10-19 MED ORDER — SODIUM CHLORIDE 0.9 % IV SOLN
25.0000 mg | Freq: Once | INTRAVENOUS | Status: AC
  Administered 2018-10-19: 25 mg via INTRAVENOUS
  Filled 2018-10-19: qty 0.5

## 2018-10-19 MED ORDER — SODIUM CHLORIDE 0.9 % IV SOLN
500.0000 mg | Freq: Once | INTRAVENOUS | Status: DC
  Filled 2018-10-19: qty 10

## 2018-10-19 MED ORDER — VITAMIN D3 125 MCG (5000 UT) PO CAPS: 1.0000 | ORAL_CAPSULE | Freq: Every day | ORAL | 2 refills | Status: DC

## 2018-10-19 MED ORDER — DOCUSATE SODIUM 100 MG PO CAPS: 100.0000 mg | ORAL_CAPSULE | Freq: Every day | ORAL | 1 refills | Status: DC

## 2018-10-19 MED ORDER — FUSION PLUS PO CAPS: 1.0000 | ORAL_CAPSULE | Freq: Every day | ORAL | 1 refills | Status: DC

## 2018-10-19 MED ORDER — IBUPROFEN 600 MG PO TABS: 600.0000 mg | ORAL_TABLET | Freq: Four times a day (QID) | ORAL | 0 refills | Status: DC

## 2018-10-19 NOTE — Anesthesia Postprocedure Evaluation (Signed)
Anesthesia Post Note  Patient: Evelyn Caldwell  Procedure(s) Performed: AN AD HOC LABOR EPIDURAL  Patient location during evaluation: Mother Baby Anesthesia Type: Epidural Level of consciousness: awake and alert Pain management: pain level controlled Vital Signs Assessment: post-procedure vital signs reviewed and stable Respiratory status: spontaneous breathing, nonlabored ventilation and respiratory function stable Cardiovascular status: stable Postop Assessment: no headache, no backache and epidural receding Anesthetic complications: no     Last Vitals:  Vitals:   10/19/18 0338 10/19/18 0740  BP: 130/75 113/63  Pulse: 68 81  Resp: 20 18  Temp: 36.6 C 36.8 C  SpO2: 100% 100%    Last Pain:  Vitals:   10/19/18 0740  TempSrc: Oral  PainSc:                  [name]

## 2018-10-19 NOTE — Progress Notes (Signed)
Discharge order received from CNM. Reviewed discharge instructions and prescriptions with patient and answered all questions. Follow up appointment instructions given. Patient verbalized understanding. ID bands checked. Patient discharged home with infant via wheelchair by nursing/auxillary.    Hilbert Bible, RN

## 2018-10-19 NOTE — Lactation Note (Signed)
This note was copied from a baby's chart. Lactation Consultation Note  Patient Name: Evelyn Caldwell Date: 10/19/2018 Reason for consult: Follow-up assessment   Maternal Data    Feeding Feeding Type: Breast Fed  LATCH Score Latch: Repeated attempts needed to sustain latch, nipple held in mouth throughout feeding, stimulation needed to elicit sucking reflex.  Audible Swallowing: A few with stimulation  Type of Nipple: Everted at rest and after stimulation  Comfort (Breast/Nipple): Soft / non-tender  Hold (Positioning): Assistance needed to correctly position infant at breast and maintain latch.  LATCH Score: 7  Interventions Interventions: Breast feeding basics reviewed;Assisted with latch;Adjust position;Support pillows  Lactation Tools Discussed/Used Pump Review: Setup, frequency, and cleaning Initiated by:: Elvera Lennox, RN IBCLC Date initiated:: 10/19/18   Consult Status  LC assisted with latch and positioning of infant for breastfeeding. Mother states that infant is causing her pain while latching. Mother was taught how to flange infant's lips out for a deeper latch and she states the latch felt better after these adjustments.   LC returned and helped the mother initiate pumping due to elevated jaundice level and blood loss during the delivery. Plan is for mother to breastfeed infant and pump afterwards to establish milk supply.     Elvera Lennox 07/29/6008, 2:56 PM

## 2018-10-19 NOTE — Progress Notes (Signed)
Post Partum Day 1 Subjective: no complaints, up ad lib and voiding  Objective: Blood pressure 113/63, pulse 81, temperature 98.3 F (36.8 C), temperature source Oral, resp. rate 18, height 5\' 7"  (1.702 m), weight 88.5 kg, last menstrual period 01/09/2018, SpO2 100 %, unknown if currently breastfeeding.  Physical Exam:  General: alert, cooperative, appears stated age and pale Lochia: appropriate Uterine Fundus: firm Incision: NA DVT Evaluation: No evidence of DVT seen on physical exam. Negative Homan's sign. Calf/Ankle edema is present.  Recent Labs    10/18/18 0051 10/19/18 0525  HGB 7.5* 6.8*  HCT 23.5* 22.0*    Assessment/Plan: Breastfeeding and Lactation consult; iron deficiency anemia- will infuse iron 500mg  now. TED hose placed Will consider d/c later today if infant stable for discharge. Infant feeding Breast- is currently pumping as infant is under bili lights; Bottle; Both    LOS: 1 day   Evelyn Caldwell N Denzell Colasanti 10/19/2018, 1:38 PM

## 2018-10-19 NOTE — Discharge Instructions (Signed)
Please call your doctor or return to the ER if you experience any chest pains, shortness of breath, dizziness, visual changes, fever greater than 101, any heavy bleeding (saturating more than 1 pad per hour), large clots, or foul smelling discharge, any worsening abdominal pain and cramping that is not controlled by pain medication, or any signs of postpartum depression. No tampons, enemas, douches, or sexual intercourse for 6 weeks. Also avoid tub baths, hot tubs, or swimming for 6 weeks.

## 2018-10-19 NOTE — Plan of Care (Signed)
Vs stable; up ad lib; taking motrin for pain control BUT declined the dose offered at 0430; pre delivery HGB is 7.5 and pt has been asymptomatic all shift; this morning HGB is 6.8; SL in place and flushed; pt tolerating regular diet; breastfeeding baby

## 2018-10-19 NOTE — Discharge Summary (Signed)
Physician Obstetric Discharge Summary  Patient ID: Evelyn Caldwell MRN: [id number] DOB/AGE: 28-Jun-1990 29 y.o.   Date of Admission: 10/18/2018  Date of Discharge:   Admitting Diagnosis: Induction of labor at [redacted]w[redacted]d  Secondary Diagnosis: Anemia in pregnancy  Mode of Delivery: normal spontaneous vaginal delivery     Discharge Diagnosis: PP anemia, SVD at term   Intrapartum Procedures: laceration 1st   Post partum procedures: attempted iron infusion, but discountinued due to side effect with test dose  Complications: 1st degree perineal laceration   Brief Hospital Course  Jaylnn Ullery is a [id number] who had a SVD on 10/18/2018;  for further details of this, please refer to the delivey note.  Patient had an uncomplicated postpartum course.  By time of discharge on PPD#1, her pain was controlled on oral pain medications; she had appropriate lochia and was ambulating, voiding without difficulty and tolerating regular diet.  She was deemed stable for discharge to home.    Labs: CBC Latest Ref Rng & Units 10/19/2018 10/18/2018 08/08/2018  WBC 4.0 - 10.5 K/uL 8.5 7.9 8.4  Hemoglobin 12.0 - 15.0 g/dL 6.8(L) 7.5(L) 9.1(L)  Hematocrit 36.0 - 46.0 % 22.0(L) 23.5(L) 26.4(L)  Platelets 150 - 400 K/uL 186 225 311   A POS  Physical exam:  Blood pressure 135/86, pulse 68, temperature 98.1 F (36.7 C), temperature source Oral, resp. rate 18, height 5\' 7"  (1.702 m), weight 88.5 kg, last menstrual period 01/09/2018, SpO2 100 %, unknown if currently breastfeeding. General: alert and no distress Lochia: appropriate Abdomen: soft, NT Uterine Fundus: firm Extremities: No evidence of DVT seen on physical exam. Moderate non-pitting lower extremity edema.  Discharge Instructions: Per After Visit Summary. Activity: Advance as tolerated. Pelvic rest for 6 weeks.  Also refer to After Visit Summary Diet: Regular Medications: Allergies as of 10/19/2018   No Known Allergies      Medication List    STOP taking these medications   magnesium oxide 400 (241.3 Mg) MG tablet Commonly known as:  MAG-OX     TAKE these medications   acetaminophen 500 MG tablet Commonly known as:  TYLENOL Take 500-1,000 mg by mouth every 6 (six) hours as needed (HEADACHES).   docusate sodium 100 MG capsule Commonly known as:  COLACE Take 1 capsule (100 mg total) by mouth daily.   Fusion Plus Caps Take 1 capsule by mouth daily.   ibuprofen 600 MG tablet Commonly known as:  ADVIL Take 1 tablet (600 mg total) by mouth every 6 (six) hours.   Macrobid 100 MG capsule Generic drug:  nitrofurantoin (macrocrystal-monohydrate) Take 100 mg by mouth 2 (two) times daily.   prenatal multivitamin Tabs tablet Take 1 tablet by mouth daily.   Vitamin D3 125 MCG (5000 UT) Caps Take 1 capsule (5,000 Units total) by mouth daily.      Outpatient follow up:  Postpartum contraception: unsure-but considering another Mirena  Discharged Condition: stable  Discharged to: home   Newborn Data: Disposition:home with mother  Apgars: APGAR (1 MIN): 8   APGAR (5 MINS): 9   APGAR (10 MINS):    Baby Feeding: Breast  Mekhi Sonn Rockney Ghee, CNM

## 2018-10-21 LAB — RPR: RPR Ser Ql: NONREACTIVE

## 2018-10-26 ENCOUNTER — Other Ambulatory Visit: Payer: Self-pay

## 2018-10-26 ENCOUNTER — Emergency Department: Payer: 59

## 2018-10-26 ENCOUNTER — Emergency Department
Admission: EM | Admit: 2018-10-26 | Discharge: 2018-10-26 | Disposition: A | Discharge status: Home / Self Care | Payer: 59 | Attending: Emergency Medicine | Admitting: Emergency Medicine

## 2018-10-26 ENCOUNTER — Telehealth: Payer: Self-pay

## 2018-10-26 ENCOUNTER — Encounter: Payer: Self-pay | Admitting: Emergency Medicine

## 2018-10-26 DIAGNOSIS — R079 Chest pain, unspecified: Secondary | ICD-10-CM

## 2018-10-26 DIAGNOSIS — J189 Pneumonia, unspecified organism: Secondary | ICD-10-CM | POA: Insufficient documentation

## 2018-10-26 DIAGNOSIS — R0602 Shortness of breath: Secondary | ICD-10-CM

## 2018-10-26 DIAGNOSIS — Z79899 Other long term (current) drug therapy: Secondary | ICD-10-CM | POA: Diagnosis not present

## 2018-10-26 LAB — BASIC METABOLIC PANEL
Anion gap: 10 (ref 5–15)
BUN: 10 mg/dL (ref 6–20)
CO2: 23 mmol/L (ref 22–32)
Calcium: 7.8 mg/dL — ABNORMAL LOW (ref 8.9–10.3)
Chloride: 109 mmol/L (ref 98–111)
Creatinine, Ser: 0.58 mg/dL (ref 0.44–1.00)
GFR calc Af Amer: >60 mL/min (ref >60)
GFR calc non Af Amer: >60 mL/min (ref >60)
Glucose, Bld: 97 mg/dL (ref 70–99)
Potassium: 2.9 mmol/L — ABNORMAL LOW (ref 3.5–5.1)
Sodium: 142 mmol/L (ref 135–145)

## 2018-10-26 LAB — CBC
HCT: 28.4 % — ABNORMAL LOW (ref 36.0–46.0)
Hemoglobin: 8.7 g/dL — ABNORMAL LOW (ref 12.0–15.0)
MCH: 23.7 pg — ABNORMAL LOW (ref 26.0–34.0)
MCHC: 30.6 g/dL (ref 30.0–36.0)
MCV: 77.4 fL — ABNORMAL LOW (ref 80.0–100.0)
Platelets: 311 10*3/uL (ref 150–400)
RBC: 3.67 MIL/uL — ABNORMAL LOW (ref 3.87–5.11)
RDW: 18.6 % — ABNORMAL HIGH (ref 11.5–15.5)
WBC: 7.2 10*3/uL (ref 4.0–10.5)
nRBC: 0.3 % — ABNORMAL HIGH (ref 0.0–0.2)

## 2018-10-26 LAB — TROPONIN I: Troponin I: <0.03 ng/mL (ref <0.03)

## 2018-10-26 MED ORDER — AMOXICILLIN 500 MG PO CAPS: 1000.0000 mg | ORAL_CAPSULE | Freq: Three times a day (TID) | ORAL | 0 refills | Status: AC

## 2018-10-26 NOTE — ED Triage Notes (Addendum)
Here for chest tightness over last week.  Does have cough, seems to be more so when laying down.  No SHOB.  Pain is intermittent and seems to come when laying down or exerting self.  Pain is to central chest.  Pain also worse when laying on left side.  OB told pt to come to ED.  Had baby 1 week ago.

## 2018-10-26 NOTE — Telephone Encounter (Signed)
Spoke with patient, she refuses to go to ER as Evelyn Caldwell recommended yesterday.  Patient states her symptoms come and go, symptoms are mainly when she lays down and prefers not to go to ER due to having a newborn.  Advised patient to go to ER as recommended.  I explained to her that the ER is equipped in the event that she needs imaging and labs.  Patient verbalized understanding.

## 2018-10-26 NOTE — ED Provider Notes (Signed)
[facility] Emergency Department Provider Note   ____________________________________________   I have reviewed the triage vital signs and the nursing notes.   HISTORY  Chief Complaint Shortness of breath  History limited by: Not Limited   HPI Evelyn Caldwell is a 28 y.o. female who presents to the emergency department today with primary concern for shortness of breath. Patient did deliver a baby roughly 1 week ago. She states that for the past couple of days she has been having shortness of breath. Worse when she lies flat or exerts herself. Was having some shortness of breath with exertion prior to delivery. The patient has had some associated chest tightness. Has had a non productive cough. Has had bilateral leg swelling that started prior to delivery and is improving. Denies any fevers.   Records reviewed. Per medical record review patient has a history of recent delivery.  Past Medical History:  Diagnosis Date  . Kidney stone   . Vulvar lesion     Patient Active Problem List   Diagnosis Date Noted  . Encounter for elective induction of labor 10/18/2018  . Renal calculi 08/03/2018  . Labor and delivery, indication for care 07/27/2018  . Kidney stone complicating pregnancy, second trimester 07/26/2018  . Pregnancy 07/25/2018  . Internal hemorrhoids 08/18/2016  . Status post vacuum-assisted vaginal delivery 03/09/2016  . Hydronephrosis 01/26/2016  . Nephrolithiasis 10/22/2015  . Headache, migraine 01/24/2015      Prior to Admission medications   Medication Sig Start Date End Date Taking? Authorizing Provider  acetaminophen (TYLENOL) 500 MG tablet Take 500-1,000 mg by mouth every 6 (six) hours as needed (HEADACHES).     [provider]  Cholecalciferol (VITAMIN D3) 125 MCG (5000 UT) CAPS Take 1 capsule (5,000 Units total) by mouth daily. 10/19/18   Shambley, Melody N, CNM  docusate sodium (COLACE) 100 MG capsule Take 1 capsule  (100 mg total) by mouth daily. 10/19/18   Shambley, Melody N, CNM  ibuprofen (ADVIL) 600 MG tablet Take 1 tablet (600 mg total) by mouth every 6 (six) hours. 10/19/18   Shambley, Melody N, CNM  Iron-FA-B Cmp-C-Biot-Probiotic (FUSION PLUS) CAPS Take 1 capsule by mouth daily. 10/19/18   Shambley, Melody N, CNM  nitrofurantoin, macrocrystal-monohydrate, (MACROBID) 100 MG capsule Take 100 mg by mouth 2 (two) times daily.    [provider]  Prenatal Vit-Fe Fumarate-FA (PRENATAL MULTIVITAMIN) TABS tablet Take 1 tablet by mouth daily.     [provider]    Allergies Patient has no known allergies.  Family History  Problem Relation Age of Onset  . Congestive Heart Failure Maternal Grandfather   . Parkinson's disease Paternal Grandmother   . Kidney failure Cousin   . Kidney cancer Neg Hx   . Prostate cancer Neg Hx     Social History Social History   Tobacco Use  . Smoking status: Never Smoker  . Smokeless tobacco: Never Used  Substance Use Topics  . Alcohol use: No    Comment: occasionally  . Drug use: No    Review of Systems Constitutional: No fever/chills Eyes: No visual changes. ENT: No sore throat. Cardiovascular: Positive for bilateral chest tightness Respiratory: Positive  shortness of breath. Gastrointestinal: No abdominal pain.  No nausea, no vomiting.  No diarrhea.   Genitourinary: Negative for dysuria. Musculoskeletal: Positive for bilateral foot swelling.  Skin: Negative for rash. Neurological: Negative for headaches, focal weakness or numbness.  ____________________________________________   PHYSICAL EXAM:  VITAL SIGNS: ED Triage Vitals  Enc  Vitals Group     BP 10/26/18 1619 (!) 162/92     Pulse Rate 10/26/18 1619 76     Resp 10/26/18 1619 16     Temp --      Temp src --      SpO2 10/26/18 1619 98 %     Weight 10/26/18 1617 185 lb (83.9 kg)     Height 10/26/18 1617 5\' 7"  (1.702 m)     Head Circumference --      Peak Flow --      Pain  Score 10/26/18 1616 0   Constitutional: Alert and oriented.  Eyes: Conjunctivae are normal.  ENT      Head: Normocephalic and atraumatic.      Nose: No congestion/rhinnorhea.      Mouth/Throat: Mucous membranes are moist.      Neck: No stridor. Hematological/Lymphatic/Immunilogical: No cervical lymphadenopathy. Cardiovascular: Normal rate, regular rhythm.  No murmurs, rubs, or gallops.  Respiratory: Normal respiratory effort without tachypnea nor retractions. Breath sounds are clear and equal bilaterally. No wheezes/rales/rhonchi. Gastrointestinal: Soft and non tender. No rebound. No guarding.  Genitourinary: Deferred Musculoskeletal: Normal range of motion in all extremities. 1+ edema to bilateral feet. No tenderness to palpation of the calves. No erythema.  Neurologic:  Normal speech and language. No gross focal neurologic deficits are appreciated.  Skin:  Skin is warm, dry and intact. No rash noted. Psychiatric: Mood and affect are normal. Speech and behavior are normal. Patient exhibits appropriate insight and judgment.  ____________________________________________    LABS (pertinent positives/negatives)  Trop <0.03 BMP wnl except k 2.9, ca 7.8 CBC wbc 7.2, hgb 8.7, plt 311  ____________________________________________   EKG  I, Nance Pear, attending physician, personally viewed and interpreted this EKG  EKG Time: 1618 Rate: 80 Rhythm: normal sinus rhythm Axis: normal Intervals: qtc 452 QRS: narrow ST changes: no st elevation Impression: normal ekg ____________________________________________    RADIOLOGY  CXR Opacity in right lower lung concerning for pneumonia  ____________________________________________   PROCEDURES  Procedures  ____________________________________________   INITIAL IMPRESSION / ASSESSMENT AND PLAN / ED COURSE  Pertinent labs & imaging results that were available during my care of the patient were reviewed by me and  considered in my medical decision making (see chart for details).   Patient presented to the emergency department today because of concern for shortness of breath. On exam patient without tachypnea or tachycardia, breath sounds clear. No murmurs appreciated. The patient's x-ray was concerning for pneumonia. Do think this could explain the patient's symptoms. Discussed with patient. Will plan on starting antibiotics. Additionally did discuss patient's concern for possible blood clot. At this time I think low risk given lack of tachypnea, tachycardia or hypoxia. She does have some bilateral lower extremity edema however she says it is getting better and she does not have any lower extremity pain nor redness. Patient felt comfortable deferring CT scan at this time, discussed return precautions if her symptoms persisted after abx use.    ____________________________________________   FINAL CLINICAL IMPRESSION(S) / ED DIAGNOSES  Final diagnoses:  Shortness of breath  Pneumonia due to infectious organism, unspecified laterality, unspecified part of lung     Note: This dictation was prepared with Dragon dictation. Any transcriptional errors that result from this process are unintentional     Nance Pear, MD 10/26/18 1754

## 2018-10-26 NOTE — Discharge Instructions (Addendum)
Please seek medical attention for any high fevers, chest pain, shortness of breath, change in behavior, persistent vomiting, bloody stool or any other new or concerning symptoms.  

## 2018-11-28 ENCOUNTER — Telehealth: Payer: Self-pay

## 2018-11-28 NOTE — Telephone Encounter (Signed)
Coronavirus (COVID-19) Are you at risk?  Are you at risk for the Coronavirus (COVID-19)?  To be considered HIGH RISK for Coronavirus (COVID-19), you have to meet the following criteria:  . Traveled to [place], [place], [place], [place] or [place]; or in the [place] to [place], [place], [place], or [place]; and have fever, cough, and shortness of breath within the last 2 weeks of travel OR . Been in close contact with a person diagnosed with COVID-19 within the last 2 weeks and have fever, cough, and shortness of breath . IF YOU DO NOT MEET THESE CRITERIA, YOU ARE CONSIDERED LOW RISK FOR COVID-19.  What to do if you are HIGH RISK for COVID-19?  . If you are having a medical emergency, call 911. . Seek medical care right away. Before you go to a doctor's office, urgent care or emergency department, call ahead and tell them about your recent travel, contact with someone diagnosed with COVID-19, and your symptoms. You should receive instructions from your physician's office regarding next steps of care.  . When you arrive at healthcare provider, tell the healthcare staff immediately you have returned from visiting [place], [place], [place], [place] or [place]; or traveled in the [place] to [place], [place], [place], or [place]; in the last two weeks or you have been in close contact with a person diagnosed with COVID-19 in the last 2 weeks.   . Tell the health care staff about your symptoms: fever, cough and shortness of breath. . After you have been seen by a medical provider, you will be either: o Tested for (COVID-19) and discharged home on quarantine except to seek medical care if symptoms worsen, and asked to  - Stay home and avoid contact with others until you get your results (4-5 days)  - Avoid travel on public transportation if possible (such as bus, train, or airplane) or o Sent to the Emergency Department by EMS for evaluation, COVID-19 testing, and possible  admission depending on your condition and test results.  What to do if you are LOW RISK for COVID-19?  Reduce your risk of any infection by using the same precautions used for avoiding the common cold or flu:  . Wash your hands often with soap and warm water for at least 20 seconds.  If soap and water are not readily available, use an alcohol-based hand sanitizer with at least 60% alcohol.  . If coughing or sneezing, cover your mouth and nose by coughing or sneezing into the elbow areas of your shirt or coat, into a tissue or into your sleeve (not your hands). . Avoid shaking hands with others and consider head nods or verbal greetings only. . Avoid touching your eyes, nose, or mouth with unwashed hands.  . Avoid close contact with people who are sick. . Avoid places or events with large numbers of people in one location, like concerts or sporting events. . Carefully consider travel plans you have or are making. . If you are planning any travel outside or inside the [place], visit the CDC's Travelers' Health webpage for the latest health notices. . If you have some symptoms but not all symptoms, continue to monitor at home and seek medical attention if your symptoms worsen. . If you are having a medical emergency, call 911.   ADDITIONAL HEALTHCARE OPTIONS FOR PATIENTS  [facility] / e-Visit: [email or web address]         [facility]: [phone]  [facility]: [phone]                   [facility]: [phone]   Pre-screen negative, DM.   

## 2018-11-29 ENCOUNTER — Ambulatory Visit (INDEPENDENT_AMBULATORY_CARE_PROVIDER_SITE_OTHER): Payer: 59 | Admitting: Obstetrics and Gynecology

## 2018-11-29 ENCOUNTER — Other Ambulatory Visit: Payer: Self-pay

## 2018-11-29 ENCOUNTER — Encounter: Payer: Self-pay | Admitting: Obstetrics and Gynecology

## 2018-11-29 DIAGNOSIS — Z130 Encounter for screening for diseases of the blood and blood-forming organs and certain disorders involving the immune mechanism: Secondary | ICD-10-CM

## 2018-11-29 MED ORDER — NORETHINDRONE 0.35 MG PO TABS: 1.0000 | ORAL_TABLET | Freq: Every day | ORAL | 11 refills | Status: DC

## 2018-11-29 NOTE — Progress Notes (Signed)
  Subjective:     Evelyn Caldwell is a 28 y.o. female who presents for a postpartum visit. She is 6 weeks postpartum following a spontaneous vaginal delivery. I have fully reviewed the prenatal and intrapartum course. The delivery was at 39.5 gestational weeks. Outcome: spontaneous vaginal delivery. Anesthesia: epidural. Postpartum course has been uncomplicated. Baby's course has been uncomplicated. Baby is feeding by breast. Bleeding no bleeding. Bowel function is normal. Bladder function is normal. Patient is not sexually active. Contraception method is abstinence. Postpartum depression screening: negative. Depression screen [facility] 2/9 11/29/2018  Decreased Interest 0  Down, Depressed, Hopeless 0  PHQ - 2 Score 0  Altered sleeping 0  Tired, decreased energy 0  Change in appetite 0  Feeling bad or failure about yourself  0  Trouble concentrating 0  Moving slowly or fidgety/restless 0  Suicidal thoughts 0  PHQ-9 Score 0  Difficult doing work/chores Not difficult at all   The following portions of the patient's history were reviewed and updated as appropriate: allergies, current medications, past family history, past medical history, past social history, past surgical history and problem list.  Review of Systems A comprehensive review of systems was negative.   Objective:    BP 123/84   Pulse 86   Ht 5\' 7"  (1.702 m)   Wt 151 lb 11.2 oz (68.8 kg)   Breastfeeding Yes Comment: has been bleeding since birth  BMI 23.76 kg/m   General:  alert, cooperative and appears stated age   Breasts:  inspection negative, no nipple discharge or bleeding, no masses or nodularity palpable  Lungs: clear to auscultation bilaterally  Heart:  regular rate and rhythm, S1, S2 normal, no murmur, click, rub or gallop  Abdomen: soft, non-tender; bowel sounds normal; no masses,  no organomegaly   Vulva:  normal  Vagina: normal vagina, no discharge, exudate, lesion, or erythema  Cervix:  multiparous  appearance  Corpus: normal size, contour, position, consistency, mobility, non-tender  Adnexa:  no mass, fullness, tenderness  Rectal Exam: Not performed.        Assessment:     6 weeks postpartum exam.   Plan:    1. Contraception: oral progesterone-only contraceptive 2. Anemia labs obtained- will follow up accordingly 3. Follow up in: 3 months or as needed.

## 2018-11-29 NOTE — Patient Instructions (Signed)
  Place postpartum visit patient instructions here.  

## 2018-11-30 LAB — CBC
Hematocrit: 34.3 % (ref 34.0–46.6)
Hemoglobin: 11.1 g/dL (ref 11.1–15.9)
MCH: 24.3 pg — ABNORMAL LOW (ref 26.6–33.0)
MCHC: 32.4 g/dL (ref 31.5–35.7)
MCV: 75 fL — ABNORMAL LOW (ref 79–97)
Platelets: 233 10*3/uL (ref 150–450)
RBC: 4.57 x10E6/uL (ref 3.77–5.28)
RDW: 18.4 % — ABNORMAL HIGH (ref 11.7–15.4)
WBC: 6.6 10*3/uL (ref 3.4–10.8)

## 2018-11-30 LAB — B12 AND FOLATE PANEL
Folate: 13.6 ng/mL (ref >3.0)
Vitamin B-12: 695 pg/mL (ref 232–1245)

## 2018-11-30 LAB — VITAMIN D 25 HYDROXY (VIT D DEFICIENCY, FRACTURES): Vit D, 25-Hydroxy: 61.7 ng/mL (ref 30.0–100.0)

## 2018-11-30 LAB — FERRITIN: Ferritin: 18 ng/mL (ref 15–150)

## 2018-12-06 ENCOUNTER — Other Ambulatory Visit: Payer: Self-pay | Admitting: Obstetrics and Gynecology

## 2018-12-06 MED ORDER — METOCLOPRAMIDE HCL 10 MG PO TABS: 10.0000 mg | ORAL_TABLET | Freq: Two times a day (BID) | ORAL | 2 refills | Status: DC

## 2018-12-15 ENCOUNTER — Encounter: Payer: Self-pay | Admitting: Obstetrics and Gynecology

## 2018-12-15 ENCOUNTER — Ambulatory Visit (INDEPENDENT_AMBULATORY_CARE_PROVIDER_SITE_OTHER): Payer: 59 | Admitting: Obstetrics and Gynecology

## 2018-12-15 ENCOUNTER — Other Ambulatory Visit: Payer: Self-pay

## 2018-12-15 VITALS — BP 107/64 | HR 67 | Ht 67.0 in | Wt 150.5 lb

## 2018-12-15 DIAGNOSIS — Z30430 Encounter for insertion of intrauterine contraceptive device: Secondary | ICD-10-CM

## 2018-12-15 NOTE — Progress Notes (Signed)
Tylene Quashie is a 28 y.o. year old G60P2002 Caucasian female who presents for placement of a Mirena IUD.  No LMP recorded. (Menstrual status: Lactating). BP 107/64   Pulse 67   Ht 5\' 7"  (1.702 m)   Wt 150 lb 8 oz (68.3 kg)   Breastfeeding Yes   BMI 23.57 kg/m  Last sexual intercourse was 3 weeks ago, and pregnancy test today was negative  The risks and benefits of the method and placement have been thouroughly reviewed with the patient and all questions were answered.  Specifically the patient is aware of failure rate of 05/998, expulsion of the IUD and of possible perforation.  The patient is aware of irregular bleeding due to the method and understands the incidence of irregular bleeding diminishes with time.  Signed copy of informed consent in chart.   Time out was performed.  A graves speculum was placed in the vagina.  The cervix was visualized, prepped using Betadine, and grasped with a single tooth tenaculum. The uterus was found to be neutral and it sounded to 8 cm.  Mirena IUD placed per manufacturer's recommendations.   The strings were trimmed to 3 cm.  The patient was given post procedure instructions, including signs and symptoms of infection and to check for the strings after each menses or each month, and refraining from intercourse or anything in the vagina for 3 days.  She was given a Mirena care card with date Mirena placed, and date Mirena to be removed.    Cara Thaxton Rockney Ghee, CNM

## 2018-12-15 NOTE — Patient Instructions (Signed)

## 2019-02-14 ENCOUNTER — Emergency Department: Payer: Medicaid Other

## 2019-02-14 ENCOUNTER — Encounter: Payer: Self-pay | Admitting: Emergency Medicine

## 2019-02-14 ENCOUNTER — Emergency Department
Admission: EM | Admit: 2019-02-14 | Discharge: 2019-02-14 | Disposition: A | Discharge status: Home / Self Care | Payer: Medicaid Other | Attending: Student | Admitting: Student

## 2019-02-14 ENCOUNTER — Other Ambulatory Visit: Payer: Self-pay

## 2019-02-14 DIAGNOSIS — N1330 Unspecified hydronephrosis: Secondary | ICD-10-CM | POA: Insufficient documentation

## 2019-02-14 DIAGNOSIS — Z79899 Other long term (current) drug therapy: Secondary | ICD-10-CM | POA: Diagnosis not present

## 2019-02-14 DIAGNOSIS — Z20828 Contact with and (suspected) exposure to other viral communicable diseases: Secondary | ICD-10-CM | POA: Insufficient documentation

## 2019-02-14 DIAGNOSIS — N200 Calculus of kidney: Secondary | ICD-10-CM | POA: Insufficient documentation

## 2019-02-14 DIAGNOSIS — R109 Unspecified abdominal pain: Secondary | ICD-10-CM

## 2019-02-14 LAB — CBC
HCT: 37.4 % (ref 36.0–46.0)
Hemoglobin: 11.7 g/dL — ABNORMAL LOW (ref 12.0–15.0)
MCH: 25.2 pg — ABNORMAL LOW (ref 26.0–34.0)
MCHC: 31.3 g/dL (ref 30.0–36.0)
MCV: 80.6 fL (ref 80.0–100.0)
Platelets: 228 10*3/uL (ref 150–400)
RBC: 4.64 MIL/uL (ref 3.87–5.11)
RDW: 15.7 % — ABNORMAL HIGH (ref 11.5–15.5)
WBC: 7.1 10*3/uL (ref 4.0–10.5)
nRBC: 0 % (ref 0.0–0.2)

## 2019-02-14 LAB — COMPREHENSIVE METABOLIC PANEL
ALT: 17 U/L (ref 0–44)
AST: 19 U/L (ref 15–41)
Albumin: 4.4 g/dL (ref 3.5–5.0)
Alkaline Phosphatase: 84 U/L (ref 38–126)
Anion gap: 11 (ref 5–15)
BUN: 19 mg/dL (ref 6–20)
CO2: 25 mmol/L (ref 22–32)
Calcium: 9 mg/dL (ref 8.9–10.3)
Chloride: 106 mmol/L (ref 98–111)
Creatinine, Ser: 0.75 mg/dL (ref 0.44–1.00)
GFR calc Af Amer: >60 mL/min (ref >60)
GFR calc non Af Amer: >60 mL/min (ref >60)
Glucose, Bld: 106 mg/dL — ABNORMAL HIGH (ref 70–99)
Potassium: 3.1 mmol/L — ABNORMAL LOW (ref 3.5–5.1)
Sodium: 142 mmol/L (ref 135–145)
Total Bilirubin: 0.5 mg/dL (ref 0.3–1.2)
Total Protein: 7.9 g/dL (ref 6.5–8.1)

## 2019-02-14 LAB — URINALYSIS, COMPLETE (UACMP) WITH MICROSCOPIC
Bacteria, UA: NONE SEEN
Bilirubin Urine: NEGATIVE
Glucose, UA: NEGATIVE mg/dL
Ketones, ur: NEGATIVE mg/dL
Nitrite: NEGATIVE
Protein, ur: NEGATIVE mg/dL
Specific Gravity, Urine: 1.023 (ref 1.005–1.030)
pH: 5 (ref 5.0–8.0)

## 2019-02-14 LAB — LIPASE, BLOOD: Lipase: 31 U/L (ref 11–51)

## 2019-02-14 LAB — SARS CORONAVIRUS 2 BY RT PCR (HOSPITAL ORDER, PERFORMED IN ~~LOC~~ HOSPITAL LAB): SARS Coronavirus 2: NEGATIVE

## 2019-02-14 LAB — POCT PREGNANCY, URINE: Preg Test, Ur: NEGATIVE

## 2019-02-14 MED ORDER — FENTANYL CITRATE (PF) 100 MCG/2ML IJ SOLN
50.0000 ug | Freq: Once | INTRAMUSCULAR | Status: AC
  Administered 2019-02-14: 50 ug via INTRAVENOUS
  Filled 2019-02-14: qty 2

## 2019-02-14 MED ORDER — SODIUM CHLORIDE 0.9% FLUSH
3.0000 mL | Freq: Once | INTRAVENOUS | Status: AC
  Administered 2019-02-14: 3 mL via INTRAVENOUS

## 2019-02-14 MED ORDER — MORPHINE SULFATE (PF) 4 MG/ML IV SOLN
4.0000 mg | Freq: Once | INTRAVENOUS | Status: AC
  Administered 2019-02-14: 4 mg via INTRAVENOUS
  Filled 2019-02-14: qty 1

## 2019-02-14 MED ORDER — OXYCODONE HCL 5 MG PO CAPS: 5.0000 mg | ORAL_CAPSULE | Freq: Four times a day (QID) | ORAL | 0 refills | Status: AC | PRN

## 2019-02-14 MED ORDER — SODIUM CHLORIDE 0.9 % IV BOLUS
1000.0000 mL | Freq: Once | INTRAVENOUS | Status: AC
  Administered 2019-02-14: 17:00:00 1000 mL via INTRAVENOUS

## 2019-02-14 MED ORDER — ONDANSETRON HCL 4 MG PO TABS: 4.0000 mg | ORAL_TABLET | Freq: Three times a day (TID) | ORAL | 0 refills | Status: AC | PRN

## 2019-02-14 MED ORDER — ONDANSETRON HCL 4 MG/2ML IJ SOLN
4.0000 mg | Freq: Once | INTRAMUSCULAR | Status: AC
  Administered 2019-02-14: 4 mg via INTRAVENOUS
  Filled 2019-02-14: qty 2

## 2019-02-14 MED ORDER — IOHEXOL 300 MG/ML  SOLN
100.0000 mL | Freq: Once | INTRAMUSCULAR | Status: AC | PRN
  Administered 2019-02-14: 100 mL via INTRAVENOUS

## 2019-02-14 MED ORDER — KETOROLAC TROMETHAMINE 30 MG/ML IJ SOLN
30.0000 mg | Freq: Once | INTRAMUSCULAR | Status: AC
  Administered 2019-02-14: 30 mg via INTRAVENOUS
  Filled 2019-02-14: qty 1

## 2019-02-14 NOTE — ED Notes (Signed)
Pt off floor to CT

## 2019-02-14 NOTE — ED Provider Notes (Signed)
[facility] Emergency Department Provider Note  ____________________________________________   First MD Initiated Contact with Patient 02/14/19 1649     (approximate)  I have reviewed the triage vital signs and the nursing notes.  History  Chief Complaint Abdominal Pain    HPI Evelyn Caldwell is a 28 y.o. female history of nephrolithiasis, who presents to the emergency department for acute onset of left-sided abdominal pain that started today.  Started around lunchtime.  It is sharp, severe, constant and she localizes it to her left mid abdomen, with some radiation down into the left lower abdomen.  Associated with nausea and vomiting.  Denies any diarrhea or loose stools.  No fevers, dysuria, urinary frequency.  She does report a history of right-sided nephrolithiasis, states this does feel different.  She has an IUD in place, she denies any vaginal bleeding or vaginal discharge.         Past Medical Hx Past Medical History:  Diagnosis Date  . Kidney stone   . Vulvar lesion     Problem List Patient Active Problem List   Diagnosis Date Noted  . Internal hemorrhoids 08/18/2016  . Status post vacuum-assisted vaginal delivery 03/09/2016  . Nephrolithiasis 10/22/2015  . Headache, migraine 01/24/2015    Past Surgical Hx Past Surgical History:  Procedure Laterality Date  . CYSTOSCOPY WITH STENT PLACEMENT Right 07/27/2018   Procedure: CYSTOSCOPY WITH STENT PLACEMENT;  Surgeon: Billey Co, MD;  Location: [facility] ORS;  Service: Urology;  Laterality: Right;  . CYSTOSCOPY/URETEROSCOPY/HOLMIUM LASER/STENT PLACEMENT Right 08/03/2018   Procedure: CYSTOSCOPY/URETEROSCOPY/HOLMIUM LASER/STENT EXCHANGE **WITH NST BEFORE AND AFTER;  Surgeon: [name], MD;  Location: [facility] ORS;  Service: Urology;  Laterality: Right;  . none      Medications Prior to Admission medications   Medication Sig Start Date End Date Taking? Authorizing Provider   acetaminophen (TYLENOL) 500 MG tablet Take 500-1,000 mg by mouth every 6 (six) hours as needed (HEADACHES).     [provider]  Cholecalciferol (VITAMIN D3) 125 MCG (5000 UT) CAPS Take 1 capsule (5,000 Units total) by mouth daily. Patient not taking: Reported on 11/29/2018 10/19/18   Shambley, Melody N, CNM  ibuprofen (ADVIL) 600 MG tablet Take 1 tablet (600 mg total) by mouth every 6 (six) hours. Patient not taking: Reported on 11/29/2018 10/19/18   Shambley, Melody N, CNM  metoCLOPramide (REGLAN) 10 MG tablet Take 1 tablet (10 mg total) by mouth 2 (two) times a day. 12/06/18   Shambley, Melody N, CNM  nitrofurantoin, macrocrystal-monohydrate, (MACROBID) 100 MG capsule Take 100 mg by mouth 2 (two) times daily.    [provider]  norethindrone (MICRONOR) 0.35 MG tablet Take 1 tablet (0.35 mg total) by mouth daily. Patient not taking: Reported on 12/15/2018 11/29/18   Joylene Igo, CNM    Allergies Patient has no known allergies.  Family Hx Family History  Problem Relation Age of Onset  . Congestive Heart Failure Maternal Grandfather   . Parkinson's disease Paternal Grandmother   . Kidney failure Cousin   . Kidney cancer Neg Hx   . Prostate cancer Neg Hx     Social Hx Social History   Tobacco Use  . Smoking status: Never Smoker  . Smokeless tobacco: Never Used  Substance Use Topics  . Alcohol use: No    Comment: occasionally  . Drug use: No     Review of Systems  Constitutional: Negative for fever. Negative for chills. Eyes: Negative for visual changes. ENT: Negative for  sore throat. Cardiovascular: Negative for chest pain. Respiratory: Negative for shortness of breath. Gastrointestinal: + abdominal pain, nausea, vomiting Genitourinary: Negative for dysuria. Musculoskeletal: Negative for leg swelling. Skin: Negative for rash. Neurological: Negative for for headaches.   Physical Exam  Vital Signs: ED Triage Vitals  Enc Vitals Group     BP 02/14/19  1607 (!) 140/95     Pulse Rate 02/14/19 1607 87     Resp 02/14/19 1607 20     Temp 02/14/19 1607 98.4 F (36.9 C)     Temp Source 02/14/19 1607 Oral     SpO2 02/14/19 1607 99 %     Weight 02/14/19 1606 145 lb (65.8 kg)     Height 02/14/19 1606 5\' 8"  (1.727 m)     Head Circumference --      Peak Flow --      Pain Score 02/14/19 1606 9     Pain Loc --      Pain Edu? --      Excl. in [place]? --     Constitutional: Alert and oriented.  Appears uncomfortable. Eyes: Conjunctivae clear. Sclera anicteric. Head: Normocephalic. Atraumatic. Nose: No congestion. No rhinorrhea. Mouth/Throat: Mucous membranes are moist.  Neck: No stridor.   Cardiovascular: Normal rate, regular rhythm. No murmurs. Extremities well perfused. Respiratory: Normal respiratory effort.  Lungs CTAB. Gastrointestinal: Soft, nondistended.  Tender to palpation in the left mid abdomen, left lower quadrant.  Remainder of abdomen is soft and nontender. Musculoskeletal: No lower extremity edema. Neurologic:  Normal speech and language. No gross focal neurologic deficits are appreciated.  Skin: Skin is warm, dry and intact. No rash noted. Psychiatric: Mood and affect are appropriate for situation.  EKG  N/A   Radiology  CT A/P: IMPRESSION: 1. Severe hydronephrosis and ureterectasis to the level of the mid left acetabulum. There is an 8 x 8 x 8 mm calculus in the distal left ureter. 2. Nonobstructing calculi in each kidney. 3. No bowel obstruction. No abscess in the abdomen or pelvis. No periappendiceal region inflammatory change. 4. Small hiatal hernia. 5. Intrauterine device positioned within the endometrium.     Procedures  Procedure(s) performed (including critical care):  Procedures   Initial Impression / Assessment and Plan / ED Course  28 y.o. female who presents to the ED for left-sided abdominal pain, nausea, vomiting, as above.  Ddx: nephrolithiasis, early pyelonephritis, gastritis,  pancreatitis, diverticulitis.   Labs, urine, CT imaging, symptom control and reassess.  CT imaging reveals an 8 mm calculus in the distal left ureter with severe hydronephrosis.  Urine does not appear infectious, sent for culture.  Creatinine within normal limits.    Discussed with urology, patient will likely require lithotripsy.  Given her pain is now adequately controlled, she is stable for discharge home, and will follow-up in the urology clinic tomorrow with possible lithotripsy thereafter.  Updated patient on plan of care, who is agreeable.  She feels comfortable with discharge at this time, symptoms continue to feel improved and are under control.  Oxycodone and Zofran sent to pharmacy as needed for symptom control until her appointment.  Given return precautions.    Final Clinical Impression(s) / ED Diagnosis  Final diagnoses:  Left sided abdominal pain  Nephrolithiasis       Note:  This document was prepared using Dragon voice recognition software and may include unintentional dictation errors.   Lilia Pro., MD 02/14/19 [phone]

## 2019-02-14 NOTE — ED Notes (Signed)
This RN introduced self to pt. Pt heavily guarding LLQ. Pain medicine given. Will reevaluate when pt is back from CT.

## 2019-02-14 NOTE — Discharge Instructions (Signed)
Thank you for letting us take care of you in the emergency department today.   New medications we have prescribed:  - oxycodone - as needed for pain control - Zofran - as needed for nausea/vomiting  NOTHING to eat or drink after midnight.  Please follow up with: - The urology doctor in clinic tomorrow.   Please return to the ER for any new or worsening symptoms.

## 2019-02-14 NOTE — ED Triage Notes (Signed)
Patient also c/o emesis

## 2019-02-14 NOTE — ED Notes (Signed)
Patient transported to CT 

## 2019-02-14 NOTE — ED Notes (Signed)
Pt states pain medicine helped but pt still guarding her LLQ and states her pain is 8/10.

## 2019-02-14 NOTE — ED Triage Notes (Signed)
Pt c/o LLQ pain with N/V that started today, states she has a hx of kidney stones but this feels different.Marland Kitchen

## 2019-02-14 NOTE — ED Notes (Signed)
Pt states pain has came down to below a 5/10.

## 2019-02-15 ENCOUNTER — Ambulatory Visit (INDEPENDENT_AMBULATORY_CARE_PROVIDER_SITE_OTHER): Payer: 59 | Admitting: Urology

## 2019-02-15 ENCOUNTER — Other Ambulatory Visit: Payer: Self-pay

## 2019-02-15 ENCOUNTER — Other Ambulatory Visit: Payer: Self-pay | Admitting: Radiology

## 2019-02-15 ENCOUNTER — Ambulatory Visit
Admission: RE | Admit: 2019-02-15 | Discharge: 2019-02-15 | Disposition: A | Discharge status: Home / Self Care | Payer: Medicaid Other | Attending: Urology | Admitting: Urology

## 2019-02-15 ENCOUNTER — Ambulatory Visit: Payer: Medicaid Other

## 2019-02-15 ENCOUNTER — Encounter
Admission: RE | Disposition: A | Discharge status: Home / Self Care | Payer: Self-pay | Source: Home / Self Care | Attending: Urology

## 2019-02-15 ENCOUNTER — Encounter: Payer: Self-pay | Admitting: Urology

## 2019-02-15 VITALS — BP 135/78 | HR 80 | Ht 68.0 in | Wt 145.0 lb

## 2019-02-15 DIAGNOSIS — N200 Calculus of kidney: Secondary | ICD-10-CM

## 2019-02-15 DIAGNOSIS — N132 Hydronephrosis with renal and ureteral calculous obstruction: Secondary | ICD-10-CM | POA: Insufficient documentation

## 2019-02-15 HISTORY — PX: EXTRACORPOREAL SHOCK WAVE LITHOTRIPSY: SHX1557

## 2019-02-15 SURGERY — LITHOTRIPSY, ESWL
Anesthesia: Moderate Sedation | Laterality: Left

## 2019-02-15 MED ORDER — SODIUM CHLORIDE 0.9 % IV SOLN
1.0000 g | INTRAVENOUS | Status: AC
  Administered 2019-02-15: 1 g via INTRAVENOUS
  Filled 2019-02-15: qty 1

## 2019-02-15 MED ORDER — CIPROFLOXACIN HCL 500 MG PO TABS
500.0000 mg | ORAL_TABLET | ORAL | Status: AC
  Administered 2019-02-15: 13:00:00 500 mg via ORAL

## 2019-02-15 MED ORDER — ONDANSETRON HCL 4 MG/2ML IJ SOLN
4.0000 mg | Freq: Once | INTRAMUSCULAR | Status: AC | PRN
  Administered 2019-02-15: 13:00:00 4 mg via INTRAVENOUS

## 2019-02-15 MED ORDER — DIPHENHYDRAMINE HCL 25 MG PO CAPS
ORAL_CAPSULE | ORAL | Status: AC
  Filled 2019-02-15: qty 1

## 2019-02-15 MED ORDER — ONDANSETRON HCL 4 MG/2ML IJ SOLN
INTRAMUSCULAR | Status: AC
  Administered 2019-02-15: 4 mg via INTRAVENOUS
  Filled 2019-02-15: qty 2

## 2019-02-15 MED ORDER — SODIUM CHLORIDE 0.9 % IV SOLN
INTRAVENOUS | Status: DC
  Administered 2019-02-15: 13:00:00 via INTRAVENOUS

## 2019-02-15 MED ORDER — CIPROFLOXACIN HCL 500 MG PO TABS
ORAL_TABLET | ORAL | Status: AC
  Administered 2019-02-15: 500 mg via ORAL
  Filled 2019-02-15: qty 1

## 2019-02-15 MED ORDER — DIPHENHYDRAMINE HCL 25 MG PO CAPS
25.0000 mg | ORAL_CAPSULE | ORAL | Status: AC
  Administered 2019-02-15: 13:00:00 25 mg via ORAL

## 2019-02-15 MED ORDER — DIPHENHYDRAMINE HCL 25 MG PO CAPS
ORAL_CAPSULE | ORAL | Status: AC
  Administered 2019-02-15: 25 mg via ORAL
  Filled 2019-02-15: qty 1

## 2019-02-15 NOTE — Interval H&P Note (Signed)
History and Physical Interval Note:  02/15/2019 1:24 PM  Evelyn Caldwell  has presented today for surgery, with the diagnosis of Kidney stone.  The various methods of treatment have been discussed with the patient and family. After consideration of risks, benefits and other options for treatment, the patient has consented to  Procedure(s): EXTRACORPOREAL SHOCK WAVE LITHOTRIPSY (ESWL) (Left) as a surgical intervention.  The patient's history has been reviewed, patient examined, no change in status, stable for surgery.  I have reviewed the patient's chart and labs.  Questions were answered to the patient's satisfaction.    RRR CTAB  [name]

## 2019-02-15 NOTE — Progress Notes (Signed)
02/15/19 9:09 AM   Evelyn Caldwell [birth year] [id number]  CC: Left flank and groin pain  HPI: I saw [name] in urology clinic as an add-on for flank pain with a kidney stone today.  She is a healthy 28 year old female well-known to urology, she had a prior large 1 cm right distal ureteral stone that was infected during a pregnancy in February 2020 that required urgent stent placement and follow-up ureteroscopy, laser lithotripsy, stent placement.  She tolerated her stent well from that procedure.  She presented to the ED last night with acute onset of 123456 severe left colicky flank and groin pain.  CT scan showed a left 8 mm distal ureteral stone with upstream hydronephrosis.  There were some scattered small stones in the left kidney as well.  Urinalysis was benign appearing with 11-20 RBCs, 11-20 WBCs, no bacteria, nitrite negative.  Urine was sent for culture.  She denies any fevers, chills, dysuria, shortness of breath, or chest pain.  There are no aggravating factors.  Severity is moderate.   PMH: Past Medical History:  Diagnosis Date  . Kidney stone   . Vulvar lesion     Surgical History: February 2020 right ureteral stent placement and follow-up right ureteroscopy, laser lithotripsy, stent  Allergies: No Known Allergies  Family History: Family History  Problem Relation Age of Onset  . Congestive Heart Failure Maternal Grandfather   . Parkinson's disease Paternal Grandmother   . Kidney failure Cousin   . Kidney cancer Neg Hx   . Prostate cancer Neg Hx     Social History:  reports that she has never smoked. She has never used smokeless tobacco. She reports that she does not drink alcohol or use drugs.  ROS: Please see flowsheet from today's date for complete review of systems.  Physical Exam: BP 135/78   Pulse 80   Ht 5\' 8"  (1.727 m)   Wt 145 lb (65.8 kg)   BMI 22.05 kg/m    Constitutional:  Alert and oriented, No acute distress. Cardiovascular:  Regular rate and rhythm Respiratory: Clear to auscultation bilaterally GI: Abdomen is soft, nontender, nondistended, no abdominal masses GU: Left CVA tenderness Lymph: No cervical or inguinal lymphadenopathy. Skin: No rashes, bruises or suspicious lesions. Neurologic: Grossly intact, no focal deficits, moving all 4 extremities. Psychiatric: Normal mood and affect.  Laboratory Data: See HPI  Pertinent Imaging: I have personally reviewed the CT dated 02/14/2019.  8 mm left distal ureteral stone with upstream hydronephrosis, 930HU, 10cm SSD, clearly seen on scout CT.  Assessment & Plan:   In summary, the patient is a healthy 28 year old female with acute onset of 24 hours of left flank and groin pain secondary to an 8 mm left distal ureteral stone.  She has no clinical or laboratory signs of infection.  We discussed various treatment options for urolithiasis including observation with or without medical expulsive therapy, shockwave lithotripsy (SWL), ureteroscopy and laser lithotripsy with stent placement, and percutaneous nephrolithotomy.  We discussed that management is based on stone size, location, density, patient co-morbidities, and patient preference.   Stones <52mm in size have a >80% spontaneous passage rate. Data surrounding the use of tamsulosin for medical expulsive therapy is controversial, but meta analyses suggests it is most efficacious for distal stones between 5-38mm in size. Possible side effects include dizziness/lightheadedness, and retrograde ejaculation.  SWL has a lower stone free rate in a single procedure, but also a lower complication rate compared to ureteroscopy and avoids a stent and associated  stent related symptoms. Possible complications include renal hematoma, steinstrasse, and need for additional treatment.  Ureteroscopy with laser lithotripsy and stent placement has a higher stone free rate than SWL in a single procedure, however increased complication rate  including possible infection, ureteral injury, bleeding, and stent related morbidity. Common stent related symptoms include dysuria, urgency/frequency, and flank pain.  After an extensive discussion of the risks and benefits of the above treatment options, the patient would like to proceed with left shockwave lithotripsy.  Left shockwave lithotripsy today with Dr. Erlene Quan Ampicillin/Cipro pre-op antibiotics with history of enterococcus Recommend metabolic work-up with 123456 urine after acute stone episode   Billey Co, [place] [address] [place], [place] [address] [phone]

## 2019-02-15 NOTE — Patient Instructions (Signed)
Lithotripsy  Lithotripsy is a treatment that can sometimes help eliminate kidney stones and the pain that they cause. A form of lithotripsy, also known as extracorporeal shock wave lithotripsy, is a nonsurgical procedure that crushes a kidney stone with shock waves. These shock waves pass through your body and focus on the kidney stone. They cause the kidney stone to break up while it is still in the urinary tract. This makes it easier for the smaller pieces of stone to pass in the urine. Tell a health care provider about:  Any allergies you have.  All medicines you are taking, including vitamins, herbs, eye drops, creams, and over-the-counter medicines.  Any blood disorders you have.  Any surgeries you have had.  Any medical conditions you have.  Whether you are pregnant or may be pregnant.  Any problems you or family members have had with anesthetic medicines. What are the risks? Generally, this is a safe procedure. However, problems may occur, including:  Infection.  Bleeding of the kidney.  Bruising of the kidney or skin.  Scarring of the kidney, which can lead to: ? Increased blood pressure. ? Poor kidney function. ? Return (recurrence) of kidney stones.  Damage to other structures or organs, such as the liver, colon, spleen, or pancreas.  Blockage (obstruction) of the the tube that carries urine from the kidney to the bladder (ureter).  Failure of the kidney stone to break into pieces (fragments). What happens before the procedure? Staying hydrated Follow instructions from your health care provider about hydration, which may include:  Up to 2 hours before the procedure - you may continue to drink clear liquids, such as water, clear fruit juice, black coffee, and plain tea. Eating and drinking restrictions Follow instructions from your health care provider about eating and drinking, which may include:  8 hours before the procedure - stop eating heavy meals or foods  such as meat, fried foods, or fatty foods.  6 hours before the procedure - stop eating light meals or foods, such as toast or cereal.  6 hours before the procedure - stop drinking milk or drinks that contain milk.  2 hours before the procedure - stop drinking clear liquids. General instructions  Plan to have someone take you home from the hospital or clinic.  Ask your health care provider about: ? Changing or stopping your regular medicines. This is especially important if you are taking diabetes medicines or blood thinners. ? Taking medicines such as aspirin and ibuprofen. These medicines and other NSAIDs can thin your blood. Do not take these medicines for 7 days before your procedure if your health care provider instructs you not to.  You may have tests, such as: ? Blood tests. ? Urine tests. ? Imaging tests, such as a CT scan. What happens during the procedure?  To lower your risk of infection: ? Your health care team will wash or sanitize their hands. ? Your skin will be washed with soap.  An IV tube will be inserted into one of your veins. This tube will give you fluids and medicines.  You will be given one or more of the following: ? A medicine to help you relax (sedative). ? A medicine to make you fall asleep (general anesthetic).  A water-filled cushion may be placed behind your kidney or on your abdomen. In some cases you may be placed in a tub of lukewarm water.  Your body will be positioned in a way that makes it easy to target the kidney   stone.  A flexible tube with holes in it (stent) may be placed in the ureter. This will help keep urine flowing from the kidney if the fragments of the stone have been blocking the ureter.  An X-ray or ultrasound exam will be done to locate your stone.  Shock waves will be aimed at the stone. If you are awake, you may feel a tapping sensation as the shock waves pass through your body. The procedure may vary among health care  providers and hospitals. What happens after the procedure?  You may have an X-ray to see whether the procedure was able to break up the kidney stone and how much of the stone has passed. If large stone fragments remain after treatment, you may need to have a second procedure at a later time.  Your blood pressure, heart rate, breathing rate, and blood oxygen level will be monitored until the medicines you were given have worn off.  You may be given antibiotics or pain medicine as needed.  If a stent was placed in your ureter during surgery, it may stay in place for a few weeks.  You may need strain your urine to collect pieces of the kidney stone for testing.  You will need to drink plenty of water.  Do not drive for 24 hours if you were given a sedative. Summary  Lithotripsy is a treatment that can sometimes help eliminate kidney stones and the pain that they cause.  A form of lithotripsy, also known as extracorporeal shock wave lithotripsy, is a nonsurgical procedure that crushes a kidney stone with shock waves.  Generally, this is a safe procedure. However, problems may occur, including damage to the kidney or other organs, infection, or obstruction of the tube that carries urine from the kidney to the bladder (ureter).  When you go home, you will need to drink plenty of water. You may be asked to strain your urine to collect pieces of the kidney stone for testing. This information is not intended to replace advice given to you by your health care provider. Make sure you discuss any questions you have with your health care provider. Document Released: 05/14/2000 Document Revised: 08/28/2018 Document Reviewed: 04/07/2016 Elsevier Patient Education  2020 Elsevier Inc.  

## 2019-02-15 NOTE — Discharge Instructions (Signed)
See [facility] discharge instructions in chart.  

## 2019-02-15 NOTE — Interval H&P Note (Signed)
History and Physical Interval Note:  02/15/2019 2:02 PM  Evelyn Caldwell  has presented today for surgery, with the diagnosis of Kidney stone.  The various methods of treatment have been discussed with the patient and family. After consideration of risks, benefits and other options for treatment, the patient has consented to  Procedure(s): EXTRACORPOREAL SHOCK WAVE LITHOTRIPSY (ESWL) (Left) as a surgical intervention.  The patient's history has been reviewed, patient examined, no change in status, stable for surgery.  I have reviewed the patient's chart and labs.  Questions were answered to the patient's satisfaction.    [facility] CTAB  [name]

## 2019-02-15 NOTE — H&P (View-Only) (Signed)
02/15/19 9:09 AM   Evelyn Caldwell [birth year] [id number]  CC: Left flank and groin pain  HPI: I saw [name] in urology clinic as an add-on for flank pain with a kidney stone today.  She is a healthy 28 year old female well-known to urology, she had a prior large 1 cm right distal ureteral stone that was infected during a pregnancy in February 2020 that required urgent stent placement and follow-up ureteroscopy, laser lithotripsy, stent placement.  She tolerated her stent well from that procedure.  She presented to the ED last night with acute onset of 123456 severe left colicky flank and groin pain.  CT scan showed a left 8 mm distal ureteral stone with upstream hydronephrosis.  There were some scattered small stones in the left kidney as well.  Urinalysis was benign appearing with 11-20 RBCs, 11-20 WBCs, no bacteria, nitrite negative.  Urine was sent for culture.  She denies any fevers, chills, dysuria, shortness of breath, or chest pain.  There are no aggravating factors.  Severity is moderate.   PMH: Past Medical History:  Diagnosis Date  . Kidney stone   . Vulvar lesion     Surgical History: February 2020 right ureteral stent placement and follow-up right ureteroscopy, laser lithotripsy, stent  Allergies: No Known Allergies  Family History: Family History  Problem Relation Age of Onset  . Congestive Heart Failure Maternal Grandfather   . Parkinson's disease Paternal Grandmother   . Kidney failure Cousin   . Kidney cancer Neg Hx   . Prostate cancer Neg Hx     Social History:  reports that she has never smoked. She has never used smokeless tobacco. She reports that she does not drink alcohol or use drugs.  ROS: Please see flowsheet from today's date for complete review of systems.  Physical Exam: BP 135/78   Pulse 80   Ht 5\' 8"  (1.727 m)   Wt 145 lb (65.8 kg)   BMI 22.05 kg/m    Constitutional:  Alert and oriented, No acute distress. Cardiovascular:  Regular rate and rhythm Respiratory: Clear to auscultation bilaterally GI: Abdomen is soft, nontender, nondistended, no abdominal masses GU: Left CVA tenderness Lymph: No cervical or inguinal lymphadenopathy. Skin: No rashes, bruises or suspicious lesions. Neurologic: Grossly intact, no focal deficits, moving all 4 extremities. Psychiatric: Normal mood and affect.  Laboratory Data: See HPI  Pertinent Imaging: I have personally reviewed the CT dated 02/14/2019.  8 mm left distal ureteral stone with upstream hydronephrosis, 930HU, 10cm SSD, clearly seen on scout CT.  Assessment & Plan:   In summary, the patient is a healthy 28 year old female with acute onset of 24 hours of left flank and groin pain secondary to an 8 mm left distal ureteral stone.  She has no clinical or laboratory signs of infection.  We discussed various treatment options for urolithiasis including observation with or without medical expulsive therapy, shockwave lithotripsy (SWL), ureteroscopy and laser lithotripsy with stent placement, and percutaneous nephrolithotomy.  We discussed that management is based on stone size, location, density, patient co-morbidities, and patient preference.   Stones <16mm in size have a >80% spontaneous passage rate. Data surrounding the use of tamsulosin for medical expulsive therapy is controversial, but meta analyses suggests it is most efficacious for distal stones between 5-71mm in size. Possible side effects include dizziness/lightheadedness, and retrograde ejaculation.  SWL has a lower stone free rate in a single procedure, but also a lower complication rate compared to ureteroscopy and avoids a stent and associated  stent related symptoms. Possible complications include renal hematoma, steinstrasse, and need for additional treatment.  Ureteroscopy with laser lithotripsy and stent placement has a higher stone free rate than SWL in a single procedure, however increased complication rate  including possible infection, ureteral injury, bleeding, and stent related morbidity. Common stent related symptoms include dysuria, urgency/frequency, and flank pain.  After an extensive discussion of the risks and benefits of the above treatment options, the patient would like to proceed with left shockwave lithotripsy.  Left shockwave lithotripsy today with Dr. Erlene Quan Ampicillin/Cipro pre-op antibiotics with history of enterococcus Recommend metabolic work-up with 123456 urine after acute stone episode   Billey Co, [place] [address] [place], [place] [address] [phone]

## 2019-02-16 LAB — URINE CULTURE: Culture: <10000 — AB

## 2019-03-01 ENCOUNTER — Encounter: Payer: Self-pay | Admitting: Urology

## 2019-03-01 ENCOUNTER — Encounter: Payer: 59 | Admitting: Obstetrics and Gynecology

## 2019-03-01 ENCOUNTER — Ambulatory Visit
Admission: RE | Admit: 2019-03-01 | Discharge: 2019-03-01 | Disposition: A | Discharge status: Home / Self Care | Payer: Medicaid Other | Source: Ambulatory Visit | Attending: Urology | Admitting: Urology

## 2019-03-01 ENCOUNTER — Ambulatory Visit (INDEPENDENT_AMBULATORY_CARE_PROVIDER_SITE_OTHER): Payer: 59 | Admitting: Urology

## 2019-03-01 ENCOUNTER — Other Ambulatory Visit: Payer: Self-pay

## 2019-03-01 VITALS — BP 144/79 | HR 99 | Ht 68.0 in | Wt 144.0 lb

## 2019-03-01 DIAGNOSIS — N200 Calculus of kidney: Secondary | ICD-10-CM | POA: Insufficient documentation

## 2019-03-01 DIAGNOSIS — N201 Calculus of ureter: Secondary | ICD-10-CM

## 2019-03-01 NOTE — Progress Notes (Signed)
03/01/2019 10:12 AM   Evelyn Caldwell [facility] March 28, 1991 [id number]  Referring provider: No referring provider defined for this encounter.  Chief Complaint  Patient presents with  . Nephrolithiasis    HPI: 28 year old female with a history of recurrent nephrolithiasis returns today 2 weeks status post ESWL for treatment of a 9 mm left distal ureteral calculus.  Procedure was uncomplicated.  Was well-tolerated.  She reports that she passed a fairly large fragment the same day as the procedure and then a few smaller fragments the following morning.  KUB today shows no residual left ureteral stone remaining.  She brings there multiple fragments today.  Previous stone composition 30% calcium oxalate monohydrate, 50% calcium oxalate dihydrate, 20% hydroxyapatite.  Notably, she was treated for her once of a right distal ureteral calculus during her pregnancy as recently as 07/2018.  Her initial presentation was urosepsis requiring stenting followed by staged procedure after her infection was treated.  She reports that she is had several stones during pregnancy but none in the absence of pregnancy until this 1.   PMH: Past Medical History:  Diagnosis Date  . Kidney stone   . Vulvar lesion     Surgical History: Past Surgical History:  Procedure Laterality Date  . CYSTOSCOPY WITH STENT PLACEMENT Right 07/27/2018   Procedure: CYSTOSCOPY WITH STENT PLACEMENT;  Surgeon: Billey Co, MD;  Location: [facility] ORS;  Service: Urology;  Laterality: Right;  . CYSTOSCOPY/URETEROSCOPY/HOLMIUM LASER/STENT PLACEMENT Right 08/03/2018   Procedure: CYSTOSCOPY/URETEROSCOPY/HOLMIUM LASER/STENT EXCHANGE **WITH NST BEFORE AND AFTER;  Surgeon: [name], MD;  Location: [facility] ORS;  Service: Urology;  Laterality: Right;  . EXTRACORPOREAL SHOCK WAVE LITHOTRIPSY Left 02/15/2019   Procedure: EXTRACORPOREAL SHOCK WAVE LITHOTRIPSY (ESWL);  Surgeon: [name], MD;  Location: [facility] ORS;  Service:  Urology;  Laterality: Left;  . none      Home Medications:  Allergies as of 03/01/2019   No Known Allergies     Medication List       Accurate as of March 01, 2019 10:12 AM. If you have any questions, ask your nurse or doctor.        STOP taking these medications   Macrobid 100 MG capsule Generic drug: nitrofurantoin (macrocrystal-monohydrate) Stopped by: [name], MD     TAKE these medications   acetaminophen 500 MG tablet Commonly known as: TYLENOL Take 500-1,000 mg by mouth every 6 (six) hours as needed (HEADACHES).   ibuprofen 600 MG tablet Commonly known as: ADVIL Take 1 tablet (600 mg total) by mouth every 6 (six) hours.   metoCLOPramide 10 MG tablet Commonly known as: REGLAN Take 1 tablet (10 mg total) by mouth 2 (two) times a day.   norethindrone 0.35 MG tablet Commonly known as: MICRONOR Take 1 tablet (0.35 mg total) by mouth daily.   Vitamin D3 125 MCG (5000 UT) Caps Take 1 capsule (5,000 Units total) by mouth daily.       Allergies: No Known Allergies  Family History: Family History  Problem Relation Age of Onset  . Congestive Heart Failure Maternal Grandfather   . Parkinson's disease Paternal Grandmother   . Kidney failure Cousin   . Kidney cancer Neg Hx   . Prostate cancer Neg Hx     Social History:  reports that she has never smoked. She has never used smokeless tobacco. She reports that she does not drink alcohol or use drugs.  ROS: UROLOGY Frequent Urination?: No Hard to postpone urination?: No Burning/pain with urination?: No Get up at night  to urinate?: No Leakage of urine?: No Urine stream starts and stops?: No Trouble starting stream?: No Do you have to strain to urinate?: No Blood in urine?: No Urinary tract infection?: No Sexually transmitted disease?: No Injury to kidneys or bladder?: No Painful intercourse?: No Weak stream?: No Currently pregnant?: No Vaginal bleeding?: No Last menstrual period?: n   Gastrointestinal Nausea?: No Vomiting?: No Indigestion/heartburn?: No Diarrhea?: No Constipation?: No  Constitutional Fever: No Night sweats?: No Weight loss?: No Fatigue?: No  Skin Skin rash/lesions?: No Itching?: No  Eyes Blurred vision?: No Double vision?: No  Ears/Nose/Throat Sore throat?: No Sinus problems?: No  Hematologic/Lymphatic Swollen glands?: No Easy bruising?: No  Cardiovascular Leg swelling?: No Chest pain?: No  Respiratory Cough?: No Shortness of breath?: No  Endocrine Excessive thirst?: No  Musculoskeletal Back pain?: No Joint pain?: No  Neurological Headaches?: No Dizziness?: No  Psychologic Depression?: No Anxiety?: No  Physical Exam: BP (!) 144/79   Pulse 99   Ht 5\' 8"  (1.727 m)   Wt 144 lb (65.3 kg)   BMI 21.90 kg/m   Constitutional:  Alert and oriented, No acute distress. HEENT: [place] AT, moist mucus membranes.  Trachea midline, no masses. Cardiovascular: No clubbing, cyanosis, or edema. Respiratory: Normal respiratory effort, no increased work of breathing. Skin: No rashes, bruises or suspicious lesions. Neurologic: Grossly intact, no focal deficits, moving all 4 extremities. Psychiatric: Normal mood and affect.  Laboratory Data: Lab Results  Component Value Date   WBC 7.1 02/14/2019   HGB 11.7 (L) 02/14/2019   HCT 37.4 02/14/2019   MCV 80.6 02/14/2019   PLT 228 02/14/2019    Lab Results  Component Value Date   CREATININE 0.75 02/14/2019    Lab Results  Component Value Date   HGBA1C 5.0 07/29/2015    Pertinent Imaging: KUB was personally reviewed today, left distal ureteral calculus is no longer visualized  Assessment & Plan:    1. 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 Recurrent nephrolithiasis We discussed given the frequency and  number of her stones, would highly recommend 123456 urine metabolic evaluation to give her more specific recommendations on stone prevention.  She is agreeable this plan.    Return in about 2 months (around 05/01/2019) for PA to review litholink.  [name], MD  [facility] [address] [place], [place] [address] [phone]

## 2019-03-01 NOTE — Patient Instructions (Signed)

## 2019-03-30 ENCOUNTER — Encounter: Payer: 59 | Admitting: Obstetrics and Gynecology

## 2019-04-13 ENCOUNTER — Encounter: Payer: Self-pay | Admitting: Urology

## 2019-04-15 ENCOUNTER — Emergency Department
Admission: EM | Admit: 2019-04-15 | Discharge: 2019-04-15 | Disposition: A | Discharge status: Home / Self Care | Payer: Medicaid Other | Attending: Emergency Medicine | Admitting: Emergency Medicine

## 2019-04-15 ENCOUNTER — Encounter: Payer: Self-pay | Admitting: Emergency Medicine

## 2019-04-15 ENCOUNTER — Emergency Department: Payer: Medicaid Other

## 2019-04-15 ENCOUNTER — Other Ambulatory Visit: Payer: Self-pay

## 2019-04-15 DIAGNOSIS — N131 Hydronephrosis with ureteral stricture, not elsewhere classified: Secondary | ICD-10-CM | POA: Insufficient documentation

## 2019-04-15 DIAGNOSIS — R1032 Left lower quadrant pain: Secondary | ICD-10-CM | POA: Diagnosis present

## 2019-04-15 DIAGNOSIS — N200 Calculus of kidney: Secondary | ICD-10-CM

## 2019-04-15 LAB — CBC
HCT: 37.6 % (ref 36.0–46.0)
Hemoglobin: 12.1 g/dL (ref 12.0–15.0)
MCH: 26.4 pg (ref 26.0–34.0)
MCHC: 32.2 g/dL (ref 30.0–36.0)
MCV: 82.1 fL (ref 80.0–100.0)
Platelets: 196 10*3/uL (ref 150–400)
RBC: 4.58 MIL/uL (ref 3.87–5.11)
RDW: 14.5 % (ref 11.5–15.5)
WBC: 8.1 10*3/uL (ref 4.0–10.5)
nRBC: 0 % (ref 0.0–0.2)

## 2019-04-15 LAB — COMPREHENSIVE METABOLIC PANEL
ALT: 14 U/L (ref 0–44)
AST: 17 U/L (ref 15–41)
Albumin: 4.3 g/dL (ref 3.5–5.0)
Alkaline Phosphatase: 77 U/L (ref 38–126)
Anion gap: 10 (ref 5–15)
BUN: 23 mg/dL — ABNORMAL HIGH (ref 6–20)
CO2: 25 mmol/L (ref 22–32)
Calcium: 9 mg/dL (ref 8.9–10.3)
Chloride: 109 mmol/L (ref 98–111)
Creatinine, Ser: 0.65 mg/dL (ref 0.44–1.00)
GFR calc Af Amer: >60 mL/min (ref >60)
GFR calc non Af Amer: >60 mL/min (ref >60)
Glucose, Bld: 134 mg/dL — ABNORMAL HIGH (ref 70–99)
Potassium: 4 mmol/L (ref 3.5–5.1)
Sodium: 144 mmol/L (ref 135–145)
Total Bilirubin: 0.7 mg/dL (ref 0.3–1.2)
Total Protein: 8 g/dL (ref 6.5–8.1)

## 2019-04-15 LAB — URINALYSIS, COMPLETE (UACMP) WITH MICROSCOPIC
Bilirubin Urine: NEGATIVE
Glucose, UA: NEGATIVE mg/dL
Ketones, ur: 5 mg/dL — AB
Leukocytes,Ua: NEGATIVE
Nitrite: NEGATIVE
Protein, ur: 30 mg/dL — AB
RBC / HPF: >50 RBC/hpf — ABNORMAL HIGH (ref 0–5)
Specific Gravity, Urine: 1.021 (ref 1.005–1.030)
pH: 7 (ref 5.0–8.0)

## 2019-04-15 LAB — POCT PREGNANCY, URINE: Preg Test, Ur: NEGATIVE

## 2019-04-15 MED ORDER — MORPHINE SULFATE (PF) 4 MG/ML IV SOLN
4.0000 mg | Freq: Once | INTRAVENOUS | Status: AC
  Administered 2019-04-15: 4 mg via INTRAVENOUS
  Filled 2019-04-15: qty 1

## 2019-04-15 MED ORDER — SODIUM CHLORIDE 0.9 % IV BOLUS
1000.0000 mL | Freq: Once | INTRAVENOUS | Status: AC
  Administered 2019-04-15: 1000 mL via INTRAVENOUS

## 2019-04-15 MED ORDER — MORPHINE SULFATE (PF) 4 MG/ML IV SOLN
4.0000 mg | Freq: Once | INTRAVENOUS | Status: AC
  Administered 2019-04-15: 06:00:00 4 mg via INTRAVENOUS

## 2019-04-15 MED ORDER — FENTANYL CITRATE (PF) 100 MCG/2ML IJ SOLN: 50.0000 ug | INTRAMUSCULAR | Status: DC | PRN

## 2019-04-15 MED ORDER — OXYCODONE-ACETAMINOPHEN 7.5-325 MG PO TABS: 1.0000 | ORAL_TABLET | ORAL | 0 refills | Status: DC | PRN

## 2019-04-15 MED ORDER — ONDANSETRON HCL 4 MG/2ML IJ SOLN
4.0000 mg | Freq: Once | INTRAMUSCULAR | Status: AC
  Administered 2019-04-15: 4 mg via INTRAVENOUS
  Filled 2019-04-15: qty 2

## 2019-04-15 MED ORDER — TAMSULOSIN HCL 0.4 MG PO CAPS: 0.4000 mg | ORAL_CAPSULE | Freq: Every day | ORAL | 0 refills | Status: DC

## 2019-04-15 MED ORDER — MORPHINE SULFATE (PF) 4 MG/ML IV SOLN
INTRAVENOUS | Status: AC
  Administered 2019-04-15: 4 mg via INTRAVENOUS
  Filled 2019-04-15: qty 1

## 2019-04-15 MED ORDER — KETOROLAC TROMETHAMINE 30 MG/ML IJ SOLN
30.0000 mg | Freq: Once | INTRAMUSCULAR | Status: AC
  Administered 2019-04-15: 30 mg via INTRAVENOUS
  Filled 2019-04-15: qty 1

## 2019-04-15 MED ORDER — ONDANSETRON HCL 4 MG/2ML IJ SOLN: 4.0000 mg | Freq: Once | INTRAMUSCULAR | Status: DC | PRN

## 2019-04-15 MED ORDER — SODIUM CHLORIDE 0.9% FLUSH
3.0000 mL | Freq: Once | INTRAVENOUS | Status: AC
  Administered 2019-04-15: 3 mL via INTRAVENOUS

## 2019-04-15 NOTE — ED Notes (Signed)
Pt verbalized understanding of discharge instructions. NAD at this time. 

## 2019-04-15 NOTE — ED Provider Notes (Signed)
[facility] Emergency Department Provider Note _____   First MD Initiated Contact with Patient 04/15/19 0535     (approximate)  I have reviewed the triage vital signs and the nursing notes.   HISTORY  Chief Complaint Abdominal Pain    HPI Evelyn Caldwell is a 28 y.o. female with below list of previous medical conditions including multiple kidney stones presents to the emergency department secondary to 10 out of 10 left flank pain which began at 12:30 AM.  Patient admits to nausea and vomiting as well.  Patient denies any fever.        Past Medical History:  Diagnosis Date   Kidney stone    Vulvar lesion     Patient Active Problem List   Diagnosis Date Noted   Internal hemorrhoids 08/18/2016   Status post vacuum-assisted vaginal delivery 03/09/2016   Nephrolithiasis 10/22/2015   Headache, migraine 01/24/2015     Prior to Admission medications   Medication Sig Start Date End Date Taking? Authorizing Provider  acetaminophen (TYLENOL) 500 MG tablet Take 500-1,000 mg by mouth every 6 (six) hours as needed (HEADACHES).     [provider]  Cholecalciferol (VITAMIN D3) 125 MCG (5000 UT) CAPS Take 1 capsule (5,000 Units total) by mouth daily. 10/19/18   Shambley, Melody N, CNM  ibuprofen (ADVIL) 600 MG tablet Take 1 tablet (600 mg total) by mouth every 6 (six) hours. 10/19/18   Shambley, Melody N, CNM  metoCLOPramide (REGLAN) 10 MG tablet Take 1 tablet (10 mg total) by mouth 2 (two) times a day. 12/06/18   Shambley, Melody N, CNM  norethindrone (MICRONOR) 0.35 MG tablet Take 1 tablet (0.35 mg total) by mouth daily. 11/29/18   Joylene Igo, CNM    Allergies Patient has no known allergies.  Family History  Problem Relation Age of Onset   Congestive Heart Failure Maternal Grandfather    Parkinson's disease Paternal Grandmother    Kidney failure Cousin    Kidney cancer Neg Hx    Prostate cancer Neg Hx     Social  History Social History   Tobacco Use   Smoking status: Never Smoker   Smokeless tobacco: Never Used  Substance Use Topics   Alcohol use: No    Comment: occasionally   Drug use: No    Review of Systems Constitutional: No fever/chills Eyes: No visual changes. ENT: No sore throat. Cardiovascular: Denies chest pain. Respiratory: Denies shortness of breath. Gastrointestinal: Positive for left flank pain.  No nausea, no vomiting.  No diarrhea.  No constipation. Genitourinary: Negative for dysuria. Musculoskeletal: Negative for neck pain.  Negative for back pain. Integumentary: Negative for rash. Neurological: Negative for headaches, focal weakness or numbness.   ____________________________________________   PHYSICAL EXAM:  VITAL SIGNS: ED Triage Vitals  Enc Vitals Group     BP 04/15/19 0337 127/79     Pulse Rate 04/15/19 0337 82     Resp 04/15/19 0337 18     Temp 04/15/19 0337 97.7 F (36.5 C)     Temp Source 04/15/19 0337 Oral     SpO2 04/15/19 0337 98 %     Weight 04/15/19 0338 63.5 kg (140 lb)     Height 04/15/19 0338 1.702 m (5\' 7" )     Head Circumference --      Peak Flow --      Pain Score 04/15/19 0338 10     Pain Loc --      Pain Edu? --  Excl. in [place]? --     Constitutional: Alert and oriented.  Apparent discomfort  eyes: Conjunctivae are normal.  Mouth/Throat: Patient is wearing a mask. Neck: No stridor.  No meningeal signs.   Cardiovascular: Normal rate, regular rhythm. Good peripheral circulation. Grossly normal heart sounds. Respiratory: Normal respiratory effort.  No retractions. Gastrointestinal: Soft and nontender. No distention.  Musculoskeletal: No lower extremity tenderness nor edema. No gross deformities of extremities. Neurologic:  Normal speech and language. No gross focal neurologic deficits are appreciated.  Skin:  Skin is warm, dry and intact. Psychiatric: Mood and affect are normal. Speech and behavior are  normal.    RADIOLOGY I, [place], personally viewed and evaluated these images (plain radiographs) as part of my medical decision making, as well as reviewing the written report by the radiologist.  ED MD interpretation:  Left-sided hydronephrosis secondary to 6 mm distal left ureteral stone.  Official radiology report(s): Ct Renal Stone Study  Result Date: 04/15/2019 CLINICAL DATA:  Abdominal pain. EXAM: CT ABDOMEN AND PELVIS WITHOUT CONTRAST TECHNIQUE: Multidetector CT imaging of the abdomen and pelvis was performed following the standard protocol without IV contrast. COMPARISON:  02/14/2019 FINDINGS: Lower chest: No acute abnormality. Hepatobiliary: Imaged portions of the liver are unremarkable. Gallbladder appears normal. No biliary dilatation. Pancreas: Unremarkable. No pancreatic ductal dilatation or surrounding inflammatory changes. Spleen: Normal in size without focal abnormality. Adrenals/Urinary Tract: Normal adrenal glands. Mild right-sided medullary calcinosis identified. Multiple right renal calculi are identified. The largest stone is in the inferior pole measuring 6 mm. There is no right-sided hydronephrosis, hydroureter or ureteral lithiasis. Asymmetric left-sided nephromegaly and mild perinephric edema. Persistent left-sided hydronephrosis is identified. Within the distal left ureter there is a stone measuring 6 mm. Bladder unremarkable. Stomach/Bowel: Stomach is within normal limits. Appendix not confidently identified. No evidence of bowel wall thickening, distention, or inflammatory changes. Vascular/Lymphatic: No significant vascular findings are present. No enlarged abdominal or pelvic lymph nodes. Reproductive: IUD in situ. Uterus and adnexal structures otherwise unremarkable. Other: No abdominal wall hernia or abnormality. No abdominopelvic ascites. Musculoskeletal: No acute or significant osseous findings. IMPRESSION: 1. Left-sided hydronephrosis secondary to 6 mm  distal left ureteral calculus. 2. Right-sided nephrolithiasis. Electronically Signed   By: Kerby Moors M.D.   On: 04/15/2019 04:32      Procedures   ____________________________________________   INITIAL IMPRESSION / MDM / ASSESSMENT AND PLAN / ED COURSE  As part of my medical decision making, I reviewed the following data within the electronic MEDICAL RECORD NUMBER 28 year old female presented with above-stated history and physical exam secondary to symptoms consistent with kidney stone.  Patient given IV morphine 4 mg with improvement of pain however pain subsequently recurred requiring additional 4 mg of IV morphine.  CT scan revealed a 6 mm distal left ureter stone.  Awaiting urinalysis and pain control at this time.  Patient's care transferred to Dr. Jimmye Norman __________________________________  FINAL CLINICAL IMPRESSION(S) / ED DIAGNOSES  Final diagnoses:  Kidney stone on left side     MEDICATIONS GIVEN DURING THIS VISIT:  Medications  sodium chloride flush (NS) 0.9 % injection 3 mL (3 mLs Intravenous Given 04/15/19 0401)  morphine 4 MG/ML injection 4 mg (4 mg Intravenous Given 04/15/19 0400)  ondansetron (ZOFRAN) injection 4 mg (4 mg Intravenous Given 04/15/19 0359)  morphine 4 MG/ML injection 4 mg (4 mg Intravenous Given 04/15/19 0537)  sodium chloride 0.9 % bolus 1,000 mL (1,000 mLs Intravenous New Bag/Given 04/15/19 0537)  morphine 4 MG/ML injection 4  mg (4 mg Intravenous Given 04/15/19 [id number])     ED Discharge Orders    None      *Please note:  Evelyn Caldwell was evaluated in Emergency Department on 04/15/2019 for the symptoms described in the history of present illness. She was evaluated in the context of the global COVID-19 pandemic, which necessitated consideration that the patient might be at risk for infection with the SARS-CoV-2 virus that causes COVID-19. Institutional protocols and algorithms that pertain to the evaluation of patients at risk for  COVID-19 are in a state of rapid change based on information released by regulatory bodies including the CDC and federal and state organizations. These policies and algorithms were followed during the patient's care in the ED.  Some ED evaluations and interventions may be delayed as a result of limited staffing during the pandemic.*  Note:  This document was prepared using Dragon voice recognition software and may include unintentional dictation errors.   Gregor Hams, MD 04/15/19 [phone]

## 2019-04-15 NOTE — ED Triage Notes (Signed)
Pt reports left sided abd pain that started around 1230; pt in wheelchair moaning; pale; unable to sit still; history of kidney stones and says this feels slightly similar; N/V-unable to keep anything down; pt reports trouble voiding, "I can't produce anything";

## 2019-04-15 NOTE — ED Notes (Signed)
Patient given water at this time.  

## 2019-04-17 ENCOUNTER — Ambulatory Visit (INDEPENDENT_AMBULATORY_CARE_PROVIDER_SITE_OTHER): Payer: 59 | Admitting: Urology

## 2019-04-17 ENCOUNTER — Other Ambulatory Visit: Payer: Self-pay | Admitting: Radiology

## 2019-04-17 ENCOUNTER — Ambulatory Visit: Payer: 59 | Admitting: Urology

## 2019-04-17 ENCOUNTER — Other Ambulatory Visit: Payer: Self-pay | Admitting: Urology

## 2019-04-17 ENCOUNTER — Other Ambulatory Visit
Admission: RE | Admit: 2019-04-17 | Discharge: 2019-04-17 | Disposition: A | Discharge status: Home / Self Care | Payer: Medicaid Other | Source: Ambulatory Visit | Attending: Urology | Admitting: Urology

## 2019-04-17 ENCOUNTER — Encounter: Payer: Self-pay | Admitting: Urology

## 2019-04-17 ENCOUNTER — Other Ambulatory Visit: Payer: Self-pay

## 2019-04-17 VITALS — BP 107/72 | HR 77 | Ht 67.0 in | Wt 142.0 lb

## 2019-04-17 DIAGNOSIS — Z01812 Encounter for preprocedural laboratory examination: Secondary | ICD-10-CM | POA: Insufficient documentation

## 2019-04-17 DIAGNOSIS — N201 Calculus of ureter: Secondary | ICD-10-CM | POA: Diagnosis not present

## 2019-04-17 DIAGNOSIS — Z20828 Contact with and (suspected) exposure to other viral communicable diseases: Secondary | ICD-10-CM | POA: Insufficient documentation

## 2019-04-17 LAB — URINALYSIS, COMPLETE
Bilirubin, UA: NEGATIVE
Glucose, UA: NEGATIVE
Ketones, UA: NEGATIVE
Leukocytes,UA: NEGATIVE
Nitrite, UA: NEGATIVE
Protein,UA: NEGATIVE
Specific Gravity, UA: 1.025 (ref 1.005–1.030)
Urobilinogen, Ur: 0.2 mg/dL (ref 0.2–1.0)
pH, UA: 5.5 (ref 5.0–7.5)

## 2019-04-17 LAB — MICROSCOPIC EXAMINATION

## 2019-04-17 NOTE — Progress Notes (Signed)
04/17/2019 2:13 PM   Peggye [facility] [birth year] [id number]  Referring provider: No referring provider defined for this encounter.  Chief Complaint  Patient presents with   Nephrolithiasis    HPI: 29 year old female with recurrent nephrolithiasis who returns today with a new acute stone episode.  She was seen and evaluated the emergency room on 04/15/2019 with acute onset left flank pain.  CT scan indicated a 6 mm left distal ureteral calculus with proximal hydroureteronephrosis.  Her pain was able to be controlled and she was appropriate for discharge.  No concern for infection.  Notably, she has a personal history of recurrent nephrolithiasis.  She recently underwent left ESWL on 01/2019 for an 8 mm left UVJ stone.  She passed this spontaneously and follow-up KUB showed complete resolution of the stone.  Review of imaging today, it appears that her 6 mm stone was previously nonobstructing in her kidney and this is truly a new stone episode.  She continues to have some dull intermittent left flank pain.  This is significantly better than it was on the weekend but she has not seen the stone pass and does not feel that she has passed the stone.  Urinalysis today with blood but otherwise no concern for infection.  No urinary symptoms including no dysuria gross hematuria or any other concern for infection.  No fevers or chills.  She is scheduled to do 24-hour urine collection in the near future for recurrent stone episodes.   PMH: Past Medical History:  Diagnosis Date   Kidney stone    Vulvar lesion     Surgical History: Past Surgical History:  Procedure Laterality Date   CYSTOSCOPY WITH STENT PLACEMENT Right 07/27/2018   Procedure: CYSTOSCOPY WITH STENT PLACEMENT;  Surgeon: Billey Co, MD;  Location: [facility] ORS;  Service: Urology;  Laterality: Right;   CYSTOSCOPY/URETEROSCOPY/HOLMIUM LASER/STENT PLACEMENT Right 08/03/2018   Procedure:  CYSTOSCOPY/URETEROSCOPY/HOLMIUM LASER/STENT EXCHANGE **WITH NST BEFORE AND AFTER;  Surgeon: [name], MD;  Location: [facility] ORS;  Service: Urology;  Laterality: Right;   EXTRACORPOREAL SHOCK WAVE LITHOTRIPSY Left 02/15/2019   Procedure: EXTRACORPOREAL SHOCK WAVE LITHOTRIPSY (ESWL);  Surgeon: [name], MD;  Location: [facility] ORS;  Service: Urology;  Laterality: Left;   none      Home Medications:  Allergies as of 04/17/2019   No Known Allergies     Medication List       Accurate as of April 17, 2019  2:13 PM. If you have any questions, ask your nurse or doctor.        STOP taking these medications   acetaminophen 500 MG tablet Commonly known as: TYLENOL Stopped by: [name], MD   ibuprofen 600 MG tablet Commonly known as: ADVIL Stopped by: [name], MD   Vitamin D3 125 MCG (5000 UT) Caps Stopped by: [name], MD     TAKE these medications   metoCLOPramide 10 MG tablet Commonly known as: REGLAN Take 1 tablet (10 mg total) by mouth 2 (two) times a day.   norethindrone 0.35 MG tablet Commonly known as: MICRONOR Take 1 tablet (0.35 mg total) by mouth daily.   oxyCODONE-acetaminophen 7.5-325 MG tablet Commonly known as: Percocet Take 1 tablet by mouth every 4 (four) hours as needed for severe pain.   tamsulosin 0.4 MG Caps capsule Commonly known as: FLOMAX Take 1 capsule (0.4 mg total) by mouth daily after breakfast.       Allergies: No Known Allergies  Family History: Family History  Problem Relation Age of  Onset   Congestive Heart Failure Maternal Grandfather    Parkinson's disease Paternal Grandmother    Kidney failure Cousin    Kidney cancer Neg Hx    Prostate cancer Neg Hx     Social History:  reports that she has never smoked. She has never used smokeless tobacco. She reports that she does not drink alcohol or use drugs.  ROS: UROLOGY Frequent Urination?: No Hard to postpone urination?: No Burning/pain with  urination?: No Get up at night to urinate?: No Leakage of urine?: No Urine stream starts and stops?: No Trouble starting stream?: No Do you have to strain to urinate?: No Blood in urine?: No Urinary tract infection?: No Sexually transmitted disease?: No Injury to kidneys or bladder?: No Painful intercourse?: No Weak stream?: No Currently pregnant?: No Vaginal bleeding?: No Last menstrual period?: n  Gastrointestinal Nausea?: No Vomiting?: No Indigestion/heartburn?: No Diarrhea?: No Constipation?: No  Constitutional Fever: No Night sweats?: No Weight loss?: No Fatigue?: No  Skin Skin rash/lesions?: No Itching?: No  Eyes Blurred vision?: No Double vision?: No  Ears/Nose/Throat Sore throat?: No Sinus problems?: No  Hematologic/Lymphatic Swollen glands?: No Easy bruising?: No  Cardiovascular Leg swelling?: No Chest pain?: No  Respiratory Cough?: No Shortness of breath?: No  Endocrine Excessive thirst?: No  Musculoskeletal Back pain?: Yes Joint pain?: No  Neurological Headaches?: No Dizziness?: No  Psychologic Depression?: No Anxiety?: No  Physical Exam: BP 107/72    Pulse 77    Ht 5\' 7"  (1.702 m)    Wt 142 lb (64.4 kg)    BMI 22.24 kg/m   Constitutional:  Alert and oriented, No acute distress. HEENT: [place] AT, moist mucus membranes.  Trachea midline, no masses. Cardiovascular: No clubbing, cyanosis, or edema. Respiratory: Normal respiratory effort, no increased work of breathing. Skin: No rashes, bruises or suspicious lesions. Neurologic: Grossly intact, no focal deficits, moving all 4 extremities. Psychiatric: Normal mood and affect.  Laboratory Data: Lab Results  Component Value Date   WBC 8.1 04/15/2019   HGB 12.1 04/15/2019   HCT 37.6 04/15/2019   MCV 82.1 04/15/2019   PLT 196 04/15/2019    Lab Results  Component Value Date   CREATININE 0.65 04/15/2019    Lab Results  Component Value Date   HGBA1C 5.0 07/29/2015     Urinalysis Results for orders placed or performed in visit on 04/17/19  Microscopic Examination   URINE  Result Value Ref Range   WBC, UA 6-10 (A) 0 - 5 /hpf   RBC 3-10 (A) 0 - 2 /hpf   Epithelial Cells (non renal) 0-10 0 - 10 /hpf   Mucus, UA Present (A) Not Estab.   Bacteria, UA Few None seen/Few  Urinalysis, Complete  Result Value Ref Range   Specific Gravity, UA 1.025 1.005 - 1.030   pH, UA 5.5 5.0 - 7.5   Color, UA Yellow Yellow   Appearance Ur Clear Clear   Leukocytes,UA Negative Negative   Protein,UA Negative Negative/Trace   Glucose, UA Negative Negative   Ketones, UA Negative Negative   RBC, UA 2+ (A) Negative   Bilirubin, UA Negative Negative   Urobilinogen, Ur 0.2 0.2 - 1.0 mg/dL   Nitrite, UA Negative Negative   Microscopic Examination See below:     Pertinent Imaging: Results for orders placed during the hospital encounter of 04/15/19  CT Renal Stone Study   Narrative CLINICAL DATA:  Abdominal pain.  EXAM: CT ABDOMEN AND PELVIS WITHOUT CONTRAST  TECHNIQUE: Multidetector CT imaging of the  abdomen and pelvis was performed following the standard protocol without IV contrast.  COMPARISON:  02/14/2019  FINDINGS: Lower chest: No acute abnormality.  Hepatobiliary: Imaged portions of the liver are unremarkable. Gallbladder appears normal. No biliary dilatation.  Pancreas: Unremarkable. No pancreatic ductal dilatation or surrounding inflammatory changes.  Spleen: Normal in size without focal abnormality.  Adrenals/Urinary Tract: Normal adrenal glands. Mild right-sided medullary calcinosis identified. Multiple right renal calculi are identified. The largest stone is in the inferior pole measuring 6 mm. There is no right-sided hydronephrosis, hydroureter or ureteral lithiasis. Asymmetric left-sided nephromegaly and mild perinephric edema. Persistent left-sided hydronephrosis is identified. Within the distal left ureter there is a stone measuring 6 mm.  Bladder unremarkable.  Stomach/Bowel: Stomach is within normal limits. Appendix not confidently identified. No evidence of bowel wall thickening, distention, or inflammatory changes.  Vascular/Lymphatic: No significant vascular findings are present. No enlarged abdominal or pelvic lymph nodes.  Reproductive: IUD in situ. Uterus and adnexal structures otherwise unremarkable.  Other: No abdominal wall hernia or abnormality. No abdominopelvic ascites.  Musculoskeletal: No acute or significant osseous findings.  IMPRESSION: 1. Left-sided hydronephrosis secondary to 6 mm distal left ureteral calculus. 2. Right-sided nephrolithiasis.   Electronically Signed   By: Kerby Moors M.D.   On: 04/15/2019 04:32    CT scan personally reviewed, agree with above radiologic interpretation.  Assessment & Plan:    1. Left ureteral stone New 6 mm left distal ureteral calculus with urinary obstruction  Intermittently symptomatic without sign of infection  We discussed management options again today including expectant management versus surgical intervention.  She is had successful ESWL for similar presentation as well as ureteroscopy in the past.  She is most interested in ESWL.  The risk of failure of the procedure, damage surrounding structures, bleeding, infection amongst others were all discussed.  She would like to proceed with this on Thursday.  All questions were answered.  She will continue to watch her stone in the interim.  She does mention that she is still breast-feeding.  She was able to breast-feed successfully without any issues following her last ESWL. - Urinalysis, Complete - CULTURE, URINE COMPREHENSIVE - Abdomen 1 view (KUB); Future   Return for MD follow up keep follow up in December with KUB prior .  [name], MD  [facility] [address] [place], [place] [address] [phone]

## 2019-04-18 ENCOUNTER — Telehealth: Payer: Self-pay | Admitting: Radiology

## 2019-04-18 LAB — SARS CORONAVIRUS 2 (TAT 6-24 HRS): SARS Coronavirus 2: NEGATIVE

## 2019-04-18 NOTE — Telephone Encounter (Signed)
Patient just called stating she passed the stone. She has it & says it looks like ones she has passed previously. Per Dr Erlene Quan, ESWL scheduled 04/19/2019 will be cancelled & patient should keep her follow up appointment as previously scheduled. Pt aware.

## 2019-04-19 ENCOUNTER — Ambulatory Visit: Admission: RE | Admit: 2019-04-19 | Payer: Medicaid Other | Source: Home / Self Care | Admitting: Urology

## 2019-04-19 ENCOUNTER — Encounter: Admission: RE | Payer: Self-pay | Source: Home / Self Care

## 2019-04-19 SURGERY — LITHOTRIPSY, ESWL
Anesthesia: Moderate Sedation | Laterality: Left

## 2019-04-20 LAB — CULTURE, URINE COMPREHENSIVE

## 2019-05-01 ENCOUNTER — Ambulatory Visit: Payer: 59 | Admitting: Physician Assistant

## 2019-05-10 ENCOUNTER — Encounter: Payer: 59 | Admitting: Obstetrics and Gynecology

## 2019-05-16 ENCOUNTER — Encounter: Payer: 59 | Admitting: Certified Nurse Midwife

## 2019-05-29 ENCOUNTER — Ambulatory Visit: Payer: Medicaid Other | Attending: Internal Medicine

## 2019-05-29 DIAGNOSIS — Z20822 Contact with and (suspected) exposure to covid-19: Secondary | ICD-10-CM

## 2019-05-31 LAB — NOVEL CORONAVIRUS, NAA: SARS-CoV-2, NAA: NOT DETECTED

## 2019-06-29 ENCOUNTER — Ambulatory Visit (INDEPENDENT_AMBULATORY_CARE_PROVIDER_SITE_OTHER): Payer: Medicaid Other | Admitting: Certified Nurse Midwife

## 2019-06-29 ENCOUNTER — Encounter: Payer: Self-pay | Admitting: Certified Nurse Midwife

## 2019-06-29 ENCOUNTER — Other Ambulatory Visit: Payer: Self-pay

## 2019-06-29 ENCOUNTER — Other Ambulatory Visit (HOSPITAL_COMMUNITY)
Admission: RE | Admit: 2019-06-29 | Discharge: 2019-06-29 | Disposition: A | Discharge status: Home / Self Care | Payer: Medicaid Other | Source: Ambulatory Visit | Attending: Certified Nurse Midwife | Admitting: Certified Nurse Midwife

## 2019-06-29 VITALS — BP 107/65 | HR 78 | Ht 67.0 in | Wt 150.6 lb

## 2019-06-29 DIAGNOSIS — Z01419 Encounter for gynecological examination (general) (routine) without abnormal findings: Secondary | ICD-10-CM | POA: Insufficient documentation

## 2019-06-29 DIAGNOSIS — Z124 Encounter for screening for malignant neoplasm of cervix: Secondary | ICD-10-CM | POA: Insufficient documentation

## 2019-06-29 DIAGNOSIS — Z23 Encounter for immunization: Secondary | ICD-10-CM

## 2019-06-29 DIAGNOSIS — Z136 Encounter for screening for cardiovascular disorders: Secondary | ICD-10-CM

## 2019-06-29 NOTE — Progress Notes (Signed)
GYNECOLOGY ANNUAL PREVENTATIVE CARE ENCOUNTER NOTE  History:     Evelyn Caldwell is a 29 y.o. [phone] female here for a routine annual gynecologic exam.  Current complaints: none.   Denies abnormal vaginal bleeding, discharge, pelvic pain, problems with intercourse or other gynecologic concerns.     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 (Menstrual status: IUD). Contraception: IUD Last Pap: 3/21/208. Results were: normal  Last mammogram: n/a  Obstetric History OB History  Gravida Para Term Preterm AB Living  2 2 2     2   SAB TAB Ectopic Multiple Live Births        0 2    # Outcome Date GA Lbr Len/2nd Weight Sex Delivery Anes PTL Lv  2 Term 10/18/18 [redacted]w[redacted]d 06:30 / 01:49 8 lb 12.4 oz (3.98 kg) F Vag-Spont EPI  LIV  1 Term 03/09/16 [redacted]w[redacted]d  8 lb 1.6 oz (3.674 kg) F Vag-Spont   LIV    Past Medical History:  Diagnosis Date  . Kidney stone   . Vulvar lesion       Current Outpatient Medications on File Prior to Visit  Medication Sig Dispense Refill  . metoCLOPramide (REGLAN) 10 MG tablet Take 1 tablet (10 mg total) by mouth 2 (two) times a day. (Patient not taking: Reported on 06/29/2019) 60 tablet 2  . norethindrone (MICRONOR) 0.35 MG tablet Take 1 tablet (0.35 mg total) by mouth daily. (Patient not taking: Reported on 06/29/2019) 1 Package 11  . oxyCODONE-acetaminophen (PERCOCET) 7.5-325 MG tablet Take 1 tablet by mouth every 4 (four) hours as needed for severe pain. (Patient not taking: Reported on 06/29/2019) 20 tablet 0  . tamsulosin (FLOMAX) 0.4 MG CAPS capsule Take 1 capsule (0.4 mg total) by mouth daily after breakfast. (Patient not taking: Reported on 06/29/2019) 30 capsule 0   No current facility-administered medications on file prior to visit.    No Known Allergies  Social History:  reports that she  has never smoked. She has never used smokeless tobacco. She reports that she does not drink alcohol or use drugs.  Family History  Problem Relation Age of Onset  . Congestive Heart Failure Maternal Grandfather   . Parkinson's disease Paternal Grandmother   . Kidney failure Cousin   . Kidney cancer Neg Hx   . Prostate cancer Neg Hx     The following portions of the patient's history were reviewed and updated as appropriate: allergies, current medications, past family history, past medical history, past social history, past surgical history and problem list.  Review of Systems Pertinent items noted in HPI and remainder of comprehensive ROS otherwise negative.  Physical Exam:  BP 107/65   Pulse 78   Ht 5\' 7"  (1.702 m)   Wt 150 lb 9 oz (68.3 kg)   BMI 23.58 kg/m  CONSTITUTIONAL: Well-developed, well-nourished female in no acute distress.  HENT:  Normocephalic, atraumatic, External right and left ear normal. Oropharynx is clear and moist EYES: Conjunctivae and EOM are normal. Pupils are equal, round, and reactive to light. No scleral icterus.  NECK: Normal range of motion, supple, no masses.  Normal thyroid.  SKIN: Skin is warm and dry. No rash noted. Not diaphoretic. No erythema. No pallor. MUSCULOSKELETAL: Normal range of motion. No tenderness.  No cyanosis, clubbing, or edema.  2+ distal pulses. NEUROLOGIC: Alert and oriented to person, place, and time. Normal reflexes, muscle  tone coordination.  PSYCHIATRIC: Normal mood and affect. Normal behavior. Normal judgment and thought content. CARDIOVASCULAR: Normal heart rate noted, regular rhythm RESPIRATORY: Clear to auscultation bilaterally. Effort and breath sounds normal, no problems with respiration noted. BREASTS: Symmetric in size. No masses, tenderness, skin changes, nipple drainage, or lymphadenopathy bilaterally. ABDOMEN: Soft, no distention noted.  No tenderness, rebound or guarding.  PELVIC: Normal appearing external genitalia  and urethral meatus; normal appearing vaginal mucosa and cervix.  No abnormal discharge noted.  Pap smear obtained. Strings present, contact bleeding with pap.   Normal uterine size, no other palpable masses, no uterine or adnexal tenderness.   Assessment and Plan:    1. Screening for cervical cancer - Cytology - PAP  2. Screening for cardiovascular condition - Lipid panel  3. Women's annual routine gynecological examination - Lipid panel - Cytology - PAP  Will follow up results of pap smear and manage accordingly. Mammogram not indicated Labs: lipid profile Refills:none Encouraged exercise.  Routine preventative health maintenance measures emphasized. Please refer to After Visit Summary for other counseling recommendations.      Philip Aspen, CNM

## 2019-06-29 NOTE — Patient Instructions (Signed)
Preventive Care 21-30 Years Old, Female Preventive care refers to visits with your health care provider and lifestyle choices that can promote health and wellness. This includes:  A yearly physical exam. This may also be called an annual well check.  Regular dental visits and eye exams.  Immunizations.  Screening for certain conditions.  Healthy lifestyle choices, such as eating a healthy diet, getting regular exercise, not using drugs or products that contain nicotine and tobacco, and limiting alcohol use. What can I expect for my preventive care visit? Physical exam Your health care provider will check your:  Height and weight. This may be used to calculate body mass index (BMI), which tells if you are at a healthy weight.  Heart rate and blood pressure.  Skin for abnormal spots. Counseling Your health care provider may ask you questions about your:  Alcohol, tobacco, and drug use.  Emotional well-being.  Home and relationship well-being.  Sexual activity.  Eating habits.  Work and work environment.  Method of birth control.  Menstrual cycle.  Pregnancy history. What immunizations do I need?  Influenza (flu) vaccine  This is recommended every year. Tetanus, diphtheria, and pertussis (Tdap) vaccine  You may need a Td booster every 10 years. Varicella (chickenpox) vaccine  You may need this if you have not been vaccinated. Human papillomavirus (HPV) vaccine  If recommended by your health care provider, you may need three doses over 6 months. Measles, mumps, and rubella (MMR) vaccine  You may need at least one dose of MMR. You may also need a second dose. Meningococcal conjugate (MenACWY) vaccine  One dose is recommended if you are age 19-21 years and a first-year college student living in a residence hall, or if you have one of several medical conditions. You may also need additional booster doses. Pneumococcal conjugate (PCV13) vaccine  You may need  this if you have certain conditions and were not previously vaccinated. Pneumococcal polysaccharide (PPSV23) vaccine  You may need one or two doses if you smoke cigarettes or if you have certain conditions. Hepatitis A vaccine  You may need this if you have certain conditions or if you travel or work in places where you may be exposed to hepatitis A. Hepatitis B vaccine  You may need this if you have certain conditions or if you travel or work in places where you may be exposed to hepatitis B. Haemophilus influenzae type b (Hib) vaccine  You may need this if you have certain conditions. You may receive vaccines as individual doses or as more than one vaccine together in one shot (combination vaccines). Talk with your health care provider about the risks and benefits of combination vaccines. What tests do I need?  Blood tests  Lipid and cholesterol levels. These may be checked every 5 years starting at age 20.  Hepatitis C test.  Hepatitis B test. Screening  Diabetes screening. This is done by checking your blood sugar (glucose) after you have not eaten for a while (fasting).  Sexually transmitted disease (STD) testing.  BRCA-related cancer screening. This may be done if you have a family history of breast, ovarian, tubal, or peritoneal cancers.  Pelvic exam and Pap test. This may be done every 3 years starting at age 21. Starting at age 30, this may be done every 5 years if you have a Pap test in combination with an HPV test. Talk with your health care provider about your test results, treatment options, and if necessary, the need for more tests.   Follow these instructions at home: Eating and drinking   Eat a diet that includes fresh fruits and vegetables, whole grains, lean protein, and low-fat dairy.  Take vitamin and mineral supplements as recommended by your health care provider.  Do not drink alcohol if: ? Your health care provider tells you not to drink. ? You are  pregnant, may be pregnant, or are planning to become pregnant.  If you drink alcohol: ? Limit how much you have to 0-1 drink a day. ? Be aware of how much alcohol is in your drink. In the [place] one drink equals one 12 oz bottle of beer (355 mL), one 5 oz glass of wine (148 mL), or one 1 oz glass of hard liquor (44 mL). Lifestyle  Take daily care of your teeth and gums.  Stay active. Exercise for at least 30 minutes on 5 or more days each week.  Do not use any products that contain nicotine or tobacco, such as cigarettes, e-cigarettes, and chewing tobacco. If you need help quitting, ask your health care provider.  If you are sexually active, practice safe sex. Use a condom or other form of birth control (contraception) in order to prevent pregnancy and STIs (sexually transmitted infections). If you plan to become pregnant, see your health care provider for a preconception visit. What's next?  Visit your health care provider once a year for a well check visit.  Ask your health care provider how often you should have your eyes and teeth checked.  Stay up to date on all vaccines. This information is not intended to replace advice given to you by your health care provider. Make sure you discuss any questions you have with your health care provider. Document Revised: 01/26/2018 Document Reviewed: 01/26/2018 Elsevier Patient Education  2020 [employer].

## 2019-06-30 LAB — LIPID PANEL
Chol/HDL Ratio: 3 ratio (ref 0.0–4.4)
Cholesterol, Total: 142 mg/dL (ref 100–199)
HDL: 48 mg/dL (ref >39)
LDL Chol Calc (NIH): 70 mg/dL (ref 0–99)
Triglycerides: 140 mg/dL (ref 0–149)
VLDL Cholesterol Cal: 24 mg/dL (ref 5–40)

## 2019-07-01 ENCOUNTER — Encounter: Payer: Self-pay | Admitting: Urology

## 2019-07-02 NOTE — Telephone Encounter (Signed)
Called patient to discuss symptoms, having dull aches/pains ongoing for one month. Denies fever, body aches or any urinary symptoms. She did not follow up in December as scheduled, rescheduled appointment and is aware to have KUB done prior. She has not completed Lith[facility]. Stated she can discuss at her appointment. Voiced understanding.

## 2019-07-03 ENCOUNTER — Ambulatory Visit
Admission: RE | Admit: 2019-07-03 | Discharge: 2019-07-03 | Disposition: A | Discharge status: Home / Self Care | Payer: Medicaid Other | Source: Ambulatory Visit | Attending: Urology | Admitting: Urology

## 2019-07-03 ENCOUNTER — Other Ambulatory Visit: Payer: Self-pay

## 2019-07-03 ENCOUNTER — Ambulatory Visit
Admission: RE | Admit: 2019-07-03 | Discharge: 2019-07-03 | Disposition: A | Discharge status: Home / Self Care | Payer: Medicaid Other | Source: Ambulatory Visit | Attending: Physician Assistant | Admitting: Physician Assistant

## 2019-07-03 ENCOUNTER — Encounter: Payer: Self-pay | Admitting: Physician Assistant

## 2019-07-03 ENCOUNTER — Ambulatory Visit (INDEPENDENT_AMBULATORY_CARE_PROVIDER_SITE_OTHER): Payer: Medicaid Other | Admitting: Physician Assistant

## 2019-07-03 VITALS — BP 119/70 | HR 103 | Ht 68.0 in | Wt 150.0 lb

## 2019-07-03 DIAGNOSIS — N201 Calculus of ureter: Secondary | ICD-10-CM | POA: Insufficient documentation

## 2019-07-03 DIAGNOSIS — R109 Unspecified abdominal pain: Secondary | ICD-10-CM | POA: Diagnosis not present

## 2019-07-03 DIAGNOSIS — Z87442 Personal history of urinary calculi: Secondary | ICD-10-CM | POA: Diagnosis present

## 2019-07-03 MED ORDER — TAMSULOSIN HCL 0.4 MG PO CAPS: 0.4000 mg | ORAL_CAPSULE | Freq: Every day | ORAL | 0 refills | Status: DC

## 2019-07-03 NOTE — Patient Instructions (Signed)
Call with results 

## 2019-07-03 NOTE — Progress Notes (Signed)
07/03/2019 11:36 AM   Peggye Pitt [facility] 1990/06/02 [id number]  CC: Right flank pain  HPI: Evelyn Caldwell is a 29 y.o. female who presents to the clinic for evaluation of possible acute stone episode. She is an established [facility] patient with a recent history of recurrent nephrolithiasis, having undergone ESWL for an 8 mm left UVJ stone in September 2020 and subsequently passing a 6 mm left sided stone in November 2020.  CT stone study on 04/15/2019 revealed mild right medullary calcinosis with multiple nonobstructing right renal stones, the largest of which measuring 6 mm.  Today, she reports a 3-day history of intermittent, dull, aching, 2-3/10 left flank pain.  She denies fever, chills, nausea, vomiting, and gross hematuria.  She has not taken any medication at home for treatment of her symptoms.  KUB today reveals a stable 6 mm right renal stone.  No other stones visualized.  In-office UA today positive for trace-intact blood; urine microscopy pan-negative.  PMH: Past Medical History:  Diagnosis Date  . Kidney stone   . Vulvar lesion     Surgical History: Past Surgical History:  Procedure Laterality Date  . CYSTOSCOPY WITH STENT PLACEMENT Right 07/27/2018   Procedure: CYSTOSCOPY WITH STENT PLACEMENT;  Surgeon: Billey Co, MD;  Location: [facility];  Service: Urology;  Laterality: Right;  . CYSTOSCOPY/URETEROSCOPY/HOLMIUM LASER/STENT PLACEMENT Right 08/03/2018   Procedure: CYSTOSCOPY/URETEROSCOPY/HOLMIUM LASER/STENT EXCHANGE **WITH NST BEFORE AND AFTER;  Surgeon: [name], MD;  Location: [facility];  Service: Urology;  Laterality: Right;  . EXTRACORPOREAL SHOCK WAVE LITHOTRIPSY Left 02/15/2019   Procedure: EXTRACORPOREAL SHOCK WAVE LITHOTRIPSY (ESWL);  Surgeon: [name], MD;  Location: [facility] OR[facility];  Service: Urology;  Laterality: Left;  . none      Home Medications:  Allergies as of 07/03/2019   No Known Allergies     Medication List        Accurate as of July 03, 2019 11:36 AM. If you have any questions, ask your nurse or doctor.        metoCLOPramide 10 MG tablet Commonly known as: REGLAN Take 1 tablet (10 mg total) by mouth 2 (two) times a day.   norethindrone 0.35 MG tablet Commonly known as: MICRONOR Take 1 tablet (0.35 mg total) by mouth daily.   oxyCODONE-acetaminophen 7.5-325 MG tablet Commonly known as: Percocet Take 1 tablet by mouth every 4 (four) hours as needed for severe pain.   tamsulosin 0.4 MG Caps capsule Commonly known as: FLOMAX Take 1 capsule (0.4 mg total) by mouth daily after breakfast.       Allergies: No Known Allergies  Family History: Family History  Problem Relation Age of Onset  . Congestive Heart Failure Maternal Grandfather   . Parkinson's disease Paternal Grandmother   . Kidney failure Cousin   . Kidney cancer Neg Hx   . Prostate cancer Neg Hx     Social History:  reports that she has never smoked. She has never used smokeless tobacco. She reports that she does not drink alcohol or use drugs.  ROS: UROLOGY Frequent Urination?: No Hard to postpone urination?: No Burning/pain with urination?: No Get up at night to urinate?: No Leakage of urine?: No Urine stream starts and stops?: No Trouble starting stream?: No Do you have to strain to urinate?: No Blood in urine?: No Urinary tract infection?: No Sexually transmitted disease?: No Injury to kidneys or bladder?: No Painful intercourse?: No Weak stream?: No Currently pregnant?: No Vaginal bleeding?: No Last menstrual period?: n  Gastrointestinal  Nausea?: No Vomiting?: No Indigestion/heartburn?: No Diarrhea?: No Constipation?: No  Constitutional Fever: No Night sweats?: No Weight loss?: No Fatigue?: No  Skin Skin rash/lesions?: No Itching?: No  Eyes Blurred vision?: No Double vision?: No  Ears/Nose/Throat Sore throat?: No Sinus problems?: No  Hematologic/Lymphatic Swollen glands?: No Easy  bruising?: No  Cardiovascular Leg swelling?: No Chest pain?: No  Respiratory Cough?: No Shortness of breath?: No  Endocrine Excessive thirst?: No  Musculoskeletal Back pain?: No Joint pain?: No  Neurological Headaches?: No Dizziness?: No  Psychologic Depression?: No Anxiety?: No  Physical Exam: BP 119/70   Pulse (!) 103   Ht 5\' 8"  (1.727 m)   Wt 150 lb (68 kg)   BMI 22.81 kg/m   Constitutional:  Alert and oriented, No acute distress. HEENT: [place] AT, moist mucus membranes.  Trachea midline, no masses. Cardiovascular: No clubbing, cyanosis, or edema. Respiratory: Normal respiratory effort, no increased work of breathing. Skin: No rashes, bruises or suspicious lesions. Neurologic: Grossly intact, no focal deficits, moving all 4 extremities. Psychiatric: Normal mood and affect.  Laboratory Data: Results for orders placed or performed in visit on 07/03/19  Microscopic Examination   URINE  Result Value Ref Range   WBC, UA 0-5 0 - 5 /hpf   RBC 0-2 0 - 2 /hpf   Epithelial Cells (non renal) 0-10 0 - 10 /hpf   Casts Present (A) None seen /lpf   Cast Type Hyaline casts N/A   Mucus, UA Present (A) Not Estab.   Bacteria, UA Few None seen/Few  Urinalysis, Complete  Result Value Ref Range   Specific Gravity, UA >1.030 (H) 1.005 - 1.030   pH, UA 5.5 5.0 - 7.5   Color, UA Yellow Yellow   Appearance Ur Hazy (A) Clear   Leukocytes,UA Negative Negative   Protein,UA Negative Negative/Trace   Glucose, UA Negative Negative   Ketones, UA Negative Negative   RBC, UA Trace (A) Negative   Bilirubin, UA Negative Negative   Urobilinogen, Ur 0.2 0.2 - 1.0 mg/dL   Nitrite, UA Negative Negative   Microscopic Examination See below:    Pertinent Imaging: KUB, 07/03/2019: CLINICAL DATA:  Right flank pain.  History of kidney stones.  EXAM: ABDOMEN - 1 VIEW  COMPARISON:  CT 04/15/2019.  FINDINGS: Stable right nephrolithiasis. No evidence of ureteral stone. IUD noted in the  pelvis. Large amount of stool noted throughout the colon. No acute bony abnormality identified.  IMPRESSION: 1.  Stable right nephrolithiasis.  No evidence of ureteral stone.  2.  IUD noted the pelvis.   Electronically Signed   By: Marcello Moores  Register   On: 07/03/2019 15:27  Renal ultrasound, 07/03/2019: CLINICAL DATA:  RIGHT flank pain since Saturday, history of urolithiasis  EXAM: RENAL / URINARY TRACT ULTRASOUND COMPLETE  COMPARISON:  CT abdomen and pelvis 04/15/2019, 02/14/2019  FINDINGS: Right Kidney:  Renal measurements: 11.0 x 4.6 x 6.0 cm = volume: 155 mL. Normal cortical thickness. Upper normal cortical echogenicity. No mass or hydronephrosis. Questionable 8 mm nonobstructing calculus at upper pole versus medullary calcinosis seen on prior CT. Small extrarenal pelvis RIGHT kidney.  Left Kidney:  Renal measurements: 11.3 x 4.9 x 4.8 cm = volume: 142 mL. Normal cortical thickness. Upper normal cortical echogenicity. No mass, hydronephrosis or shadowing calcification.  Bladder:  Appears normal for degree of bladder distention.  Other:  7 x 6 x 8 mm hypoechoic lesion within the spleen, uncertain etiology, not seen on prior CT.  IMPRESSION: Questionable 8 mm nonobstructing calculus versus  medullary calcinosis at upper pole of RIGHT kidney.  No additional renal sonographic abnormalities.  Nonspecific 8 mm low-attenuation focus within spleen; this does not correspond to the cystic lesion seen within the spleen on CT of [id number] and is of uncertain etiology.   Electronically Signed   By: Lavonia Dana M.D.   On: 07/03/2019 13:25  I personally reviewed the images referenced above and note a stable 6 mm right renal stone with the absence of bilateral hydronephrosis.  Assessment & Plan:   1. Flank pain with history of urolithiasis 29 year old female with a recent history of recurrent nephrolithiasis presents today with a 3-day history of  intermittent, mild flank pain and concerns for acute stone episode.  Recent CT stone study reveals right-sided stones that are not apparent on KUB.  Obtained renal ultrasound today for further evaluation; no hydronephrosis noted.  UA reassuring for infection with no microscopic hematuria.  Cannot rule out passage of a small, nonobstructing right renal stone at this time.  Given her frequency of stone episodes, will start her on Flomax x1 month and counsel her to strain her urine and push fluids in the interim.  Patient to return to clinic in 1 month for symptom recheck.  Contacted the patient via telephone to report the results of her renal ultrasound. Counseled her to contact the office immediately or proceed to the emergency room if she develops uncontrollable flank pain, nausea, vomiting, fever, or chills in the interim.  She expressed understanding. - Urinalysis, Complete - US RENAL   Return in 4 weeks (on 07/31/2019) for symptom recheck.   I spent 25 min with this patient, of which greater than 50% was spent in counseling and coordination of care with the patient.    [name], PA-C  [facility] [address] [place], [place] [address] [phone]

## 2019-07-05 LAB — URINALYSIS, COMPLETE
Bilirubin, UA: NEGATIVE
Glucose, UA: NEGATIVE
Ketones, UA: NEGATIVE
Leukocytes,UA: NEGATIVE
Nitrite, UA: NEGATIVE
Protein,UA: NEGATIVE
Specific Gravity, UA: >1.03 — ABNORMAL HIGH (ref 1.005–1.030)
Urobilinogen, Ur: 0.2 mg/dL (ref 0.2–1.0)
pH, UA: 5.5 (ref 5.0–7.5)

## 2019-07-05 LAB — MICROSCOPIC EXAMINATION

## 2019-08-02 ENCOUNTER — Other Ambulatory Visit: Payer: Self-pay

## 2019-08-02 ENCOUNTER — Ambulatory Visit (INDEPENDENT_AMBULATORY_CARE_PROVIDER_SITE_OTHER): Payer: Medicaid Other | Admitting: Physician Assistant

## 2019-08-02 VITALS — BP 105/71 | HR 68 | Ht 67.0 in | Wt 149.0 lb

## 2019-08-02 DIAGNOSIS — R109 Unspecified abdominal pain: Secondary | ICD-10-CM | POA: Diagnosis not present

## 2019-08-02 DIAGNOSIS — Z87442 Personal history of urinary calculi: Secondary | ICD-10-CM

## 2019-08-02 DIAGNOSIS — N200 Calculus of kidney: Secondary | ICD-10-CM

## 2019-08-02 NOTE — Progress Notes (Signed)
08/02/2019 12:01 PM   Evelyn Caldwell [birth year] [id number]  CC: Symptom recheck  HPI: Evelyn Caldwell is a 29 y.o. female who presents today for symptom recheck.  I saw her in clinic on 07/03/2019, at which point she noted a 3-day history of intermittent, dull, aching left flank pain with concern for possible acute stone episode.  She had a recent urologic history of recurrent nephrolithiasis with ESWL for an 8 mm left UVJ stone in September 2020 and subsequently passing a 6 mm left-sided renal stone in November 2020.  KUB at that time was reassuring for acute stone episode, follow-up renal ultrasound without hydronephrosis.  I started her on a trial of Flomax at that visit with concern that she might be passing a punctate stone and asked her to follow-up in our clinic in 1 month.  Today, she reports her flank pain stopped after 2 to 3 days.  She took Flomax for approximately 1 week, however she stopped this due to side effects including dizziness, orthostatic hypotension, and palpitations.  She has no acute concerns today.  She has previously been set up to complete a metabolic work-up with [facility]ol[facility] and would like to proceed with this at this time.  PMH: Past Medical History:  Diagnosis Date  . Kidney stone   . Vulvar lesion     Surgical History: Past Surgical History:  Procedure Laterality Date  . CYSTOSCOPY WITH STENT PLACEMENT Right 07/27/2018   Procedure: CYSTOSCOPY WITH STENT PLACEMENT;  Surgeon: Billey Co, MD;  Location: [facility];  Service: Urology;  Laterality: Right;  . CYSTOSCOPY/URETEROSCOPY/HOLMIUM LASER/STENT PLACEMENT Right 08/03/2018   Procedure: CYSTOSCOPY/URETEROSCOPY/HOLMIUM LASER/STENT EXCHANGE **WITH NST BEFORE AND AFTER;  Surgeon: [name], MD;  Location: [facility];  Service: Urology;  Laterality: Right;  . EXTRACORPOREAL SHOCK WAVE LITHOTRIPSY Left 02/15/2019   Procedure: EXTRACORPOREAL SHOCK WAVE LITHOTRIPSY (ESWL);  Surgeon: [name], MD;  Location: [facility];  Service: Urology;  Laterality: Left;  . none      Home Medications:  Allergies as of 08/02/2019   No Known Allergies     Medication List       Accurate as of August 02, 2019 12:01 PM. If you have any questions, ask your nurse or doctor.        STOP taking these medications   metoCLOPramide 10 MG tablet Commonly known as: REGLAN Stopped by: [name], PA-C   norethindrone 0.35 MG tablet Commonly known as: MICRONOR Stopped by: [name], PA-C   oxyCODONE-acetaminophen 7.5-325 MG tablet Commonly known as: Percocet Stopped by: [name], PA-C   tamsulosin 0.4 MG Caps capsule Commonly known as: FLOMAX Stopped by: [name], PA-C       Allergies:  No Known Allergies  Family History: Family History  Problem Relation Age of Onset  . Congestive Heart Failure Maternal Grandfather   . Parkinson's disease Paternal Grandmother   . Kidney failure Cousin   . Kidney cancer Neg Hx   . Prostate cancer Neg Hx     Social History:   reports that she has never smoked. She has never used smokeless tobacco. She reports that she does not drink alcohol or use drugs.   Physical Exam: BP 105/71   Pulse 68   Ht 5\' 7"  (1.702 m)   Wt 149 lb (67.6 kg)   LMP 07/29/2019 (Approximate)   BMI 23.34 kg/m   Constitutional:  Alert and oriented, no acute distress, nontoxic appearing HEENT: [place], AT Cardiovascular: No clubbing, cyanosis, or edema  Respiratory: Normal respiratory effort, no increased work of breathing Skin: No rashes, bruises or suspicious lesions Neurologic: Grossly intact, no focal deficits, moving all 4 extremities Psychiatric: Normal mood and affect  Assessment & Plan:   1. Flank pain with history of urolithiasis Resolved.  No acute concerns today.  No further intervention indicated at this time.  2. Recurrent nephrolithiasis Provided patient with information sheet on completing Lithol[facility]  including instructions on scheduling a lab appointment for blood draw on the day of urine drop off.  She expressed understanding.  We will schedule patient for routine follow-up in 1 year with KUB prior. - DG Abd 1 View; Future   Return in about 1 year (around 08/01/2020) for Stone f/u with KUB prior.  [name], PA-C  [facility] [address] [place],  [address] [phone]

## 2019-09-12 ENCOUNTER — Ambulatory Visit: Payer: Medicaid Other | Admitting: Dermatology

## 2019-09-12 ENCOUNTER — Encounter: Payer: Self-pay | Admitting: Dermatology

## 2019-09-12 ENCOUNTER — Other Ambulatory Visit: Payer: Self-pay

## 2019-09-12 VITALS — Wt 149.0 lb

## 2019-09-12 DIAGNOSIS — L709 Acne, unspecified: Secondary | ICD-10-CM

## 2019-09-12 NOTE — Progress Notes (Signed)
   New Patient Visit  Subjective  Evelyn Caldwell is a 29 y.o. female who presents for the following: Acne (b/l jawline, and back, sometimes on chest only when menstrual  cycle starts ).  Has been having issues with Acne for years Having a mild break out at this time but breaks out worse with periods. Involves face, chest and back.  Objective  Well appearing patient in no apparent distress; mood and affect are within normal limits.  A focused examination was performed including face, chest, shoulders and back. Relevant physical exam findings are noted in the Assessment and Plan.  Objective  Chest - Medial [facility]), Left Anterior Mandible, Mid Back: Scattered inflammatory papules and scars at back Few inflammatory papules at chest Trace open comedones, scattered inflammatory papules favoring jaw very rare small scar  Assessment & Plan  Acne, unspecified acne type (3) Chest - Medial (Center); Mid Back; Left Anterior Mandible  Discussed Accutane, given as a 6 to 8 month course on average, requires blood work. Patient would like to start.  While taking Isotretinoin and for 30 days after you finish the medication, do not get pregnant, do not share pills, do not donate blood. Isotretinoin is best absorbed when taken with a fatty meal. Isotretinoin can make you sensitive to the sun. Daily careful sun protection including sunscreen SPF 30+ when outdoors is recommended.   No hx of depression, or family hx of IBD.  Recommend taking Heliocare sun protection supplement daily in sunny weather for additional sun protection. For maximum protection on the sunniest days, you can take up to 2 capsules of regular Heliocare OR take 1 capsule of Heliocare Ultra. For prolonged exposure (such as a full day in the sun), you can repeat your dose of the supplement 4 hours after your first dose. Heliocare can be purchased at [facility] or at [email or web address].  Discussed Spironolactone can  cause increased urination and cause blood pressure to decrease. Please watch for signs of lightheadedness and be cautious when changing position. It can sometimes cause breast tenderness or an irregular period in premenopausal women. It can also increase potassium. The increase in potassium usually is not a concern unless you are taking other medicines that also increase potassium, so please be sure your doctor knows all of the other medications you are taking. This medication should not be taken  by pregnant women.  Will order Accutane labs to be done after upcoming visit and will register in iPledge program.   Other Related Procedures Lipid panel hCG, serum, qualitative Comprehensive metabolic panel Urine pregnancy test performed in office today and was negative.  Patient demonstrates comprehension and confirms she will not get pregnant.  Isotretinoin F/U - 09/12/19 1400      Isotretinoin Follow Up   iPledge #  [id number]    Date  09/12/19    Weight  149 lb (67.6 kg)    Two Forms of Birth Control  IUD;Female Condom      Patient has been registered in [place]  Return in 1 month (on 10/12/2019) for Acne.   IDonzetta Kohut, CMA, am acting as scribe for Forest Gleason, MD .  Documentation: I have reviewed the above documentation for accuracy and completeness, and I agree with the above.  Forest Gleason, MD

## 2019-09-12 NOTE — Telephone Encounter (Signed)
-----   Message from Alfonso Patten, MD sent at 09/12/2019  5:41 PM EDT ----- Please call patient and let her know not to go get labs done until just after her follow-up visit since we have to wait the 30 day period for ipledge. Thank you!

## 2019-09-12 NOTE — Patient Instructions (Signed)
While taking Isotretinoin and for 30 days after you finish the medication, do not get pregnant, do not share pills, do not donate blood. Isotretinoin is best absorbed when taken with a fatty meal. Isotretinoin can make you sensitive to the sun. Daily careful sun protection including sunscreen SPF 30+ when outdoors is recommended.  

## 2019-09-27 ENCOUNTER — Encounter: Payer: Self-pay | Admitting: Dermatology

## 2019-09-28 IMAGING — US US RENAL
1 series · 14 of 25 positions shown · non-contrast
Comparison: Ultrasound January 06, 2016.

CLINICAL DATA: Right flank pain.

EXAM:
RENAL / URINARY TRACT ULTRASOUND COMPLETE

[Series 1: us renal · 0.19mm/px · 14 of 54 slices shown]
[im 1/54]
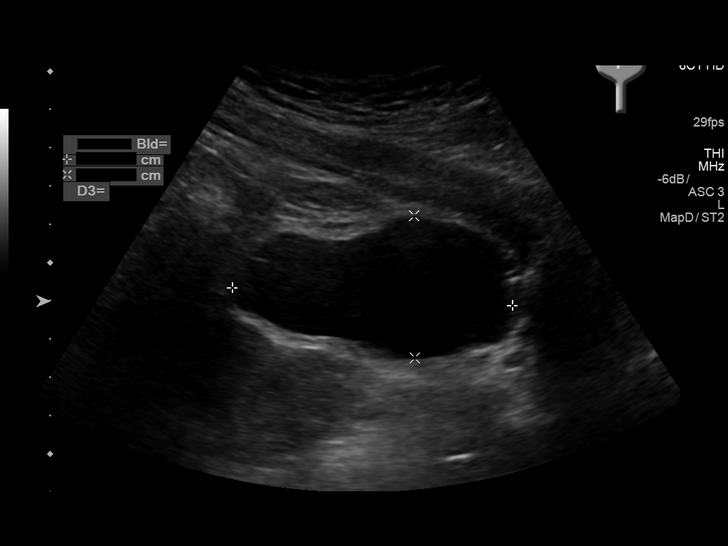
[im 5/54]
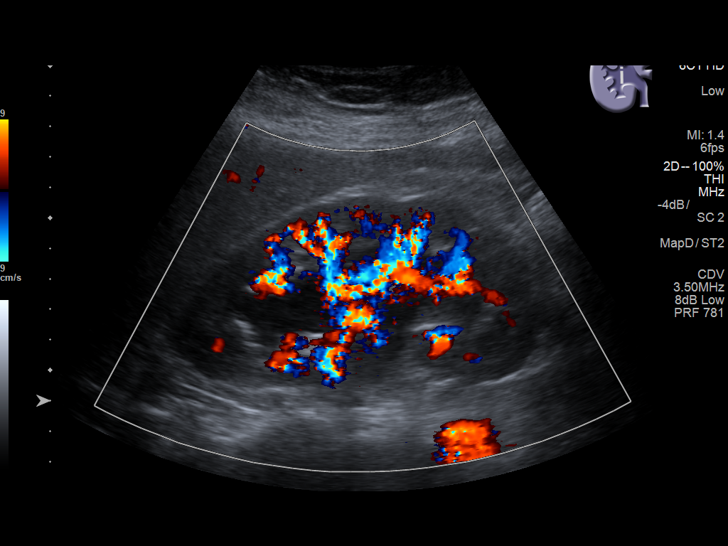
[im 9/54]
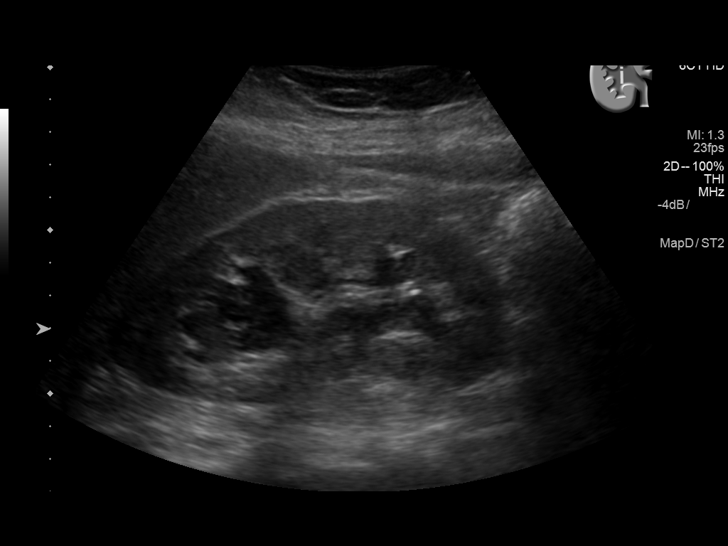
[im 14/54]
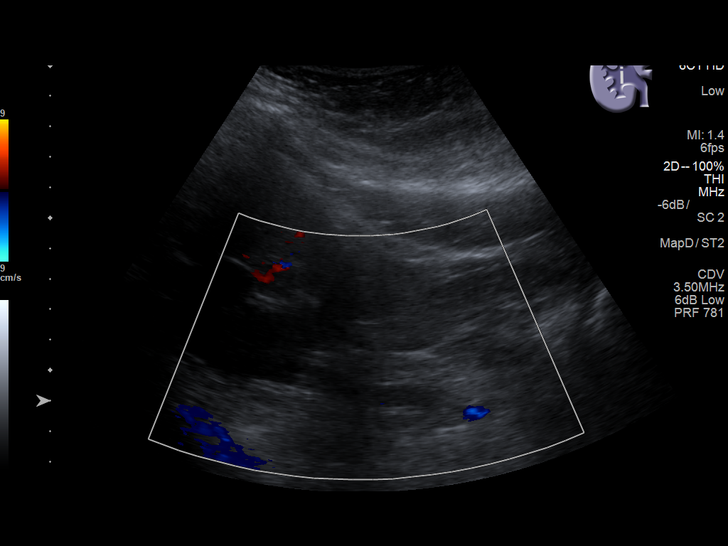
[im 18/54]
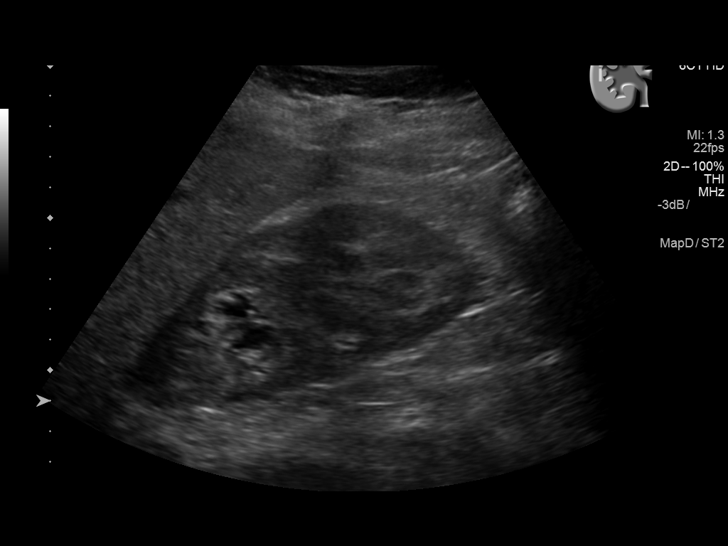
[im 20/54]
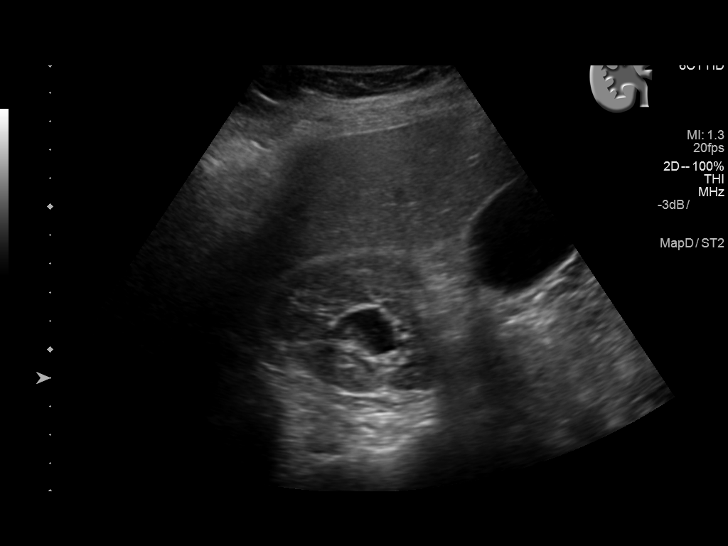
[im 25/54]
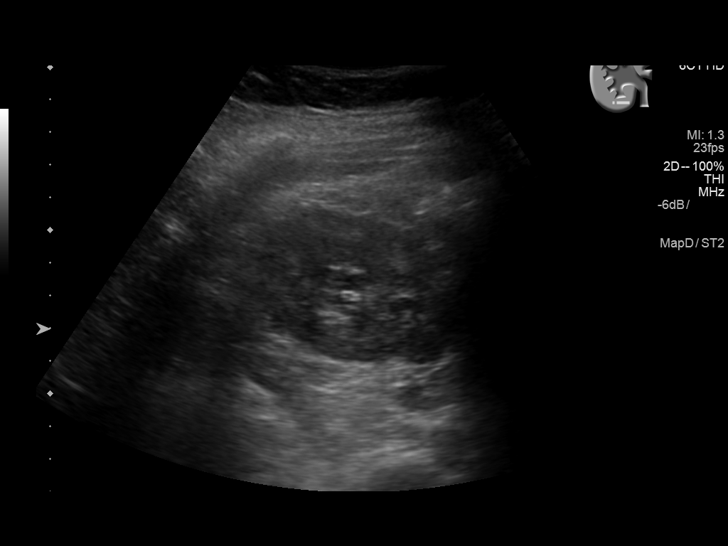
[im 29/54]
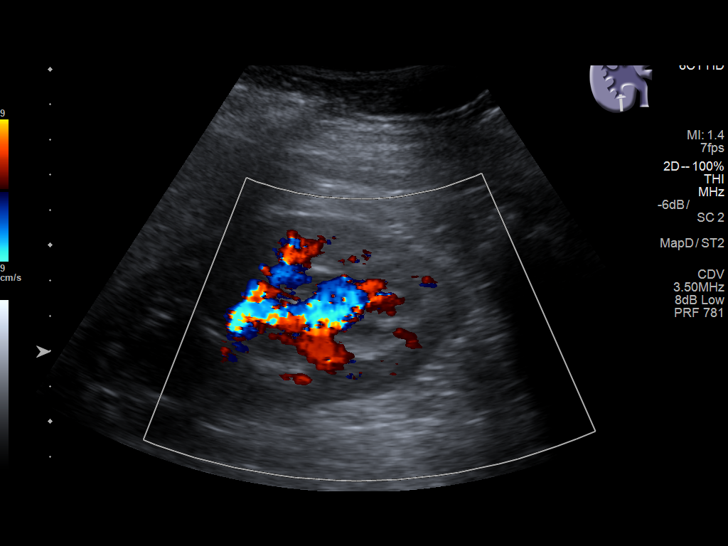
[im 34/54]
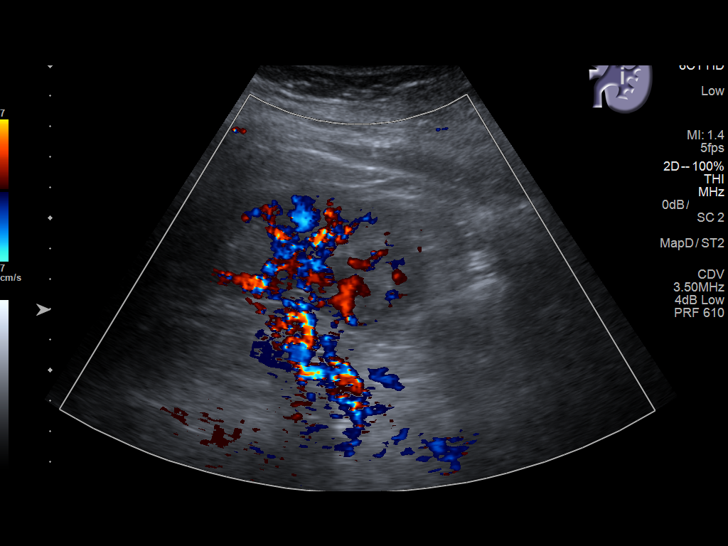
[im 36/54]
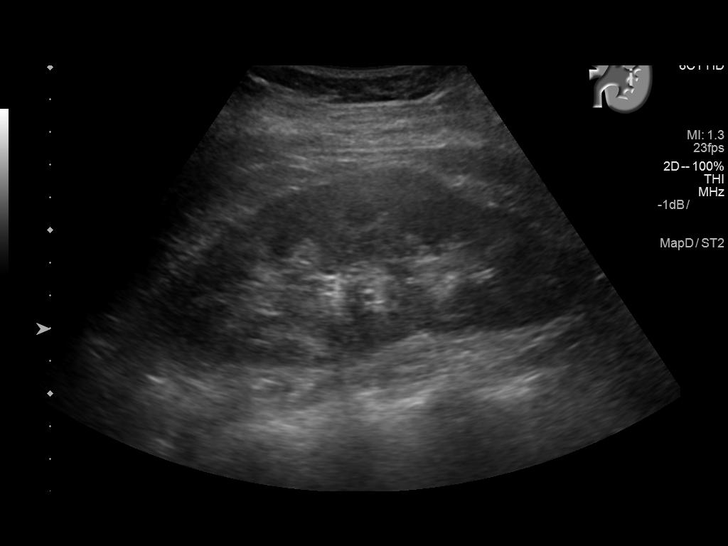
[im 40/54]
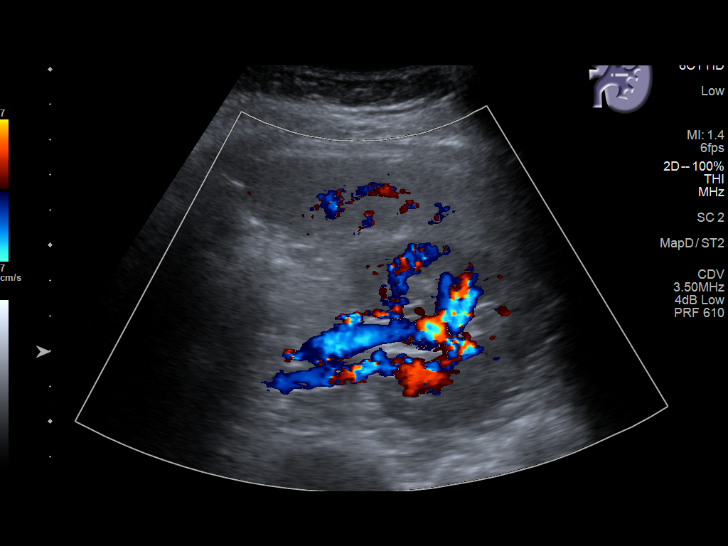
[im 45/54]
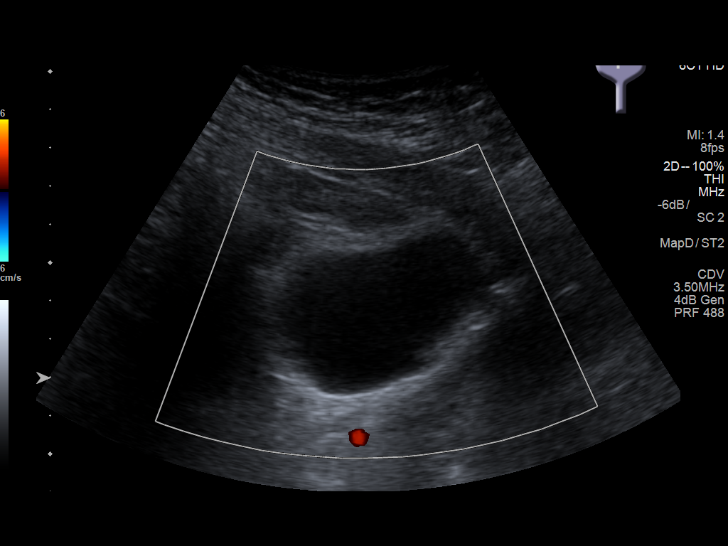
[im 49/54]
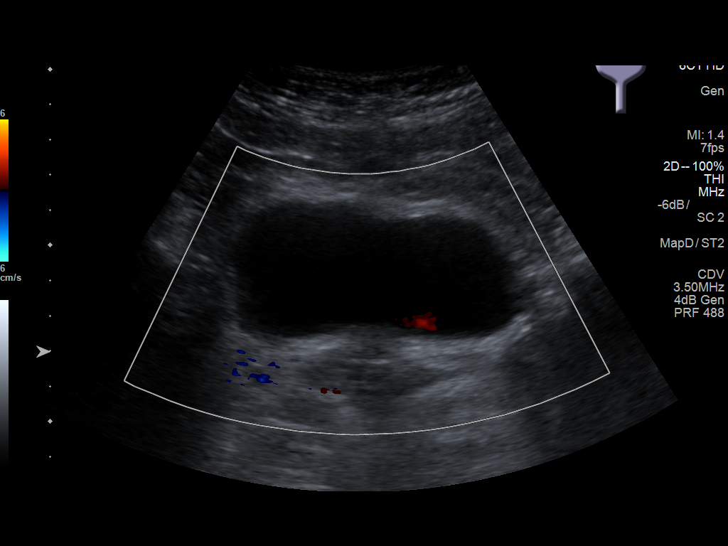
[im 54/54]
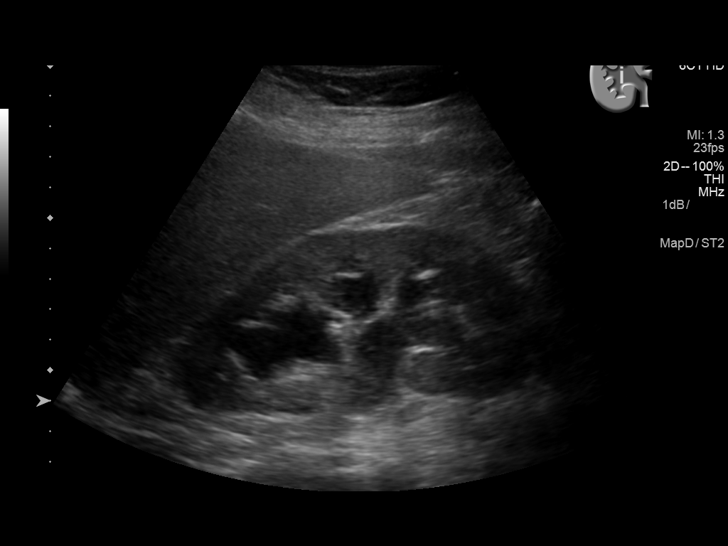

[14 of 25 positions shown; findings below may reference images not displayed]

FINDINGS: Right Kidney:

Length: 11.8 cm. Echogenicity within normal limits. No mass
visualized. Stable moderate hydronephrosis is noted. Echogenic
material is seen in upper pole calyx or collecting system consistent
with debris or sludge. Possible 7 mm calculus seen in lower pole
collecting system.

Left Kidney:

Length: 12.2 cm. Echogenicity within normal limits. No mass or
hydronephrosis visualized.

Bladder:

Appears normal for degree of bladder distention. Bilateral ureteral
jets are noted. Calculated prevoid volume of 110 mL. Calculated
postvoid volume of 0 mL.
IMPRESSION: Stable moderate right hydronephrosis is noted. Probable debris or
sludge is noted in upper pole collecting system, with possible
nonobstructive calculus in lower pole collecting system.

## 2019-09-28 IMAGING — US US OB TRANSVAGINAL
2 series · 13 of 28 positions shown · non-contrast
Comparison: None.

CLINICAL DATA: Two day history of right sided pelvic and flank
pain. Quantitative beta HCG of [DATE].

EXAM:
OBSTETRIC <14 WK US AND TRANSVAGINAL OB US
TECHNIQUE: Both transabdominal and transvaginal ultrasound examinations were
performed for complete evaluation of the gestation as well as the
maternal uterus, adnexal regions, and pelvic cul-de-sac.
Transvaginal technique was performed to assess early pregnancy.

[Series 1: us ob transvaginal · 0.22mm/px · 5 of 33 slices shown (1 of 2)]
[im 4/33]
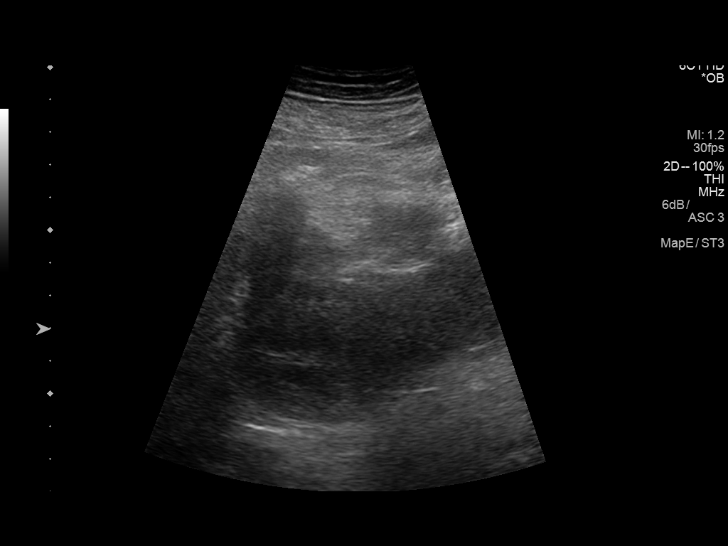
[im 10/33]
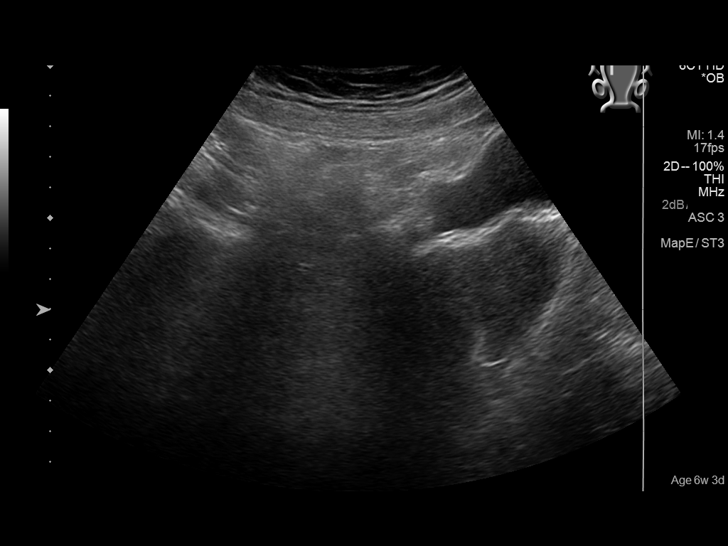
[im 17/33]
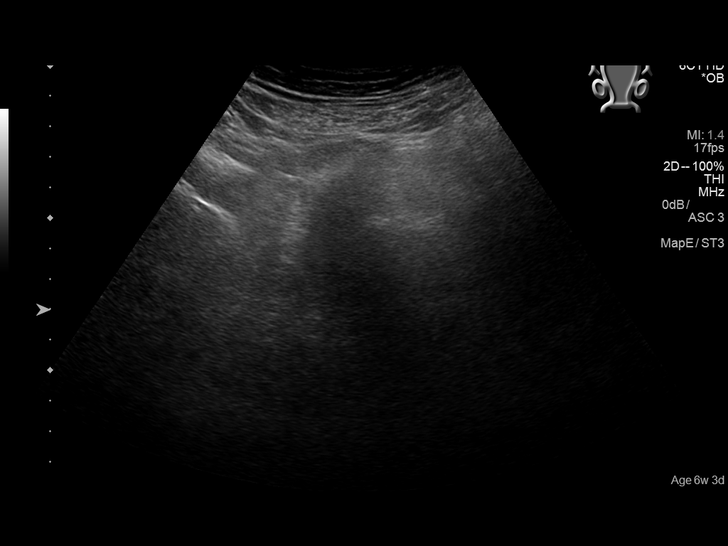
[im 23/33]
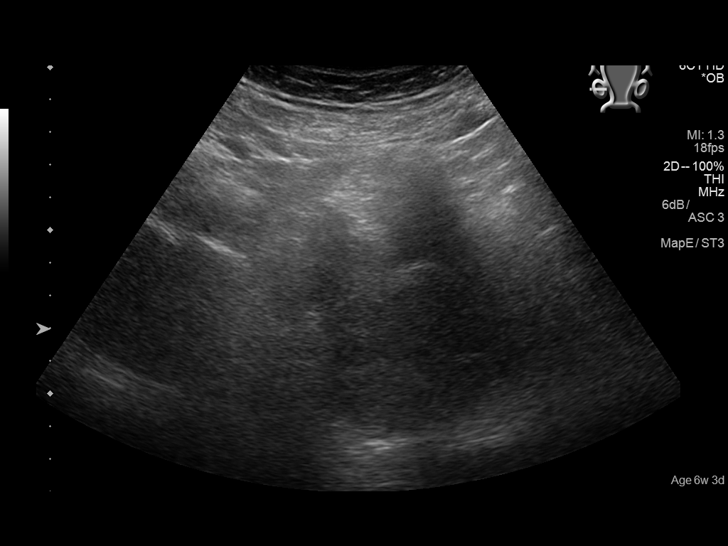
[im 29/33]
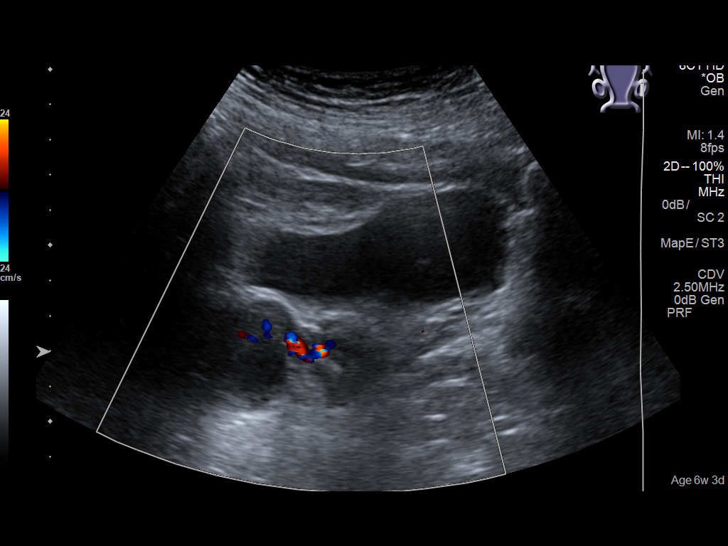

[Series 1001: us ob transvaginal · 0.12mm/px · 52 acquisitions, 8 frames shown (2 of 2)]
[im 1/52]
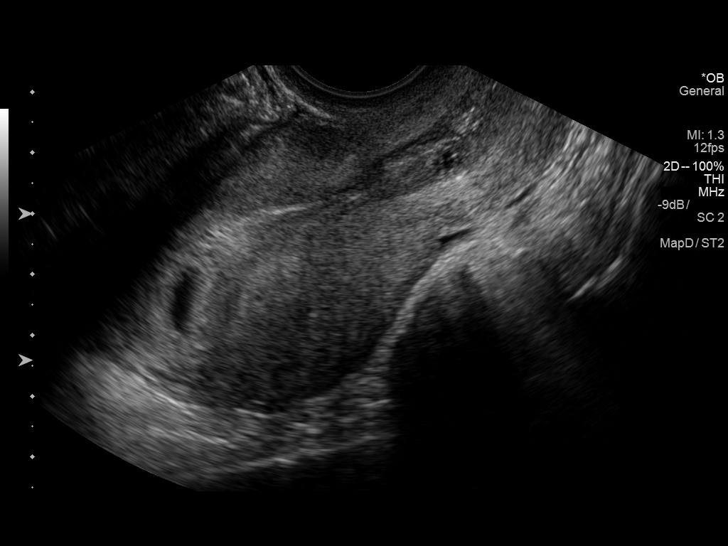
[im 10/52]
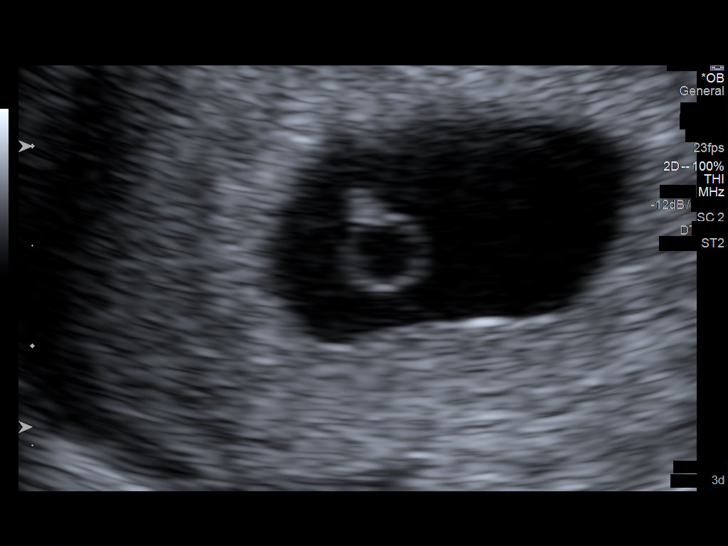
[im 16/52]
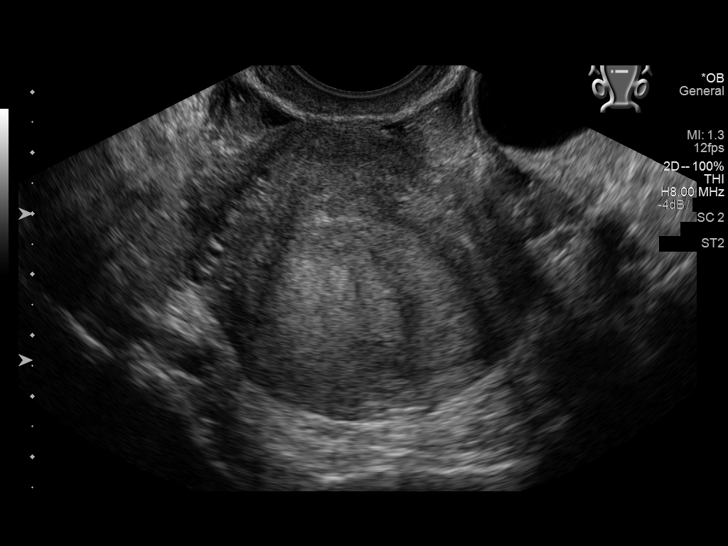
[im 23/52]
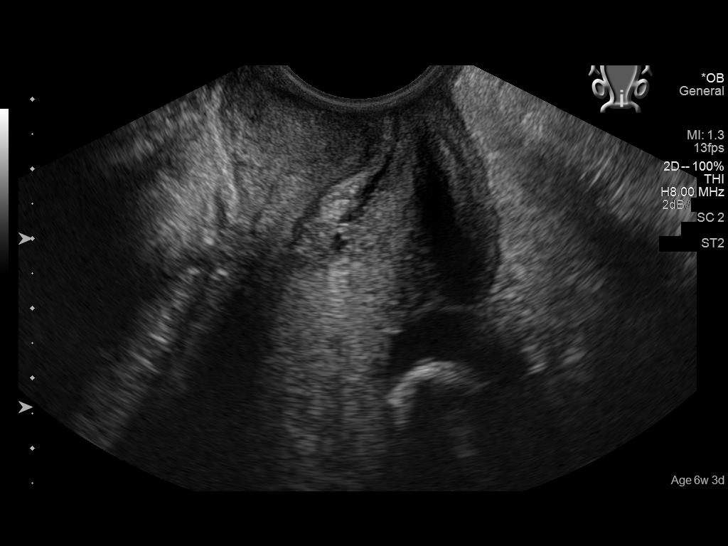
[im 29/52]
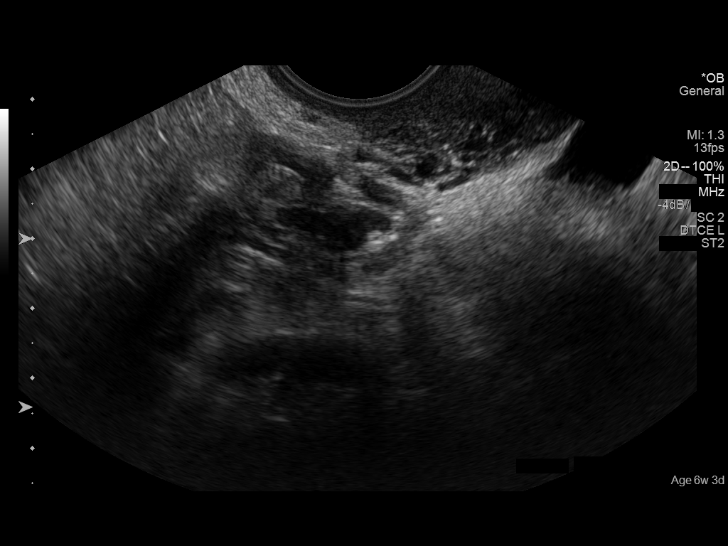
[im 36/52]
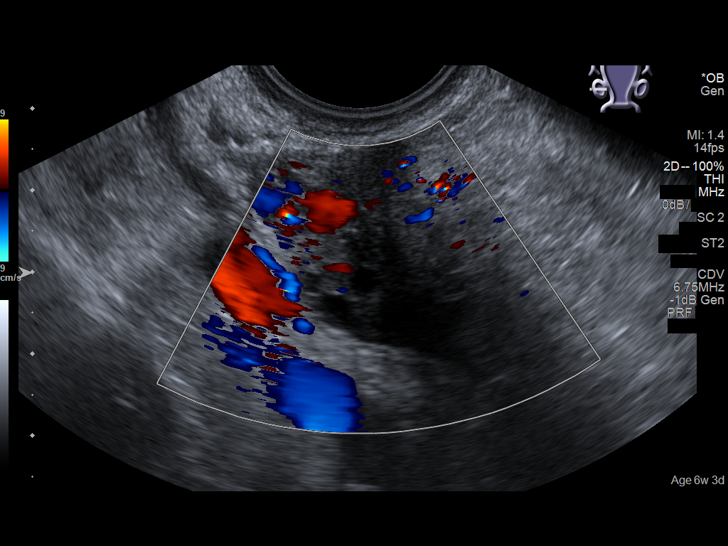
[im 42/52]
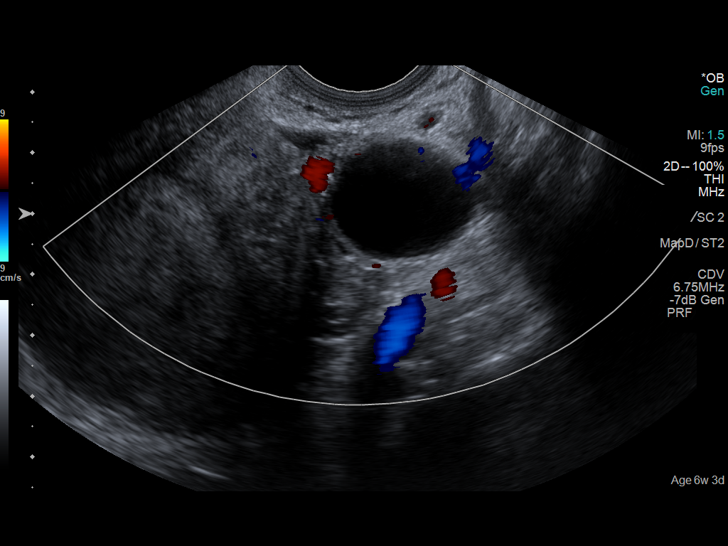
[im 48/52]
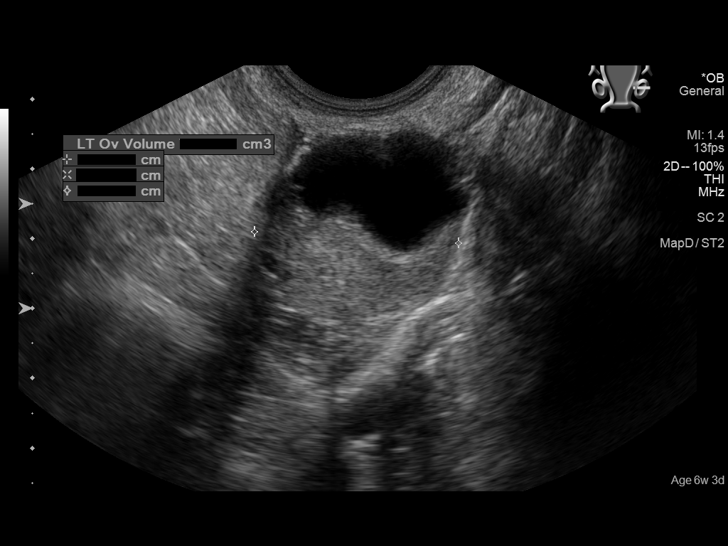

[13 of 28 positions shown; findings below may reference images not displayed]

FINDINGS: Intrauterine gestational sac: Single

Yolk sac:  Present

Embryo:  Present

Cardiac Activity: Present

Heart Rate: 162 bpm

CRL:  2.4 mm   5 w   6 d                  US EDC: October 20, 2018

Subchorionic hemorrhage:  None visualized.

Maternal uterus/adnexae: The ovaries are normal in echotexture. The
left ovary measures 4.1 x 2.8 x 2.9 cm and contains a cyst measuring
2.5 x 2.1 x 2.7 cm. There is a trace of free pelvic fluid.
IMPRESSION: Single viable IUP with estimated gestational age of 5 weeks 6 days
with estimated date of confinement October 20, 2018. No subchorionic
hemorrhage is observed.

Simple appearing cyst in the left ovary measuring 2.5 x 2.1 x
cm. It demonstrates no abnormal vascularity. Normal appearing right
ovary.

Trace of free pelvic fluid.

## 2019-10-16 ENCOUNTER — Ambulatory Visit: Payer: Medicaid Other | Admitting: Dermatology

## 2019-10-16 ENCOUNTER — Other Ambulatory Visit: Payer: Self-pay

## 2019-10-16 VITALS — BP 110/74

## 2019-10-16 DIAGNOSIS — L700 Acne vulgaris: Secondary | ICD-10-CM

## 2019-10-16 MED ORDER — CLINDAMYCIN PHOS-BENZOYL PEROX 1.2-5 % EX GEL: CUTANEOUS | 2 refills | Status: DC

## 2019-10-16 MED ORDER — DIFFERIN 0.3 % EX GEL: CUTANEOUS | 2 refills | Status: DC

## 2019-10-16 MED ORDER — SPIRONOLACTONE 100 MG PO TABS: 100.0000 mg | ORAL_TABLET | Freq: Every day | ORAL | 2 refills | Status: DC

## 2019-10-16 MED ORDER — CLINDAMYCIN PHOS-BENZOYL PEROX 1-5 % EX GEL: CUTANEOUS | 2 refills | Status: DC

## 2019-10-16 MED ORDER — ADAPALENE 0.3 % EX GEL: CUTANEOUS | 2 refills | Status: DC

## 2019-10-16 NOTE — Progress Notes (Signed)
   Follow-Up Visit   Subjective  Evelyn Caldwell is a 29 y.o. female who presents for the following: Acne (face, chest, back). Patient decided she does not want to start isotretinoin at this time. She would prefer to try something else first. She has not used any prescription medications in the past for acne. She uses [employer]. Patient has IUD for birth control.   The following portions of the chart were reviewed this encounter and updated as appropriate:      Review of Systems:  No other skin or systemic complaints except as noted in HPI or Assessment and Plan.  Objective  Well appearing patient in no apparent distress; mood and affect are within normal limits.  A focused examination was performed including face, chest, back. Relevant physical exam findings are noted in the Assessment and Plan.  Objective  Face, chest, back: Scattered inflammatory papules on cheeks, chin, jaw, back; hyperpigmented macules on cheeks, back c/w superficial acne scarring. BP 110/74   Assessment & Plan  Acne vulgaris Face, chest, back  With PIH, patient not interested in isotretinoin at this time.  May consider in fall if not improving Start Spironolactone 100mg  take 1 po QD dsp #30 (start with 1/2 pill for the first 1-2 weeks)  Spironolactone can cause increased urination and cause blood pressure to decrease. Please watch for signs of lightheadedness and be cautious when changing position. It can sometimes cause breast tenderness or an irregular period in premenopausal women. It can also increase potassium. The increase in potassium usually is not a concern unless you are taking other medicines that also increase potassium, so please be sure your doctor knows all of the other medications you are taking. This medication should not be taken  by pregnant women.  Start Differin 0.3% gel Apply to face, chest, shoulders QHS dsp 45g   Topical retinoid medications like  tretinoin/Retin-A, adapalene/Differin, tazarotene/Fabior, and Epiduo/Epiduo Forte can cause dryness and irritation when first started. Only apply a pea-sized amount to the entire affected area. Avoid applying it around the eyes, edges of mouth and creases at the nose. If you experience irritation, use a good moisturizer first and/or apply the medicine less often. If you are doing well with the medicine, you can increase how often you use it until you are applying every night. Be careful with sun protection while using this medication as it can make you sensitive to the sun. This medicine should not be used by pregnant women.   Start Duac Gel (generic) Apply to face QAM dsp 50g 3Rf  Benzoyl peroxide can cause dryness and irritation of the skin. It can also bleach fabric. When used together with Aczone (dapsone) cream, it can stain the skin orange.  Start [employer] 4% Benzoyl Peroxide to face, chest, back in shower. Let sit a few mins before rinsing. Sample given.  Sunscreen daily  spironolactone (ALDACTONE) 100 MG tablet - Face, chest, back  DIFFERIN 0.3 % gel - Face, chest, back  Clindamycin-Benzoyl Per, Refr, (DUAC) gel - Face, chest, back  Return in about 3 months (around 01/16/2020) for acne with Dr. Laurence Ferrari.   IJamesetta Orleans, CMA, am acting as scribe for Brendolyn Patty, MD .  Documentation: I have reviewed the above documentation for accuracy and completeness, and I agree with the above.  Brendolyn Patty MD

## 2019-10-16 NOTE — Patient Instructions (Addendum)
Spironolactone can cause increased urination and cause blood pressure to decrease. Please watch for signs of lightheadedness and be cautious when changing position. It can sometimes cause breast tenderness or an irregular period in premenopausal women. It can also increase potassium. The increase in potassium usually is not a concern unless you are taking other medicines that also increase potassium, so please be sure your doctor knows all of the other medications you are taking. This medication should not be taken  by pregnant women.  Topical retinoid medications like tretinoin/Retin-A, adapalene/Differin, tazarotene/Fabior, and Epiduo/Epiduo Forte can cause dryness and irritation when first started. Only apply a pea-sized amount to the entire affected area. Avoid applying it around the eyes, edges of mouth and creases at the nose. If you experience irritation, use a good moisturizer first and/or apply the medicine less often. If you are doing well with the medicine, you can increase how often you use it until you are applying every night. Be careful with sun protection while using this medication as it can make you sensitive to the sun. This medicine should not be used by pregnant women.   Benzoyl peroxide can cause dryness and irritation of the skin. It can also bleach fabric.

## 2019-10-24 ENCOUNTER — Encounter: Payer: Self-pay | Admitting: Dermatology

## 2019-10-24 ENCOUNTER — Other Ambulatory Visit: Payer: Self-pay

## 2019-10-24 MED ORDER — DIFFERIN 0.1 % EX CREA: TOPICAL_CREAM | Freq: Every day | CUTANEOUS | 2 refills | Status: DC

## 2019-10-24 NOTE — Progress Notes (Signed)
Had to resend Differin gel because pharmacy said it was not DAW.  I sent in Differin 0.3% gel qhs to face for acne, 45g, 2rf DAW to [facility] on [address]sh

## 2019-10-25 ENCOUNTER — Ambulatory Visit: Payer: Medicaid Other | Admitting: Dermatology

## 2019-12-25 ENCOUNTER — Other Ambulatory Visit (INDEPENDENT_AMBULATORY_CARE_PROVIDER_SITE_OTHER): Payer: Medicaid Other

## 2019-12-25 ENCOUNTER — Ambulatory Visit (INDEPENDENT_AMBULATORY_CARE_PROVIDER_SITE_OTHER): Payer: Medicaid Other | Admitting: Certified Nurse Midwife

## 2019-12-25 ENCOUNTER — Encounter: Payer: Self-pay | Admitting: Certified Nurse Midwife

## 2019-12-25 ENCOUNTER — Other Ambulatory Visit: Payer: Self-pay

## 2019-12-25 VITALS — BP 95/70 | HR 85 | Ht 67.0 in | Wt 166.4 lb

## 2019-12-25 DIAGNOSIS — N926 Irregular menstruation, unspecified: Secondary | ICD-10-CM | POA: Diagnosis not present

## 2019-12-25 DIAGNOSIS — T8332XA Displacement of intrauterine contraceptive device, initial encounter: Secondary | ICD-10-CM

## 2019-12-25 LAB — POCT URINE PREGNANCY: Preg Test, Ur: NEGATIVE

## 2019-12-25 NOTE — Progress Notes (Signed)
Mirena placed 11/2018.  Cant feel the strings. Cut longer. No Heavy bleeding. Mild cramps.

## 2019-12-25 NOTE — Progress Notes (Signed)
   GYNECOLOGY OFFICE ENCOUNTER NOTE  History:  29 y.o. [id number] here today for today for IUD string check; Mirena  IUD was placed  12/15/18. she could not feel strings and missed her period this past month, She has had regular periods since having IUD placed.   The following portions of the patient's history were reviewed and updated as appropriate: allergies, current medications, past family history, past medical history, past social history, past surgical history and problem list. Last pap smear on 08/18/2016 was normal, negative.  Review of Systems:  Pertinent items are noted in HPI.   Objective:  Physical Exam Blood pressure 95/70, pulse 85, height 5\' 7"  (1.702 m), weight 166 lb 6.4 oz (75.5 kg), last menstrual period 11/10/2019, not currently breastfeeding. CONSTITUTIONAL: Well-developed, well-nourished female in no acute distress.  HENT:  Normocephalic, atraumatic. External right and left ear normal. Oropharynx is clear and moist EYES: Conjunctivae and EOM are normal. Pupils are equal, round, and reactive to light. No scleral icterus.  NECK: Normal range of motion, supple, no masses CARDIOVASCULAR: Normal heart rate noted RESPIRATORY: Effort and breath sounds normal, no problems with respiration noted ABDOMEN: Soft, no distention noted.   PELVIC: Normal appearing external genitalia; normal appearing vaginal mucosa and cervix.  IUD strings not visualized, string finder used , not able to get strings.    Patient Name: Evelyn Caldwell DOB: [birth year] MRN: [id number] ULTRASOUND REPORT  Location: [facility]  Date of Service: 12/25/2019     Indications:IUD check,  Findings:  The uterus is anteverted and measures 7.4 x 3.5 x 4.0 cm. Echo texture is homogenous without evidence of focal masses.  The Endometrium measures 5 mm. IUD is properly position with-in the endometrium.  Right Ovary measures 4.4 x 2.0 x 2.4 cm. It is normal in appearance. Left Ovary measures 2.9 x  1.5 x 1.9 cm. It is normal in appearance. Survey of the adnexa demonstrates no adnexal masses. There is no free fluid in the cul de sac.  Impression: 1. IUD is properly position with-in the endometrium. Recommendations: 1.Clinical correlation with the patient's History and Physical Exam.   Jenine M. Alessi    RDMS Assessment & Plan:  U/s ordered for IUD placement. IUD is in the correct position.    Deneise Lever Salahuddin Arismendez,CNM

## 2020-02-12 ENCOUNTER — Other Ambulatory Visit: Payer: Self-pay

## 2020-02-12 ENCOUNTER — Encounter: Payer: Self-pay | Admitting: Dermatology

## 2020-02-12 ENCOUNTER — Ambulatory Visit (INDEPENDENT_AMBULATORY_CARE_PROVIDER_SITE_OTHER): Payer: Medicaid Other | Admitting: Dermatology

## 2020-02-12 VITALS — BP 114/71

## 2020-02-12 DIAGNOSIS — L700 Acne vulgaris: Secondary | ICD-10-CM | POA: Diagnosis not present

## 2020-02-12 MED ORDER — DIFFERIN 0.3 % EX GEL: CUTANEOUS | 2 refills | Status: DC

## 2020-02-12 MED ORDER — SPIRONOLACTONE 100 MG PO TABS: 100.0000 mg | ORAL_TABLET | Freq: Every day | ORAL | 3 refills | Status: DC

## 2020-02-12 NOTE — Progress Notes (Signed)
RX switched to [employer].

## 2020-02-12 NOTE — Progress Notes (Signed)
   Follow-Up Visit   Subjective  Evelyn Caldwell is a 29 y.o. female who presents for the following: Acne (3 month follow-up). She is using Differin 0.1% Cream at night, (insurance won't cover 0.3% gel). Clindamycin-Benzoyl peroxide in the morning, and taking Spironolactone 100mg  daily. She has improved some, but still gets breakouts. Patient is tolerating meds ok. No dizziness.   The following portions of the chart were reviewed this encounter and updated as appropriate:      Review of Systems:  No other skin or systemic complaints except as noted in HPI or Assessment and Plan.  Objective  Well appearing patient in no apparent distress; mood and affect are within normal limits.  A focused examination was performed including face. Relevant physical exam findings are noted in the Assessment and Plan.  Objective  Face, Upper back: Cyst on left upper neck (persistent per pt); violaceous macules on cheeks; few small resolving inflammatory papules on right jaw; few inflamed comedones on upper back. BP 114/71   Assessment & Plan  Acne vulgaris Face, Upper back  Improving  Differin 0.3% Gel Apply QHS to face as tolerated dsp 45g 2Rf Continue Spironolactone 100mg  take 1 po QD dsp #30 3Rf. Continue Clindamycin-Benzoyl Peroxide Gel QAM.  Spironolactone can cause increased urination and cause blood pressure to decrease. Please watch for signs of lightheadedness and be cautious when changing position. It can sometimes cause breast tenderness or an irregular period in premenopausal women. It can also increase potassium. The increase in potassium usually is not a concern unless you are taking other medicines that also increase potassium, so please be sure your doctor knows all of the other medications you are taking. This medication should not be taken  by pregnant women.  Topical retinoid medications like tretinoin/Retin-A, adapalene/Differin, tazarotene/Fabior, and Epiduo/Epiduo Forte  can cause dryness and irritation when first started. Only apply a pea-sized amount to the entire affected area. Avoid applying it around the eyes, edges of mouth and creases at the nose. If you experience irritation, use a good moisturizer first and/or apply the medicine less often. If you are doing well with the medicine, you can increase how often you use it until you are applying every night. Be careful with sun protection while using this medication as it can make you sensitive to the sun. This medicine should not be used by pregnant women.    Reordered Medications spironolactone (ALDACTONE) 100 MG tablet  Other Related Medications Clindamycin-Benzoyl Per, Refr, (DUAC) gel DIFFERIN 0.3 % gel  Return in about 4 months (around 06/13/2020) for Acne.   IJamesetta Orleans, CMA, am acting as scribe for Brendolyn Patty, MD .  Documentation: I have reviewed the above documentation for accuracy and completeness, and I agree with the above.  Brendolyn Patty MD

## 2020-04-01 ENCOUNTER — Other Ambulatory Visit: Payer: Self-pay

## 2020-04-01 MED ORDER — CLINDAMYCIN PHOS-BENZOYL PEROX 1.2-5 % EX GEL: CUTANEOUS | 3 refills | Status: DC

## 2020-04-01 NOTE — Progress Notes (Signed)
rx refill

## 2020-06-03 ENCOUNTER — Ambulatory Visit: Payer: Medicaid Other | Admitting: Dermatology

## 2020-06-09 ENCOUNTER — Ambulatory Visit: Payer: Medicaid Other | Admitting: Dermatology

## 2020-07-01 ENCOUNTER — Encounter: Payer: Self-pay | Admitting: Certified Nurse Midwife

## 2020-07-01 ENCOUNTER — Other Ambulatory Visit: Payer: Self-pay

## 2020-07-01 ENCOUNTER — Ambulatory Visit (INDEPENDENT_AMBULATORY_CARE_PROVIDER_SITE_OTHER): Payer: Medicaid Other | Admitting: Certified Nurse Midwife

## 2020-07-01 VITALS — BP 101/63 | HR 88 | Ht 67.0 in | Wt 180.1 lb

## 2020-07-01 DIAGNOSIS — Z01419 Encounter for gynecological examination (general) (routine) without abnormal findings: Secondary | ICD-10-CM | POA: Diagnosis not present

## 2020-07-01 LAB — CYTOLOGY - PAP: Diagnosis: NEGATIVE

## 2020-07-01 MED ORDER — FLUOXETINE HCL 10 MG PO CAPS: 10.0000 mg | ORAL_CAPSULE | Freq: Every day | ORAL | 1 refills | Status: DC

## 2020-07-01 NOTE — Patient Instructions (Signed)
Preventive Care 21-31 Years Old, Female Preventive care refers to lifestyle choices and visits with your health care provider that can promote health and wellness. This includes:  A yearly physical exam. This is also called an annual wellness visit.  Regular dental and eye exams.  Immunizations.  Screening for certain conditions.  Healthy lifestyle choices, such as: ? Eating a healthy diet. ? Getting regular exercise. ? Not using drugs or products that contain nicotine and tobacco. ? Limiting alcohol use. What can I expect for my preventive care visit? Physical exam Your health care provider may check your:  Height and weight. These may be used to calculate your BMI (body mass index). BMI is a measurement that tells if you are at a healthy weight.  Heart rate and blood pressure.  Body temperature.  Skin for abnormal spots. Counseling Your health care provider may ask you questions about your:  Past medical problems.  Family's medical history.  Alcohol, tobacco, and drug use.  Emotional well-being.  Home life and relationship well-being.  Sexual activity.  Diet, exercise, and sleep habits.  Work and work environment.  Access to firearms.  Method of birth control.  Menstrual cycle.  Pregnancy history. What immunizations do I need? Vaccines are usually given at various ages, according to a schedule. Your health care provider will recommend vaccines for you based on your age, medical history, and lifestyle or other factors, such as travel or where you work.   What tests do I need? Blood tests  Lipid and cholesterol levels. These may be checked every 5 years starting at age 20.  Hepatitis C test.  Hepatitis B test. Screening  Diabetes screening. This is done by checking your blood sugar (glucose) after you have not eaten for a while (fasting).  STD (sexually transmitted disease) testing, if you are at risk.  BRCA-related cancer screening. This may be  done if you have a family history of breast, ovarian, tubal, or peritoneal cancers.  Pelvic exam and Pap test. This may be done every 3 years starting at age 21. Starting at age 30, this may be done every 5 years if you have a Pap test in combination with an HPV test. Talk with your health care provider about your test results, treatment options, and if necessary, the need for more tests.   Follow these instructions at home: Eating and drinking  Eat a healthy diet that includes fresh fruits and vegetables, whole grains, lean protein, and low-fat dairy products.  Take vitamin and mineral supplements as recommended by your health care provider.  Do not drink alcohol if: ? Your health care provider tells you not to drink. ? You are pregnant, may be pregnant, or are planning to become pregnant.  If you drink alcohol: ? Limit how much you have to 0-1 drink a day. ? Be aware of how much alcohol is in your drink. In the [place] one drink equals one 12 oz bottle of beer (355 mL), one 5 oz glass of wine (148 mL), or one 1 oz glass of hard liquor (44 mL).   Lifestyle  Take daily care of your teeth and gums. Brush your teeth every morning and night with fluoride toothpaste. Floss one time each day.  Stay active. Exercise for at least 30 minutes 5 or more days each week.  Do not use any products that contain nicotine or tobacco, such as cigarettes, e-cigarettes, and chewing tobacco. If you need help quitting, ask your health care provider.  Do not   use drugs.  If you are sexually active, practice safe sex. Use a condom or other form of protection to prevent STIs (sexually transmitted infections).  If you do not wish to become pregnant, use a form of birth control. If you plan to become pregnant, see your health care provider for a prepregnancy visit.  Find healthy ways to cope with stress, such as: ? Meditation, yoga, or listening to music. ? Journaling. ? Talking to a trusted  person. ? Spending time with friends and family. Safety  Always wear your seat belt while driving or riding in a vehicle.  Do not drive: ? If you have been drinking alcohol. Do not ride with someone who has been drinking. ? When you are tired or distracted. ? While texting.  Wear a helmet and other protective equipment during sports activities.  If you have firearms in your house, make sure you follow all gun safety procedures.  Seek help if you have been physically or sexually abused. What's next?  Go to your health care provider once a year for an annual wellness visit.  Ask your health care provider how often you should have your eyes and teeth checked.  Stay up to date on all vaccines. This information is not intended to replace advice given to you by your health care provider. Make sure you discuss any questions you have with your health care provider. Document Revised: 01/13/2020 Document Reviewed: 01/26/2018 Elsevier Patient Education  2021 Elsevier Inc.  

## 2020-07-01 NOTE — Progress Notes (Signed)
GYNECOLOGY ANNUAL PREVENTATIVE CARE ENCOUNTER NOTE  History:     Evelyn Caldwell is a 30 y.o. [phone] female here for a routine annual gynecologic exam.  Current complaints: short tempered. States she thought it was due to having a new baby but this has not changed since the baby was born. .   Denies abnormal vaginal bleeding, discharge, pelvic pain, problems with intercourse or other gynecologic concerns.     Social Relationship: married  Living: with spouses and children Work: stay at home mom Exercise: none Smoke/Alcohol/drug use: occ alcohol use, denies smoking and drug use.  Gynecologic History No LMP recorded. (Menstrual status: IUD). Contraception: IUD Last Pap: 06/20/19 Results were: normal  Last mammogram: n/a  Obstetric History OB History  Gravida Para Term Preterm AB Living  2 2 2     2   SAB IAB Ectopic Multiple Live Births        0 2    # Outcome Date GA Lbr Len/2nd Weight Sex Delivery Anes PTL Lv  2 Term 10/18/18 [redacted]w[redacted]d 06:30 / 01:49 8 lb 12.4 oz (3.98 kg) F Vag-Spont EPI  LIV  1 Term 03/09/16 [redacted]w[redacted]d  8 lb 1.6 oz (3.674 kg) F Vag-Spont   LIV    Past Medical History:  Diagnosis Date  . Kidney stone   . Vulvar lesion       Current Outpatient Medications on File Prior to Visit  Medication Sig Dispense Refill  . Clindamycin-Benzoyl Per, Refr, (DUAC) gel Apply to face every morning for acne. 45 g 2  . DIFFERIN 0.3 % gel Apply a pea-sized amount to face and shoulders every night as tolerated. 45 g 2  . spironolactone (ALDACTONE) 100 MG tablet Take 1 tablet (100 mg total) by mouth daily. 30 tablet 3   No current facility-administered medications on file prior to visit.    No Known Allergies  Social History:  reports that she has never smoked. She has never used smokeless tobacco. She reports current alcohol use. She reports that she does not use drugs.  Family History  Problem Relation Age of Onset  . Congestive Heart Failure Maternal  Grandfather   . Parkinson's disease Paternal Grandmother   . Kidney failure Cousin   . Kidney cancer Neg Hx   . Prostate cancer Neg Hx     The following portions of the patient's history were reviewed and updated as appropriate: allergies, current medications, past family history, past medical history, past social history, past surgical history and problem list.  Review of Systems Pertinent items noted in HPI and remainder of comprehensive ROS otherwise negative.  Physical Exam:  BP 101/63   Pulse 88   Ht 5\' 7"  (1.702 m)   Wt 180 lb 2 oz (81.7 kg)   Breastfeeding No   BMI 28.21 kg/m  CONSTITUTIONAL: Well-developed, well-nourished female in no acute distress.  HENT:  Normocephalic, atraumatic, External right and left ear normal. Oropharynx is clear and moist EYES: Conjunctivae and EOM are normal. Pupils are equal, round, and reactive to light. No scleral icterus.  NECK: Normal range of motion, supple, no masses.  Normal thyroid.  SKIN: Skin is warm and dry. No rash noted. Not diaphoretic. No erythema. No pallor. MUSCULOSKELETAL: Normal range of motion. No tenderness.  No cyanosis, clubbing, or edema.  2+ distal pulses. NEUROLOGIC: Alert and oriented to person, place, and time. Normal reflexes, muscle tone coordination.  PSYCHIATRIC: Normal mood and affect. Normal behavior. Normal judgment and thought content. CARDIOVASCULAR: Normal heart rate  noted, regular rhythm RESPIRATORY: Clear to auscultation bilaterally. Effort and breath sounds normal, no problems with respiration noted. BREASTS: Symmetric in size. No masses, tenderness, skin changes, nipple drainage, or lymphadenopathy bilaterally.  ABDOMEN: Soft, no distention noted.  No tenderness, rebound or guarding.  PELVIC: Normal appearing external genitalia and urethral meatus; normal appearing vaginal mucosa and cervix.  No abnormal discharge noted.  Pap smear not due.  Normal uterine size, no other palpable masses, no uterine or  adnexal tenderness.  Marland Kitchen     GAD 7 : Generalized Anxiety Score 07/01/2020 12/15/2018  Nervous, Anxious, on Edge 3 1  Control/stop worrying 1 1  Worry too much - different things 3 0  Trouble relaxing 3 1  Restless 1 0  Easily annoyed or irritable 3 1  Afraid - awful might happen 1 0  Total GAD 7 Score 15 4  Anxiety Difficulty Very difficult Somewhat difficult     Assessment and Plan:    1. Women's annual routine gynecological examination  Pap: not due  Mammogram : n/a Labs:declines hep c. Refills/ orders: prozac Referral: none Routine preventative health maintenance measures emphasized. Please refer to After Visit Summary for other counseling recommendations.      Philip Aspen, CNM [facility]

## 2020-07-04 ENCOUNTER — Encounter: Payer: Self-pay | Admitting: Dermatology

## 2020-07-07 ENCOUNTER — Other Ambulatory Visit: Payer: Self-pay

## 2020-07-07 DIAGNOSIS — L700 Acne vulgaris: Secondary | ICD-10-CM

## 2020-07-07 MED ORDER — SPIRONOLACTONE 100 MG PO TABS: 100.0000 mg | ORAL_TABLET | Freq: Every day | ORAL | 0 refills | Status: DC

## 2020-07-07 NOTE — Progress Notes (Signed)
1 refill of spironolactone.  Pt has f/u appt 07/21/20./sh

## 2020-07-21 ENCOUNTER — Ambulatory Visit: Payer: Medicaid Other | Admitting: Dermatology

## 2020-07-21 ENCOUNTER — Other Ambulatory Visit: Payer: Self-pay

## 2020-07-21 DIAGNOSIS — L700 Acne vulgaris: Secondary | ICD-10-CM | POA: Diagnosis not present

## 2020-07-21 MED ORDER — SPIRONOLACTONE 100 MG PO TABS: 100.0000 mg | ORAL_TABLET | Freq: Every day | ORAL | 5 refills | Status: DC

## 2020-07-21 MED ORDER — DOXYCYCLINE MONOHYDRATE 100 MG PO CAPS: 100.0000 mg | ORAL_CAPSULE | Freq: Every day | ORAL | 1 refills | Status: DC

## 2020-07-21 NOTE — Progress Notes (Signed)
   Follow-Up Visit   Subjective  Evelyn Caldwell is a 30 y.o. female who presents for the following: Acne (Face, back, Differin 0.3% gel prn, Spironolactone 100mg  1 po qd, clindamycin/bp gel prn, was doing good but had some burning and stopped everything, d/c Cerave SA cleanser and burning stopped so she restarted the spironolactone). She had a breakout while off medication.  No side effects from Spironolactone   The following portions of the chart were reviewed this encounter and updated as appropriate:       Review of Systems:  No other skin or systemic complaints except as noted in HPI or Assessment and Plan.  Objective  Well appearing patient in no apparent distress; mood and affect are within normal limits.  A focused examination was performed including face. Relevant physical exam findings are noted in the Assessment and Plan.  Objective  face, back: Resolving inflammatory paps jaw, upper neck, L cheek x 1, R cheek x 1, resolving inflammatory paps upper back, mid back   Assessment & Plan  Acne vulgaris face, back  With flare off of medication BP today 100/67 Cont Spironolactone 100mg  1 po qd Restart Differin 0.3% gel qhs as tolerated Recommend applying hydraboost moisturizer on before applying Differin After a few weeks on Differin, restart Clindamycin/BP gel qam as tolerated Start Doxycycline 100mg  1 po qd with food and drink x 1 mo  Spironolactone can cause increased urination and cause blood pressure to decrease. Please watch for signs of lightheadedness and be cautious when changing position. It can sometimes cause breast tenderness or an irregular period in premenopausal women. It can also increase potassium. The increase in potassium usually is not a concern unless you are taking other medicines that also increase potassium, so please be sure your doctor knows all of the other medications you are taking. This medication should not be taken by pregnant women.   This medicine should also not be taken together with sulfa drugs like Bactrim (trimethoprim/sulfamethexazole).   Topical retinoid medications like tretinoin/Retin-A, adapalene/Differin, tazarotene/Fabior, and Epiduo/Epiduo Forte can cause dryness and irritation when first started. Only apply a pea-sized amount to the entire affected area. Avoid applying it around the eyes, edges of mouth and creases at the nose. If you experience irritation, use a good moisturizer first and/or apply the medicine less often. If you are doing well with the medicine, you can increase how often you use it until you are applying every night. Be careful with sun protection while using this medication as it can make you sensitive to the sun. This medicine should not be used by pregnant women.    Doxycycline should be taken with food to prevent nausea. Do not lay down for 30 minutes after taking. Be cautious with sun exposure and use good sun protection while on this medication. Pregnant women should not take this medication.    doxycycline (MONODOX) 100 MG capsule - face, back  spironolactone (ALDACTONE) 100 MG tablet - face, back  Other Related Medications Clindamycin-Benzoyl Per, Refr, (DUAC) gel DIFFERIN 0.3 % gel spironolactone (ALDACTONE) 100 MG tablet  Return in about 6 months (around 01/18/2021) for acne, sooner if not improving.   I, Othelia Pulling, RMA, am acting as scribe for Brendolyn Patty, MD . Documentation: I have reviewed the above documentation for accuracy and completeness, and I agree with the above.  Brendolyn Patty MD

## 2020-07-21 NOTE — Patient Instructions (Signed)
Restart Differin 0.3% gel nightly as tolerated Recommend applying hydraboost moisturizer on before applying Differin After a few weeks on Differin, restart Clindamycin/BP gel in the morning as tolerated

## 2020-07-22 ENCOUNTER — Ambulatory Visit: Payer: Medicaid Other | Admitting: Dermatology

## 2020-07-29 ENCOUNTER — Encounter: Payer: Self-pay | Admitting: Certified Nurse Midwife

## 2020-07-29 ENCOUNTER — Other Ambulatory Visit: Payer: Self-pay

## 2020-07-29 ENCOUNTER — Ambulatory Visit (INDEPENDENT_AMBULATORY_CARE_PROVIDER_SITE_OTHER): Payer: Medicaid Other | Admitting: Certified Nurse Midwife

## 2020-07-29 DIAGNOSIS — Z79899 Other long term (current) drug therapy: Secondary | ICD-10-CM

## 2020-07-29 MED ORDER — FLUOXETINE HCL 20 MG PO CAPS: 20.0000 mg | ORAL_CAPSULE | Freq: Every day | ORAL | 1 refills | Status: DC

## 2020-07-29 NOTE — Progress Notes (Signed)
Virtual Visit via Telephone Note  I connected with Evelyn Caldwell on 07/29/20 at 11:00 AM EST by telephone and verified that I am speaking with the correct person using two identifiers.  Location: Patient: at home Provider: at office   I discussed the limitations, risks, security and privacy concerns of performing an evaluation and management service by telephone and the availability of in person appointments. I also discussed with the patient that there may be a patient responsible charge related to this service. The patient expressed understanding and agreed to proceed.   History of Present Illness: Pt was started on Prozac 10 mg daily 2/1 for anxiety. She presents today via tele visit for medication check.    Observations/Objective: She states she has noticed some improvement on days but feels like she is having more bad days than good. She feels like she is "snapping at her family. She notes having issues with sleep. She state she sometimes wakes in the night. Recommend use of melatonin . Discussed sleep in relationship to mood. She verbalizes and agrees.   Assessment and Plan: GAD 7 : Generalized Anxiety Score 07/29/2020 07/01/2020 12/15/2018  Nervous, Anxious, on Edge 3 3 1   Control/stop worrying 1 1 1   Worry too much - different things 1 3 0  Trouble relaxing 3 3 1   Restless 1 1 0  Easily annoyed or irritable 3 3 1   Afraid - awful might happen 1 1 0  Total GAD 7 Score 13 15 4   Anxiety Difficulty Not difficult at all Very difficult Somewhat difficult     Follow Up Instructions: Discussed increasing dose to 20 mg daily. She is in agreement. She will follow up in 4 wks for medication check.    I discussed the assessment and treatment plan with the patient. The patient was provided an opportunity to ask questions and all were answered. The patient agreed with the plan and demonstrated an understanding of the instructions.   The patient was advised to call back or seek an  in-person evaluation if the symptoms worsen or if the condition fails to improve as anticipated.  I provided 7 minutes of non-face-to-face time during this encounter.   Philip Aspen, CNM

## 2020-07-29 NOTE — Patient Instructions (Signed)

## 2020-07-30 ENCOUNTER — Encounter: Payer: Self-pay | Admitting: Urology

## 2020-07-30 ENCOUNTER — Other Ambulatory Visit: Payer: Self-pay | Admitting: Otolaryngology

## 2020-07-30 DIAGNOSIS — H93A9 Pulsatile tinnitus, unspecified ear: Secondary | ICD-10-CM

## 2020-07-31 ENCOUNTER — Other Ambulatory Visit: Payer: Self-pay | Admitting: *Deleted

## 2020-07-31 DIAGNOSIS — N200 Calculus of kidney: Secondary | ICD-10-CM

## 2020-08-05 ENCOUNTER — Other Ambulatory Visit: Payer: Self-pay | Admitting: Dermatology

## 2020-08-05 DIAGNOSIS — L700 Acne vulgaris: Secondary | ICD-10-CM

## 2020-08-07 ENCOUNTER — Ambulatory Visit (INDEPENDENT_AMBULATORY_CARE_PROVIDER_SITE_OTHER): Payer: Medicaid Other | Admitting: Urology

## 2020-08-07 ENCOUNTER — Other Ambulatory Visit: Payer: Self-pay

## 2020-08-07 ENCOUNTER — Ambulatory Visit
Admission: RE | Admit: 2020-08-07 | Discharge: 2020-08-07 | Disposition: A | Discharge status: Home / Self Care | Payer: Medicaid Other | Source: Ambulatory Visit | Attending: Urology | Admitting: Urology

## 2020-08-07 ENCOUNTER — Ambulatory Visit
Admission: RE | Admit: 2020-08-07 | Discharge: 2020-08-07 | Disposition: A | Discharge status: Home / Self Care | Payer: Medicaid Other | Attending: Urology | Admitting: Urology

## 2020-08-07 VITALS — BP 112/78 | HR 73 | Ht 67.0 in | Wt 177.0 lb

## 2020-08-07 DIAGNOSIS — N200 Calculus of kidney: Secondary | ICD-10-CM

## 2020-08-07 DIAGNOSIS — R109 Unspecified abdominal pain: Secondary | ICD-10-CM | POA: Diagnosis not present

## 2020-08-07 NOTE — Progress Notes (Signed)
08/07/2020 3:22 PM   Peggye Pitt [facility] May 19, 1991 [id number]  Referring provider: No referring provider defined for this encounter.  Chief Complaint  Patient presents with  . Nephrolithiasis    HPI: 30 year old female with recurrent nephrolithiasis who returns today for routine annual follow-up.  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  This is unchanged from her previous CT scan back in 2020.  She reports today that she has had some intermittent right flank pain for the past several weeks which comes and goes.  Is not triggered by anything in particular without associated alleviating factors.  She denies any gross hematuria or any urinary symptoms.  She wonders if this is from the stone.  She has an extensive stone history as outlined in previous notes.  She has undergone several interventions in the past both including ESWL versus ureteroscopy.  She is tolerated.   PMH: Past Medical History:  Diagnosis Date  . Acne   . Anxiety   . Kidney stone   . Vulvar lesion     Surgical History: Past Surgical History:  Procedure Laterality Date  . CYSTOSCOPY WITH STENT PLACEMENT Right 07/27/2018   Procedure: CYSTOSCOPY WITH STENT PLACEMENT;  Surgeon: Billey Co, MD;  Location: [facility] ORS;  Service: Urology;  Laterality: Right;  . CYSTOSCOPY/URETEROSCOPY/HOLMIUM LASER/STENT PLACEMENT Right 08/03/2018   Procedure: CYSTOSCOPY/URETEROSCOPY/HOLMIUM LASER/STENT EXCHANGE **WITH NST BEFORE AND AFTER;  Surgeon: [name], MD;  Location: [facility] ORS;  Service: Urology;  Laterality: Right;  . EXTRACORPOREAL SHOCK WAVE LITHOTRIPSY Left 02/15/2019   Procedure: EXTRACORPOREAL SHOCK WAVE LITHOTRIPSY (ESWL);  Surgeon: [name], MD;  Location: [facility] ORS;  Service: Urology;  Laterality: Left;  . none      Home Medications:  Allergies as of 08/07/2020   No Known Allergies     Medication List        Accurate as of August 07, 2020 11:59 PM. If you have any questions, ask your nurse or doctor.        STOP taking these medications   doxycycline 100 MG capsule Commonly known as: MONODOX Stopped by: [name], MD     TAKE these medications   Clindamycin-Benzoyl Per (Refr) gel Commonly known as: Duac Apply to face every morning for acne.   Differin 0.3 % gel Generic drug: Adapalene Apply a pea-sized amount to face and shoulders every night as tolerated.   FLUoxetine 20 MG capsule Commonly known as: PROzac Take 1 capsule (20 mg total) by mouth daily.   spironolactone 100 MG tablet Commonly known as: ALDACTONE TAKE 1 TABLET(100 MG) BY MOUTH DAILY       Allergies: No Known Allergies  Family History: Family History  Problem Relation Age of Onset  . Congestive Heart Failure Maternal Grandfather   . Parkinson's disease Paternal Grandmother   . Kidney failure Cousin   . Kidney cancer Neg Hx   . Prostate cancer Neg Hx   . Breast cancer Neg Hx   . Ovarian cancer Neg Hx   . Colon cancer Neg Hx     Social History:  reports that she has never smoked. She has never used smokeless tobacco. She reports current alcohol use. She reports that she does not use drugs.   Physical Exam: BP 112/78   Pulse 73   Ht 5\' 7"  (1.702 m)   Wt 177 lb (80.3 kg)   BMI 27.72 kg/m   Constitutional:  Alert  and oriented, No acute distress. HEENT: [place] AT, moist mucus membranes.  Trachea midline, no masses. Cardiovascular: No clubbing, cyanosis, or edema. Respiratory: Normal respiratory effort, no increased work of breathing. Skin: No rashes, bruises or suspicious lesions. Neurologic: Grossly intact, no focal deficits, moving all 4 extremities. Psychiatric: Normal mood and affect.  Laboratory Data: Lab Results  Component Value Date   WBC 8.1 04/15/2019   HGB 12.1 04/15/2019   HCT 37.6 04/15/2019   MCV 82.1 04/15/2019   PLT 196 04/15/2019    Lab Results  Component Value  Date   CREATININE 0.65 04/15/2019   Pertinent Imaging:  Abdomen 1 view (KUB)  Narrative CLINICAL DATA:  Right flank pain.  History of kidney stones.  EXAM: ABDOMEN - 1 VIEW  COMPARISON:  CT 04/15/2019.  FINDINGS: Stable right nephrolithiasis. No evidence of ureteral stone. IUD noted in the pelvis. Large amount of stool noted throughout the colon. No acute bony abnormality identified.  IMPRESSION: 1.  Stable right nephrolithiasis.  No evidence of ureteral stone.  2.  IUD noted the pelvis.   Electronically Signed By: [place] On: 07/03/2019 15:27  KUB images were personally reviewed today.  This is compared to previous CT scan dating back to 2000 and essentially, this is unchanged on the right side.  Assessment & Plan:    1. Nephrolithiasis Essentially stable stone disease with a nonobstructing right lower pole stone as well as another probable punctate intraparenchymal stone  Given the overall stability of the stone size, as well as lack of change of the course of several years, observation is definitely reasonable.  We discussed various treatment options for urolithiasis including observation with or without medical expulsive therapy, shockwave lithotripsy (SWL), ureteroscopy and laser lithotripsy with stent placement, and percutaneous nephrolithotomy.   We discussed that management is based on stone size, location, density, patient co-morbidities, and patient preference.    Stones <58mm in size have a >80% spontaneous passage rate. Data surrounding the use of tamsulosin for medical expulsive therapy is controversial, but meta analyses suggests it is most efficacious for distal stones between 5-86mm in size. Possible side effects include dizziness/lightheadedness, and retrograde ejaculation.   SWL has a lower stone free rate in a single procedure, but also a lower complication rate compared to ureteroscopy and avoids a stent and associated stent related symptoms.  Possible complications include renal hematoma, steinstrasse, and need for additional treatment. We discussed the role of his increased skin to stone distance can lead to decreased efficacy with shockwave lithotripsy.   Ureteroscopy with laser lithotripsy and stent placement has a higher stone free rate than SWL in a single procedure, however increased complication rate including possible infection, ureteral injury, bleeding, and stent related morbidity. Common stent related symptoms include dysuria, urgency/frequency, and flank pain.   She will let us know if she would like to proceed with either 1.  She is good candidate for each procedure.  She is aware that ESWL is a lower stone free rate  We also reviewed stone diet recommendations again today.  - DG Abd 1 View; Future  2. Right flank pain Given the extreme stability of the stones as well as nonobstructing lower pole nature, is very unlikely that her discomfort is associated with a stone    Follow-up in a year with KUB or sooner as needed [name], MD  [facility] [address] [place], [place] [address] [phone]

## 2020-08-10 ENCOUNTER — Encounter: Payer: Self-pay | Admitting: Urology

## 2020-08-14 ENCOUNTER — Ambulatory Visit
Admission: RE | Admit: 2020-08-14 | Discharge: 2020-08-14 | Disposition: A | Discharge status: Home / Self Care | Payer: Medicaid Other | Source: Ambulatory Visit | Attending: Otolaryngology | Admitting: Otolaryngology

## 2020-08-14 ENCOUNTER — Other Ambulatory Visit: Payer: Self-pay | Admitting: Otolaryngology

## 2020-08-14 ENCOUNTER — Other Ambulatory Visit: Payer: Self-pay

## 2020-08-14 DIAGNOSIS — H93A9 Pulsatile tinnitus, unspecified ear: Secondary | ICD-10-CM | POA: Insufficient documentation

## 2020-08-14 MED ORDER — GADOBUTROL 1 MMOL/ML IV SOLN
8.0000 mL | Freq: Once | INTRAVENOUS | Status: AC | PRN
  Administered 2020-08-14: 7.5 mL via INTRAVENOUS

## 2020-08-26 ENCOUNTER — Ambulatory Visit (INDEPENDENT_AMBULATORY_CARE_PROVIDER_SITE_OTHER): Payer: Medicaid Other | Admitting: Certified Nurse Midwife

## 2020-08-26 ENCOUNTER — Encounter: Payer: Self-pay | Admitting: Certified Nurse Midwife

## 2020-08-26 DIAGNOSIS — F419 Anxiety disorder, unspecified: Secondary | ICD-10-CM | POA: Insufficient documentation

## 2020-08-26 DIAGNOSIS — Z79899 Other long term (current) drug therapy: Secondary | ICD-10-CM | POA: Diagnosis not present

## 2020-08-26 MED ORDER — FLUOXETINE HCL 20 MG PO CAPS: 20.0000 mg | ORAL_CAPSULE | Freq: Every day | ORAL | 5 refills | Status: DC

## 2020-08-26 NOTE — Progress Notes (Signed)
Virtual Visit via Telephone Note  I connected with Evelyn Caldwell on 08/26/20 at 10:30 AM EDT by telephone and verified that I am speaking with the correct person using two identifiers.  Location: Patient:At home Provider: at the office   I discussed the limitations, risks, security and privacy concerns of performing an evaluation and management service by telephone and the availability of in person appointments. I also discussed with the patient that there may be a patient responsible charge related to this service. The patient expressed understanding and agreed to proceed.   History of Present Illness: Televisit today with pt to follow up with medication dose. She was increased from 10 mg to 20 mg daily Prozac 4 wks ago.   Observations/Objective: States she is doing better, says she has more good days than bad and that she is happy on this medication. Notes that it has affected her libido and oragaims some. ( take her longer). PHQ9 SCORE ONLY 08/26/2020 11/29/2018  PHQ-9 Total Score 7 0   GAD 7 : Generalized Anxiety Score 08/26/2020 07/29/2020 07/01/2020 12/15/2018  Nervous, Anxious, on Edge 1 3 3 1   Control/stop worrying 2 1 1 1   Worry too much - different things 2 1 3  0  Trouble relaxing 3 3 3 1   Restless 0 1 1 0  Easily annoyed or irritable 2 3 3 1   Afraid - awful might happen 0 1 1 0  Total GAD 7 Score 10 13 15 4   Anxiety Difficulty Somewhat difficult Not difficult at all Very difficult Somewhat difficult     Assessment and Plan: Discussed continuing on this dose for 6 months then potential if she is doing well decreasing back down to 10 mg to see if that improves libido and orgasm. She verbalizes and agrees to plan.    Follow Up Instructions: 6 months to discuss continuation or decrease in dose or PRN.    I discussed the assessment and treatment plan with the patient. The patient was provided an opportunity to ask questions and all were answered. The patient agreed with  the plan and demonstrated an understanding of the instructions.   The patient was advised to call back or seek an in-person evaluation if the symptoms worsen or if the condition fails to improve as anticipated.  I provided 6 minutes of non-face-to-face time during this encounter.   Philip Aspen, CNM

## 2020-08-26 NOTE — Patient Instructions (Signed)
Managing Anger, Adult Everyone feels angry from time to time. It is okay and normal to feel angry. However, the way that you behave or react to anger can make it a problem. How do problems managing my anger affect me? Reacting too strongly to anger, acting out aggressively, or not expressing it at all can:  Create fear in others.  Cause others to react with anger as well.  Cause others to shut down.  Create legal problems. Stress  Anger can trigger stress-related problems, such as headaches, poor digestion, or trouble sleeping.  Stress can make anger harder to manage.  When anger is out of control it is likely to create more stress. Relationships  Anger can cause emotional relationship problems at home.  Aggressive anger can lead to harming someone else.  Anger at work is often unacceptable and may lead to job problems.  Unpredictable anger can cause problems with friends. Your health  Anger can affect your health. Anger has an impact on: ? Your mental health. ? Blood pressure. ? Heart rate. ? Hormone production.  Anger has been associated with: ? Migraine headaches. ? Cardiovascular disease. ? High blood pressure. ? Chest pain. What actions can I take to manage anger? Anger management is not just a matter of staying silent. It involves self awareness of when you are angry and then developing and practicing skills to express it clearly and appropriately. Express anger in a healthy way Use the following healthy strategies to help you manage your anger:  Step away. When you are feeling reactive, it may take at least 20 minutes for your body to return to its normal blood pressure and heart rate. To help your body do this, take a walk, listen to music, stretch, take deep breaths, and avoid the person or situation that has you feeling angry. Try to discuss your anger when you feel calm again.  Consider how others may feel before you react. Avoid swearing, sighing, raising  your voice, or blaming.  Realize that you can have the feeling of anger, but you do not have to become the feeling. All feelings happen and usually lessen over time.  Choose a good time to work through problems and reach agreement with the other person. You may be more likely to lose your temper at the end of the day when you are tired. The other person may also still be reactive for a while. Set a time and place to come back together that works for both of you.  Keep a journal of your feelings. Write down situations in which you become angry. This helps you know your feelings and may help you figure out what triggers your anger. Change the way you think about the situation Change your perception. Consider if there is another way you can view the situation that will leave you with a different emotion. Sometimes, changing the way you think about a situation can make it seem less infuriating. Here are some ways to do that:  Remind yourself that everyone is not out to get you.  Remind yourself that a disappointing result is not the end of the world. You can cope with being disappointed.  Take steps to solve or prevent the situation that upsets you.  Find the humor in an aggravating situation.  Deal with the physical effects of anger by taking deep breaths, exercising, or taking a walk.  Practice letting go of whatever is making you angry. Realize that many things that may anger you are outside  of your control and you cannot do anything about them.  Picture a relaxing image in your mind. Close your eyes and use that image to help you calm yourself.      How to recognize anger-related problems Anger becomes a problem if it occurs frequently and lasts for long periods of time. You may also need help managing your anger if:  You use physical force or aggression when you are angry and others around you feel threatened and fearful.  You feel that your anger is out of control.  Anger is  interfering with your job.  Anger is causing problems with your health.  Anger is causing problems with your relationships.  Anger is affecting your ability to tolerate normal daily situations, such as sitting in a traffic jam or waiting in line.  You do not trust people around you. Follow these instructions at home:  Stop and think before you react.  Count to 10 before you speak.  Let your loved ones know you are struggling with this and that you are going to seek help.  Talk to people you trust and ask them for help. They can also help you find someone who can coach you to react differently. Where to find support To find support on how to manage your anger:  Call your health care provider for a referral to a mental health professional. Follow up by calling and making an appointment.  Look online to find a psychologist who specializes in anger management.  Look online for a local office of the Domestic Abuse Project. Your local hospital or behavioral counselors in your area may also offer anger management programs or support groups that can help.   Where to find more information  American Psychological Association: TVStereos.ch  Substance Abuse and Mental Health Services Administration: ktimeonline.com  Help Guide: www.helpguide.[place] on Domestic Violence: GreenSwimming.be Contact a health care provider if:  You are unable to control your anger.  You feel depressed.  Your anger interferes with your ability to function at home, at work, at school, or with friends.  You have physical symptoms of anger or stress such as headaches, digestion problems, or trouble sleeping. Get help right away if:  You may be a danger to yourself or others. If you ever feel like you may hurt yourself or others, or have thoughts about taking your own life, get help right away. Go to your nearest emergency department or:  Call your local emergency services (911 in the  [place]  Call a suicide crisis helpline, such as the [place] at 667-229-8181. This is open 24 hours a day in the [place]  Text the Crisis Text Line at 916 022 6577 (in the [place].). Summary  It is normal for everyone to feel angry at times. However, anger becomes a problem if it occurs frequently and lasts for long periods of time.  Health and relationship problems can develop when you react too strongly to anger, act out in anger, or do not express your anger at all.  You can use strategies to help you express your anger in a healthy way.  Learn to change your perception. Sometimes, changing the way you think about a situation can make it seem less infuriating.  Contact your health care provider or a mental health professional if you need help managing your anger. This information is not intended to replace advice given to you by your health care provider. Make sure you discuss any questions you have  with your health care provider. Document Revised: 09/28/2019 Document Reviewed: 09/28/2019 Elsevier Patient Education  [place].

## 2020-09-16 ENCOUNTER — Ambulatory Visit: Payer: Medicaid Other

## 2020-09-27 ENCOUNTER — Other Ambulatory Visit: Payer: Self-pay | Admitting: Certified Nurse Midwife

## 2020-10-13 ENCOUNTER — Encounter: Payer: Self-pay | Admitting: Urology

## 2020-10-14 ENCOUNTER — Other Ambulatory Visit: Payer: Self-pay

## 2020-10-14 DIAGNOSIS — N200 Calculus of kidney: Secondary | ICD-10-CM

## 2020-10-14 NOTE — Progress Notes (Signed)
10/15/2020 5:21 PM   Peggye Pitt [facility] [birth year] [id number]  Referring provider: No referring provider defined for this encounter.  Chief Complaint  Patient presents with  . Nephrolithiasis   Urological history: 1. Nephrolithiasis -stone composition 50% calcium oxalate dihydrate, 30% calcium oxalate monohydrate and 20% hydroxyapatite -spontaneous passage in 2017 and 04/2019 -URS for right ureteral stone 07/2018 -ESWL left ureteral stone 01/2019 -KUB 07/2020 a stable 6 mm right lower pole stone as well as another punctate likely Randall's plaque within the same kidney  HPI: Evelyn Caldwell is a 30 y.o. female who presents today for flank pain and hematuria.  She noticed dark colored urine on Thursday, Friday and Monday associated with right sided pain.  The pain does not radiate.  It was associated with some mild increase in frequency.    Her symptoms have since abated.  Patient denies any modifying or aggravating factors.  Patient denies any gross hematuria, dysuria or suprapubic/flank pain.  Patient denies any fevers, chills, nausea or vomiting.   UA 3-10 RBC's.    KUB demonstrates a stable 6 mm right renal stone and a punctate stone in the right kidney as well and punctate stones in the left kidney.   PMH: Past Medical History:  Diagnosis Date  . Acne   . Anxiety   . Kidney stone   . Vulvar lesion     Surgical History: Past Surgical History:  Procedure Laterality Date  . CYSTOSCOPY WITH STENT PLACEMENT Right 07/27/2018   Procedure: CYSTOSCOPY WITH STENT PLACEMENT;  Surgeon: Billey Co, MD;  Location: [facility] ORS;  Service: Urology;  Laterality: Right;  . CYSTOSCOPY/URETEROSCOPY/HOLMIUM LASER/STENT PLACEMENT Right 08/03/2018   Procedure: CYSTOSCOPY/URETEROSCOPY/HOLMIUM LASER/STENT EXCHANGE **WITH NST BEFORE AND AFTER**;  Surgeon: [name], MD;  Location: [facility] ORS;  Service: Urology;  Laterality: Right;  . EXTRACORPOREAL SHOCK WAVE  LITHOTRIPSY Left 02/15/2019   Procedure: EXTRACORPOREAL SHOCK WAVE LITHOTRIPSY (ESWL);  Surgeon: [name], MD;  Location: [facility] ORS;  Service: Urology;  Laterality: Left;  . none      Home Medications:  Allergies as of 10/15/2020   No Known Allergies     Medication List       Accurate as of Oct 15, 2020 11:59 PM. If you have any questions, ask your nurse or doctor.        Clindamycin-Benzoyl Per (Refr) gel Commonly known as: Duac Apply to face every morning for acne.   Differin 0.3 % gel Generic drug: Adapalene Apply a pea-sized amount to face and shoulders every night as tolerated.   FLUoxetine 20 MG capsule Commonly known as: PROZAC TAKE 1 CAPSULE(20 MG) BY MOUTH DAILY   spironolactone 100 MG tablet Commonly known as: ALDACTONE TAKE 1 TABLET(100 MG) BY MOUTH DAILY       Allergies: No Known Allergies  Family History: Family History  Problem Relation Age of Onset  . Congestive Heart Failure Maternal Grandfather   . Parkinson's disease Paternal Grandmother   . Kidney failure Cousin   . Kidney cancer Neg Hx   . Prostate cancer Neg Hx   . Breast cancer Neg Hx   . Ovarian cancer Neg Hx   . Colon cancer Neg Hx     Social History:  reports that she has never smoked. She has never used smokeless tobacco. She reports current alcohol use. She reports that she does not use drugs.  ROS: Pertinent ROS in HPI  Physical Exam: BP 105/67   Pulse 74   Ht 5\' 7"  (1.702  m)   Wt 182 lb (82.6 kg)   BMI 28.51 kg/m   Constitutional:  Well nourished. Alert and oriented, No acute distress. HEENT: [place] AT, mask in place.  Trachea midline Cardiovascular: No clubbing, cyanosis, or edema. Respiratory: Normal respiratory effort, no increased work of breathing. Neurologic: Grossly intact, no focal deficits, moving all 4 extremities. Psychiatric: Normal mood and affect.   Laboratory Data: Urinalysis Component     Latest Ref Rng & Units 10/15/2020  Specific Gravity, UA      1.005 - 1.030 1.025  pH, UA     5.0 - 7.5 6.0  Color, UA     Yellow Yellow  Appearance Ur     Clear Cloudy (A)  Leukocytes,UA     Negative Negative  Protein,UA     Negative/Trace Negative  Glucose, UA     Negative Negative  Ketones, UA     Negative Negative  RBC, UA     Negative 2+ (A)  Bilirubin, UA     Negative Negative  Urobilinogen, Ur     0.2 - 1.0 mg/dL 0.2  Nitrite, UA     Negative Negative  Microscopic Examination      See below:   Component     Latest Ref Rng & Units 10/15/2020  WBC, UA     0 - 5 /hpf 0-5  RBC     0 - 2 /hpf 3-10 (A)  Epithelial Cells (non renal)     0 - 10 /hpf 0-10  Mucus, UA     Not Estab. Present (A)  Bacteria, UA     None seen/Few Few (A)  I have reviewed the labs.   Pertinent Imaging: CLINICAL DATA:  Nephrolithiasis  EXAM: ABDOMEN - 1 VIEW  COMPARISON:  August 07, 2020  FINDINGS: There is a stable calculus in the lower right kidney measuring 7 x 7 mm. Slightly more superiorly, there is a stable 2 mm calculus in the mid right kidney. No new calcifications are evident.  There is an intrauterine device in the mid-pelvis. There is moderate stool in the colon. There is no bowel dilatation or air-fluid level to suggest bowel obstruction. No free air. Lung bases clear.  IMPRESSION: Stable right renal calculi.  No new abnormal calcifications.  No bowel obstruction or free air. Intrauterine device in mid pelvis.   Electronically Signed   By: Lowella Grip III M.D.   On: 10/17/2020 10:14 I have independently reviewed the films.  See HPI  Assessment & Plan:    1. Flank pain -likely due to a spontaneously passed fragments as symptoms started quickly and abated quickly -she will return if symptoms return   2. Nephrolithiasis -stones appear stable on today's KUB -return as scheduled for yearly follow up in 07/2021  3. Gross hematuria  -she has had several upper tract imaging over the last two years as well as  cystoscopy  -we discussed pursuing a CT renal stone study, but she deferred at this time due her symptoms abating    Return if symptoms worsen or fail to improve.  These notes generated with voice recognition software. I apologize for typographical errors.  Zara Council, PA-C  [facility] [address]  [place], [place] [address] [phone]

## 2020-10-14 NOTE — Telephone Encounter (Signed)
Called patient and she would like to schedule an appt w/PA for further evaluation. Apt made for tomorrow with KUB prior

## 2020-10-15 ENCOUNTER — Encounter: Payer: Self-pay | Admitting: Urology

## 2020-10-15 ENCOUNTER — Ambulatory Visit (INDEPENDENT_AMBULATORY_CARE_PROVIDER_SITE_OTHER): Payer: Medicaid Other | Admitting: Urology

## 2020-10-15 ENCOUNTER — Ambulatory Visit
Admission: RE | Admit: 2020-10-15 | Discharge: 2020-10-15 | Disposition: A | Discharge status: Home / Self Care | Payer: Medicaid Other | Attending: Urology | Admitting: Urology

## 2020-10-15 ENCOUNTER — Other Ambulatory Visit: Payer: Self-pay

## 2020-10-15 ENCOUNTER — Ambulatory Visit
Admission: RE | Admit: 2020-10-15 | Discharge: 2020-10-15 | Disposition: A | Discharge status: Home / Self Care | Payer: Medicaid Other | Source: Ambulatory Visit | Attending: Urology | Admitting: Urology

## 2020-10-15 VITALS — BP 105/67 | HR 74 | Ht 67.0 in | Wt 182.0 lb

## 2020-10-15 DIAGNOSIS — R109 Unspecified abdominal pain: Secondary | ICD-10-CM

## 2020-10-15 DIAGNOSIS — N200 Calculus of kidney: Secondary | ICD-10-CM | POA: Diagnosis not present

## 2020-10-15 DIAGNOSIS — R310 Gross hematuria: Secondary | ICD-10-CM | POA: Diagnosis not present

## 2020-10-17 LAB — URINALYSIS, COMPLETE
Bilirubin, UA: NEGATIVE
Glucose, UA: NEGATIVE
Ketones, UA: NEGATIVE
Leukocytes,UA: NEGATIVE
Nitrite, UA: NEGATIVE
Protein,UA: NEGATIVE
Specific Gravity, UA: 1.025 (ref 1.005–1.030)
Urobilinogen, Ur: 0.2 mg/dL (ref 0.2–1.0)
pH, UA: 6 (ref 5.0–7.5)

## 2020-10-17 LAB — MICROSCOPIC EXAMINATION

## 2020-10-18 LAB — CULTURE, URINE COMPREHENSIVE

## 2020-10-27 ENCOUNTER — Encounter: Payer: Self-pay | Admitting: Urology

## 2021-01-14 ENCOUNTER — Ambulatory Visit: Payer: Medicaid Other

## 2021-01-20 ENCOUNTER — Ambulatory Visit: Payer: Medicaid Other | Admitting: Dermatology

## 2021-02-03 ENCOUNTER — Ambulatory Visit
Admission: RE | Admit: 2021-02-03 | Discharge: 2021-02-03 | Disposition: A | Discharge status: Home / Self Care | Payer: Medicaid Other | Source: Ambulatory Visit | Attending: Otolaryngology | Admitting: Otolaryngology

## 2021-02-03 ENCOUNTER — Other Ambulatory Visit: Payer: Self-pay

## 2021-02-03 ENCOUNTER — Ambulatory Visit: Payer: Medicaid Other | Admitting: Dermatology

## 2021-02-03 DIAGNOSIS — Z1283 Encounter for screening for malignant neoplasm of skin: Secondary | ICD-10-CM

## 2021-02-03 DIAGNOSIS — L814 Other melanin hyperpigmentation: Secondary | ICD-10-CM

## 2021-02-03 DIAGNOSIS — D229 Melanocytic nevi, unspecified: Secondary | ICD-10-CM

## 2021-02-03 DIAGNOSIS — H93A9 Pulsatile tinnitus, unspecified ear: Secondary | ICD-10-CM | POA: Insufficient documentation

## 2021-02-03 DIAGNOSIS — D2271 Melanocytic nevi of right lower limb, including hip: Secondary | ICD-10-CM | POA: Diagnosis not present

## 2021-02-03 DIAGNOSIS — L700 Acne vulgaris: Secondary | ICD-10-CM | POA: Diagnosis not present

## 2021-02-03 DIAGNOSIS — L578 Other skin changes due to chronic exposure to nonionizing radiation: Secondary | ICD-10-CM | POA: Diagnosis not present

## 2021-02-03 MED ORDER — CLINDAMYCIN PHOS-BENZOYL PEROX 1.2-5 % EX GEL: CUTANEOUS | 6 refills | Status: DC

## 2021-02-03 MED ORDER — SPIRONOLACTONE 100 MG PO TABS: ORAL_TABLET | ORAL | 11 refills | Status: DC

## 2021-02-03 MED ORDER — DIFFERIN 0.3 % EX GEL: CUTANEOUS | 6 refills | Status: DC

## 2021-02-03 NOTE — Progress Notes (Signed)
Follow-Up Visit   Subjective  Evelyn Caldwell is a 30 y.o. female who presents for the following: Acne (Face. Patient is taking Spironolactone '100mg'$  daily (no side effects), using Clindamycin-benzoyl peroxide gel in the morning, and Differin at night. ) and Tender spot (Bil ears, off and on for a couple months. Better today, but flares up and gets a tender, raised bump.). Patient requests skin check today. Tanning bed use in the past.    The following portions of the chart were reviewed this encounter and updated as appropriate:       Review of Systems:  No other skin or systemic complaints except as noted in HPI or Assessment and Plan.  Objective  Well appearing patient in no apparent distress; mood and affect are within normal limits.  A focused examination was performed including face, trunk, extremities. Relevant physical exam findings are noted in the Assessment and Plan.  face Few closed comedones on cheeks and chin; closed comedone on left ear canal; resolving inflammatory papule on the right upper back.  Right pretibia 6.0 x 4.46m medium light brown macule   Assessment & Plan  Skin cancer screening performed today.  Actinic Damage - chronic, secondary to cumulative UV radiation exposure/sun exposure over time - diffuse scaly erythematous macules with underlying dyspigmentation - Recommend daily broad spectrum sunscreen SPF 30+ to sun-exposed areas, reapply every 2 hours as needed.  - Recommend staying in the shade or wearing long sleeves, sun glasses (UVA+UVB protection) and wide brim hats (4-inch brim around the entire circumference of the hat). - Call for new or changing lesions.  Melanocytic Nevi - Tan-brown and/or pink-flesh-colored symmetric macules and papules - Benign appearing on exam today - Observation - Call clinic for new or changing moles - Recommend daily use of broad spectrum spf 30+ sunscreen to sun-exposed areas.   Lentigines - Scattered  tan macules - Due to sun exposure - Benign-appering, observe - Recommend daily broad spectrum sunscreen SPF 30+ to sun-exposed areas, reapply every 2 hours as needed. - Call for any changes  Acne vulgaris face  Improved   BP 112/71 Continue Spironolactone '100mg'$  take 1 po QD dsp #30 5Rf. Continue Clindamycin-Benzoyl Peroxide Gel QAM 3Rf. Increase Differin 0.3% Gel QHS as tolerated 3Rf.  Spironolactone can cause increased urination and cause blood pressure to decrease. Please watch for signs of lightheadedness and be cautious when changing position. It can sometimes cause breast tenderness or an irregular period in premenopausal women. It can also increase potassium. The increase in potassium usually is not a concern unless you are taking other medicines that also increase potassium, so please be sure your doctor knows all of the other medications you are taking. This medication should not be taken by pregnant women.  This medicine should also not be taken together with sulfa drugs like Bactrim (trimethoprim/sulfamethexazole).   Benzoyl peroxide can cause dryness and irritation of the skin. It can also bleach fabric. When used together with Aczone (dapsone) cream, it can stain the skin orange.  Topical retinoid medications like tretinoin/Retin-A, adapalene/Differin, tazarotene/Fabior, and Epiduo/Epiduo Forte can cause dryness and irritation when first started. Only apply a pea-sized amount to the entire affected area. Avoid applying it around the eyes, edges of mouth and creases at the nose. If you experience irritation, use a good moisturizer first and/or apply the medicine less often. If you are doing well with the medicine, you can increase how often you use it until you are applying every night. Be careful with  sun protection while using this medication as it can make you sensitive to the sun. This medicine should not be used by pregnant women.       Related  Medications Clindamycin-Benzoyl Per, Refr, (DUAC) gel Apply to face every morning for acne.  DIFFERIN 0.3 % gel Apply a pea-sized amount to face and shoulders every night as tolerated.  spironolactone (ALDACTONE) 100 MG tablet TAKE 1 TABLET(100 MG) BY MOUTH DAILY  Nevus Right pretibia  Benign-appearing.  Observation.  Call clinic for new or changing moles.  Recommend daily use of broad spectrum spf 30+ sunscreen to sun-exposed areas.   Return in about 1 year (around 02/03/2022) for TBSE and acne f/u.  IJamesetta Orleans, CMA, am acting as scribe for Brendolyn Patty, MD .  Documentation: I have reviewed the above documentation for accuracy and completeness, and I agree with the above.  Brendolyn Patty MD

## 2021-02-03 NOTE — Patient Instructions (Addendum)
Melanoma ABCDEs  Melanoma is the most dangerous type of skin cancer, and is the leading cause of death from skin disease.  You are more likely to develop melanoma if you: Have light-colored skin, light-colored eyes, or red or blond hair Spend a lot of time in the sun Tan regularly, either outdoors or in a tanning bed Have had blistering sunburns, especially during childhood Have a close family member who has had a melanoma Have atypical moles or large birthmarks  Early detection of melanoma is key since treatment is typically straightforward and cure rates are extremely high if we catch it early.   The first sign of melanoma is often a change in a mole or a new dark spot.  The ABCDE system is a way of remembering the signs of melanoma.  A for asymmetry:  The two halves do not match. B for border:  The edges of the growth are irregular. C for color:  A mixture of colors are present instead of an even brown color. D for diameter:  Melanomas are usually (but not always) greater than 21m - the size of a pencil eraser. E for evolution:  The spot keeps changing in size, shape, and color.  Please check your skin once per month between visits. You can use a small mirror in front and a large mirror behind you to keep an eye on the back side or your body.   If you see any new or changing lesions before your next follow-up, please call to schedule a visit.  Please continue daily skin protection including broad spectrum sunscreen SPF 30+ to sun-exposed areas, reapplying every 2 hours as needed when you're outdoors.   Staying in the shade or wearing long sleeves, sun glasses (UVA+UVB protection) and wide brim hats (4-inch brim around the entire circumference of the hat) are also recommended for sun protection.    Spironolactone can cause increased urination and cause blood pressure to decrease. Please watch for signs of lightheadedness and be cautious when changing position. It can sometimes cause  breast tenderness or an irregular period in premenopausal women. It can also increase potassium. The increase in potassium usually is not a concern unless you are taking other medicines that also increase potassium, so please be sure your doctor knows all of the other medications you are taking. This medication should not be taken by pregnant women.  This medicine should also not be taken together with sulfa drugs like Bactrim (trimethoprim/sulfamethexazole).   Topical retinoid medications like tretinoin/Retin-A, adapalene/Differin, tazarotene/Fabior, and Epiduo/Epiduo Forte can cause dryness and irritation when first started. Only apply a pea-sized amount to the entire affected area. Avoid applying it around the eyes, edges of mouth and creases at the nose. If you experience irritation, use a good moisturizer first and/or apply the medicine less often. If you are doing well with the medicine, you can increase how often you use it until you are applying every night. Be careful with sun protection while using this medication as it can make you sensitive to the sun. This medicine should not be used by pregnant women.   Benzoyl peroxide can cause dryness and irritation of the skin. It can also bleach fabric. When used together with Aczone (dapsone) cream, it can stain the skin orange.   If you have any questions or concerns for your doctor, please call our main line at [phone] press option 4 to reach your doctor's medical assistant. If no one answers, please leave a voicemail as directed  and we will return your call as soon as possible. Messages left after 4 pm will be answered the following business day.   You may also send [place] a message via [place]. We typically respond to MyChart messages within 1-2 business days.  For prescription refills, please ask your pharmacy to contact our office. Our fax number is [phone].  If you have an urgent issue when the clinic is closed that cannot wait until  the next business day, you can page your doctor at the number below.    Please note that while we do our best to be available for urgent issues outside of office hours, we are not available 24/7.   If you have an urgent issue and are unable to reach [place], you may choose to seek medical care at your doctor's office, retail clinic, urgent care center, or emergency room.  If you have a medical emergency, please immediately call 911 or go to the emergency department.  Pager Numbers  - Dr. Nehemiah Massed: [phone]  - Dr. Laurence Ferrari: [phone]  - Dr. Nicole Kindred: [phone]  In the event of inclement weather, please call our main line at [phone] for an update on the status of any delays or closures.  Dermatology Medication Tips: Please keep the boxes that topical medications come in in order to help keep track of the instructions about where and how to use these. Pharmacies typically print the medication instructions only on the boxes and not directly on the medication tubes.   If your medication is too expensive, please contact our office at [phone] option 4 or send [place] a message through [place].   We are unable to tell what your co-pay for medications will be in advance as this is different depending on your insurance coverage. However, we may be able to find a substitute medication at lower cost or fill out paperwork to get insurance to cover a needed medication.   If a prior authorization is required to get your medication covered by your insurance company, please allow [place] 1-2 business days to complete this process.  Drug prices often vary depending on where the prescription is filled and some pharmacies may offer cheaper prices.  The website www.goodrx.com contains coupons for medications through different pharmacies. The prices here do not account for what the cost may be with help from insurance (it may be cheaper with your insurance), but the website can give you the price if you did  not use any insurance.  - You can print the associated coupon and take it with your prescription to the pharmacy.  - You may also stop by our office during regular business hours and pick up a GoodRx coupon card.  - If you need your prescription sent electronically to a different pharmacy, notify our office through [facility] or by phone at [phone] option 4.

## 2021-02-08 ENCOUNTER — Encounter: Payer: Self-pay | Admitting: Urology

## 2021-02-09 NOTE — Telephone Encounter (Signed)
I spoke with patient regarding my chart message. Patient currently denies any urinary symptoms or pain at this time. She did decline an appointment to be evaluated. Pt states she will call us if symptoms return. Pt is scheduled for UA drop off next week.

## 2021-02-11 ENCOUNTER — Telehealth: Payer: Self-pay

## 2021-02-11 NOTE — Telephone Encounter (Signed)
Advised patient she would need a KUB prior to tomorrows appt and that Sam would like to defer 24 hr urine collection right now as it should not be done within 30 days of a stone episode. Patient verbalized understanding.

## 2021-02-12 ENCOUNTER — Ambulatory Visit (INDEPENDENT_AMBULATORY_CARE_PROVIDER_SITE_OTHER): Payer: Medicaid Other | Admitting: Physician Assistant

## 2021-02-12 ENCOUNTER — Encounter: Payer: Self-pay | Admitting: Physician Assistant

## 2021-02-12 ENCOUNTER — Other Ambulatory Visit: Payer: Self-pay | Admitting: Physician Assistant

## 2021-02-12 ENCOUNTER — Ambulatory Visit
Admission: RE | Admit: 2021-02-12 | Discharge: 2021-02-12 | Disposition: A | Discharge status: Home / Self Care | Payer: Medicaid Other | Attending: Urology | Admitting: Urology

## 2021-02-12 ENCOUNTER — Ambulatory Visit
Admission: RE | Admit: 2021-02-12 | Discharge: 2021-02-12 | Disposition: A | Discharge status: Home / Self Care | Payer: Medicaid Other | Source: Ambulatory Visit | Attending: Urology | Admitting: Urology

## 2021-02-12 ENCOUNTER — Other Ambulatory Visit: Payer: Self-pay

## 2021-02-12 VITALS — BP 112/77 | HR 73 | Ht 67.0 in | Wt 192.0 lb

## 2021-02-12 DIAGNOSIS — N200 Calculus of kidney: Secondary | ICD-10-CM | POA: Diagnosis present

## 2021-02-12 DIAGNOSIS — R109 Unspecified abdominal pain: Secondary | ICD-10-CM

## 2021-02-12 DIAGNOSIS — Z87442 Personal history of urinary calculi: Secondary | ICD-10-CM

## 2021-02-12 NOTE — Progress Notes (Signed)
02/12/2021 11:25 AM   Peggye Pitt [facility] 1990-12-04 [id number]  CC: Chief Complaint  Patient presents with   Nephrolithiasis    HPI: Evelyn Caldwell is a 30 y.o. female with PMH nephrolithiasis who presents today for evaluation of possible acute stone episode.   Today she reports an episode of mild right flank pain occurring 4 days ago with intermittent gross hematuria since.  She denies fever, chills, nausea, vomiting, and dysuria.  She reports a similar history of intermittent right flank pain with or without gross hematuria over the past several months.  She previously discussed pursuing management of her known right renal stones with Dr. Erlene Quan, but defer this.  At this time, she is interested in pursuing stone management.  KUB today with stable appearing nonobstructive right nephrolithiasis.  In-office UA today positive for 2+ blood and trace leukocyte esterase; urine microscopy with 6-10 WBCs/HPF, 3-10 RBCs/HPF, and moderate bacteria.   PMH: Past Medical History:  Diagnosis Date   Acne    Anxiety    Kidney stone    Vulvar lesion     Surgical History: Past Surgical History:  Procedure Laterality Date   CYSTOSCOPY WITH STENT PLACEMENT Right 07/27/2018   Procedure: CYSTOSCOPY WITH STENT PLACEMENT;  Surgeon: Billey Co, MD;  Location: [facility] OR[facility];  Service: Urology;  Laterality: Right;   CYSTOSCOPY/URETEROSCOPY/HOLMIUM LASER/STENT PLACEMENT Right 08/03/2018   Procedure: CYSTOSCOPY/URETEROSCOPY/HOLMIUM LASER/STENT EXCHANGE **WITH NST BEFORE AND AFTER**;  Surgeon: [name], MD;  Location: [facility] ORS;  Service: Urology;  Laterality: Right;   EXTRACORPOREAL SHOCK WAVE LITHOTRIPSY Left 02/15/2019   Procedure: EXTRACORPOREAL SHOCK WAVE LITHOTRIPSY (ESWL);  Surgeon: [name], MD;  Location: [facility] ORS;  Service: Urology;  Laterality: Left;   none      Home Medications:  Allergies as of 02/12/2021   No Known Allergies      Medication List         Accurate as of February 12, 2021 11:25 AM. If you have any questions, ask your nurse or doctor.          Clindamycin-Benzoyl Per (Refr) gel Commonly known as: Duac Apply to face every morning for acne.   Differin 0.3 % gel Generic drug: Adapalene Apply a pea-sized amount to face and shoulders every night as tolerated.   FLUoxetine 20 MG capsule Commonly known as: PROZAC TAKE 1 CAPSULE(20 MG) BY MOUTH DAILY   spironolactone 100 MG tablet Commonly known as: ALDACTONE TAKE 1 TABLET(100 MG) BY MOUTH DAILY        Allergies:  No Known Allergies  Family History: Family History  Problem Relation Age of Onset   Congestive Heart Failure Maternal Grandfather    Parkinson's disease Paternal Grandmother    Kidney failure Cousin    Kidney cancer Neg Hx    Prostate cancer Neg Hx    Breast cancer Neg Hx    Ovarian cancer Neg Hx    Colon cancer Neg Hx     Social History:   reports that she has never smoked. She has never used smokeless tobacco. She reports current alcohol use. She reports that she does not use drugs.  Physical Exam: BP 112/77   Pulse 73   Ht '5\' 7"'$  (1.702 m)   Wt 192 lb (87.1 kg)   BMI 30.07 kg/m   Constitutional:  Alert and oriented, no acute distress, nontoxic appearing HEENT: [place], AT Cardiovascular: No clubbing, cyanosis, or edema Respiratory: Normal respiratory effort, no increased work of breathing Skin: No rashes, bruises or suspicious lesions  Neurologic: Grossly intact, no focal deficits, moving all 4 extremities Psychiatric: Normal mood and affect  Laboratory Data: Results for orders placed or performed in visit on 02/12/21  Microscopic Examination   Urine  Result Value Ref Range   WBC, UA 6-10 (A) 0 - 5 /hpf   RBC 3-10 (A) 0 - 2 /hpf   Epithelial Cells (non renal) 0-10 0 - 10 /hpf   Mucus, UA Present (A) Not Estab.   Bacteria, UA Moderate (A) None seen/Few  Urinalysis, Complete  Result Value Ref Range   Specific Gravity, UA 1.025 1.005 -  1.030   pH, UA 5.5 5.0 - 7.5   Color, UA Yellow Yellow   Appearance Ur Hazy (A) Clear   Leukocytes,UA Trace (A) Negative   Protein,UA Negative Negative/Trace   Glucose, UA Negative Negative   Ketones, UA Negative Negative   RBC, UA 2+ (A) Negative   Bilirubin, UA Negative Negative   Urobilinogen, Ur 0.2 0.2 - 1.0 mg/dL   Nitrite, UA Negative Negative   Microscopic Examination See below:    Pertinent Imaging: KUB, 02/12/2021: CLINICAL DATA:  Kidney stones   EXAM: ABDOMEN - 1 VIEW   COMPARISON:  KUB 10/15/2020   FINDINGS: Two right renal stones are seen measuring approximately 2 mm in the upper pole and 8 mm in the interpolar region, similar in size compared to the study from 10/15/2020. No definite left renal stones are seen.   There is a nonobstructive bowel gas pattern. An IUD is seen in the pelvis. The bones are unremarkable.   IMPRESSION: Two unchanged right renal stones measuring up to 8 mm.     Electronically Signed   By: Valetta Mole M.D.   On: 02/13/2021 10:47  I personally reviewed the images referenced above and note stable nonobstructing appearing right renal calculi with a 7 mm inferior stone and a 2 mm superior stone.  Assessment & Plan:   1. Flank pain with history of urolithiasis Intermittent mild to moderate flank pain, most recently associated with gross hematuria.  She has not clinically infected today and VSS.  UA is notable for mild pyuria, microscopic hematuria, and moderate bacteriuria.  Will send for culture and treat as indicated.  I think her symptoms may possibly be due to ball valving right renal stone.  We discussed management options at this point, and she wishes to proceed.  We discussed options including ESWL versus ureteroscopy.  I advised her that ESWL is less invasive, but we could only treat 1 stone at a time.  Alternatively, ureteroscopy is more invasive and requires placement of a ureteral stent, however we could potentially clear more  than 1 stone in 1 procedure.  She prefers to proceed with the latter given her extensive stone history, which is reasonable.  Booking sheet completed today. - Urinalysis, Complete - CULTURE, URINE COMPREHENSIVE  Return for Will call to schedule surgery, with urine culture results.  [name], PA-C  [facility] [address] [place], [place] [address] [phone]

## 2021-02-12 NOTE — H&P (View-Only) (Signed)
02/12/2021 11:25 AM   Peggye Pitt [facility] [birth year] [id number]  CC: Chief Complaint  Patient presents with   Nephrolithiasis    HPI: Evelyn Caldwell is a 30 y.o. female with PMH nephrolithiasis who presents today for evaluation of possible acute stone episode.   Today she reports an episode of mild right flank pain occurring 4 days ago with intermittent gross hematuria since.  She denies fever, chills, nausea, vomiting, and dysuria.  She reports a similar history of intermittent right flank pain with or without gross hematuria over the past several months.  She previously discussed pursuing management of her known right renal stones with Dr. Erlene Quan, but defer this.  At this time, she is interested in pursuing stone management.  KUB today with stable appearing nonobstructive right nephrolithiasis.  In-office UA today positive for 2+ blood and trace leukocyte esterase; urine microscopy with 6-10 WBCs/HPF, 3-10 RBCs/HPF, and moderate bacteria.   PMH: Past Medical History:  Diagnosis Date   Acne    Anxiety    Kidney stone    Vulvar lesion     Surgical History: Past Surgical History:  Procedure Laterality Date   CYSTOSCOPY WITH STENT PLACEMENT Right 07/27/2018   Procedure: CYSTOSCOPY WITH STENT PLACEMENT;  Surgeon: Billey Co, MD;  Location: [facility] ORS;  Service: Urology;  Laterality: Right;   CYSTOSCOPY/URETEROSCOPY/HOLMIUM LASER/STENT PLACEMENT Right 08/03/2018   Procedure: CYSTOSCOPY/URETEROSCOPY/HOLMIUM LASER/STENT EXCHANGE **WITH NST BEFORE AND AFTER**;  Surgeon: [name], MD;  Location: [facility] OR[facility];  Service: Urology;  Laterality: Right;   EXTRACORPOREAL SHOCK WAVE LITHOTRIPSY Left 02/15/2019   Procedure: EXTRACORPOREAL SHOCK WAVE LITHOTRIPSY (ESWL);  Surgeon: [name], MD;  Location: [facility] ORS;  Service: Urology;  Laterality: Left;   none      Home Medications:  Allergies as of 02/12/2021   No Known Allergies      Medication List         Accurate as of February 12, 2021 11:25 AM. If you have any questions, ask your nurse or doctor.          Clindamycin-Benzoyl Per (Refr) gel Commonly known as: Duac Apply to face every morning for acne.   Differin 0.3 % gel Generic drug: Adapalene Apply a pea-sized amount to face and shoulders every night as tolerated.   FLUoxetine 20 MG capsule Commonly known as: PROZAC TAKE 1 CAPSULE(20 MG) BY MOUTH DAILY   spironolactone 100 MG tablet Commonly known as: ALDACTONE TAKE 1 TABLET(100 MG) BY MOUTH DAILY        Allergies:  No Known Allergies  Family History: Family History  Problem Relation Age of Onset   Congestive Heart Failure Maternal Grandfather    Parkinson's disease Paternal Grandmother    Kidney failure Cousin    Kidney cancer Neg Hx    Prostate cancer Neg Hx    Breast cancer Neg Hx    Ovarian cancer Neg Hx    Colon cancer Neg Hx     Social History:   reports that she has never smoked. She has never used smokeless tobacco. She reports current alcohol use. She reports that she does not use drugs.  Physical Exam: BP 112/77   Pulse 73   Ht '5\' 7"'$  (1.702 m)   Wt 192 lb (87.1 kg)   BMI 30.07 kg/m   Constitutional:  Alert and oriented, no acute distress, nontoxic appearing HEENT: [place], AT Cardiovascular: No clubbing, cyanosis, or edema Respiratory: Normal respiratory effort, no increased work of breathing Skin: No rashes, bruises or suspicious lesions  Neurologic: Grossly intact, no focal deficits, moving all 4 extremities Psychiatric: Normal mood and affect  Laboratory Data: Results for orders placed or performed in visit on 02/12/21  Microscopic Examination   Urine  Result Value Ref Range   WBC, UA 6-10 (A) 0 - 5 /hpf   RBC 3-10 (A) 0 - 2 /hpf   Epithelial Cells (non renal) 0-10 0 - 10 /hpf   Mucus, UA Present (A) Not Estab.   Bacteria, UA Moderate (A) None seen/Few  Urinalysis, Complete  Result Value Ref Range   Specific Gravity, UA 1.025 1.005 -  1.030   pH, UA 5.5 5.0 - 7.5   Color, UA Yellow Yellow   Appearance Ur Hazy (A) Clear   Leukocytes,UA Trace (A) Negative   Protein,UA Negative Negative/Trace   Glucose, UA Negative Negative   Ketones, UA Negative Negative   RBC, UA 2+ (A) Negative   Bilirubin, UA Negative Negative   Urobilinogen, Ur 0.2 0.2 - 1.0 mg/dL   Nitrite, UA Negative Negative   Microscopic Examination See below:    Pertinent Imaging: KUB, 02/12/2021: CLINICAL DATA:  Kidney stones   EXAM: ABDOMEN - 1 VIEW   COMPARISON:  KUB 10/15/2020   FINDINGS: Two right renal stones are seen measuring approximately 2 mm in the upper pole and 8 mm in the interpolar region, similar in size compared to the study from 10/15/2020. No definite left renal stones are seen.   There is a nonobstructive bowel gas pattern. An IUD is seen in the pelvis. The bones are unremarkable.   IMPRESSION: Two unchanged right renal stones measuring up to 8 mm.     Electronically Signed   By: Valetta Mole M.D.   On: 02/13/2021 10:47  I personally reviewed the images referenced above and note stable nonobstructing appearing right renal calculi with a 7 mm inferior stone and a 2 mm superior stone.  Assessment & Plan:   1. Flank pain with history of urolithiasis Intermittent mild to moderate flank pain, most recently associated with gross hematuria.  She has not clinically infected today and VSS.  UA is notable for mild pyuria, microscopic hematuria, and moderate bacteriuria.  Will send for culture and treat as indicated.  I think her symptoms may possibly be due to ball valving right renal stone.  We discussed management options at this point, and she wishes to proceed.  We discussed options including ESWL versus ureteroscopy.  I advised her that ESWL is less invasive, but we could only treat 1 stone at a time.  Alternatively, ureteroscopy is more invasive and requires placement of a ureteral stent, however we could potentially clear more  than 1 stone in 1 procedure.  She prefers to proceed with the latter given her extensive stone history, which is reasonable.  Booking sheet completed today. - Urinalysis, Complete - CULTURE, URINE COMPREHENSIVE  Return for Will call to schedule surgery, with urine culture results.  [name], PA-C  [facility] [address] [place], [place] [address] [phone]

## 2021-02-12 NOTE — Progress Notes (Unsigned)
Surgical Physician Order Form  ** Scheduling expectation : Next Available with Dr. Erlene Quan  *Length of Case:   *Clearance needed: no  *Anticoagulation Instructions: N/A  *Aspirin Instructions: N/A  *Post-op visit Date/Instructions:  4 weeks with KUB prior and UA  *Diagnosis: Right Nephrolithiasis  *Procedure: Ureteroscopy w/laser lithotripsy & stent placement/exchange [id number])  -Admit type: OUTpatient  -Anesthesia: General  -VTE Prophylaxis Standing Order SCD's       Other:   -Standing Lab Orders Per Anesthesia    Lab other: UA, Ucx, urine pregnancy  -Standing Test orders EKG/Chest x-ray per Anesthesia       Test other:   - Medications:     Ancef 1gm IV   Other Instructions:

## 2021-02-13 LAB — URINALYSIS, COMPLETE
Bilirubin, UA: NEGATIVE
Glucose, UA: NEGATIVE
Ketones, UA: NEGATIVE
Nitrite, UA: NEGATIVE
Protein,UA: NEGATIVE
Specific Gravity, UA: 1.025 (ref 1.005–1.030)
Urobilinogen, Ur: 0.2 mg/dL (ref 0.2–1.0)
pH, UA: 5.5 (ref 5.0–7.5)

## 2021-02-13 LAB — MICROSCOPIC EXAMINATION

## 2021-02-16 ENCOUNTER — Telehealth: Payer: Self-pay | Admitting: Physician Assistant

## 2021-02-16 NOTE — Telephone Encounter (Signed)
Per  [name] Patient is to be scheduled for Right Ureteroscopy, Laser Lithotripsy,Ureteral Stent Exchange.  Evelyn Caldwell was contacted and possible surgical dates were discussed, 03/02/21 was agreed upon for surgery.    Patient was directed to call [phone] between 1-3pm the day before surgery to find out surgical arrival time.  Instructions were given not to eat or drink from midnight on the night before surgery and have a driver for the day of surgery. On the surgery day patient was instructed to enter through the [place] entrance of [facility] report the Same Day Surgery desk.   Pre-Admit Testing will be in contact via phone to set up an interview with the anesthesia team to review your history and medications prior to surgery.   Reminder of this information was sent via mychart to the patient.

## 2021-02-17 ENCOUNTER — Other Ambulatory Visit: Payer: Medicaid Other

## 2021-02-17 ENCOUNTER — Other Ambulatory Visit: Payer: Self-pay

## 2021-02-17 DIAGNOSIS — N200 Calculus of kidney: Secondary | ICD-10-CM

## 2021-02-17 NOTE — Addendum Note (Signed)
Addended by: Gerald Leitz A on: 02/17/2021 07:57 AM   Modules accepted: Orders

## 2021-02-17 NOTE — Telephone Encounter (Signed)
Orders for Surgery entered and submitted to Pre-Admit Testing.

## 2021-02-17 NOTE — Progress Notes (Signed)
[facility] Posting Form   Surgery Date/Time: Date: 03/02/2021  Surgeon: Dr. [name]  Surgery Location: Day Surgery  Inpt ( No  )   Outpt (Yes)   Obs ( No  )   Diagnosis: N20.1 Right Nephrolithiasis  -CPT: 35075   Surgery: Right URS/LL/Stent  Stop Anticoagulations: No  Cardiac/Medical/Pulmonary Clearance needed: No Clearance needed  *Orders entered into EPIC  Date: 02/17/21   *Case booked in EPIC  Date: 02/17/21  *Notified pt of Surgery: Date: 02/17/21  PRE-OP UA & CX: Ua, Cx and urine pregnancy  *Placed into Prior Authorization Work Que Date: 02/17/21   Assistant/laser/rep:No

## 2021-02-18 LAB — CULTURE, URINE COMPREHENSIVE

## 2021-02-18 NOTE — Telephone Encounter (Signed)
-----   Message from [name], [place] sent at 02/18/2021 12:51 PM EDT ----- Negative culture, no infection, proceed with plans for surgery. ----- Message ----- From: Lavone Neri Lab Results In Sent: 02/13/2021   5:37 AM EDT To: [name], PA-C

## 2021-02-18 NOTE — Telephone Encounter (Signed)
Patient notified via Mychart message, unable to reach patient on phone.

## 2021-02-27 ENCOUNTER — Other Ambulatory Visit: Payer: Self-pay

## 2021-02-27 ENCOUNTER — Other Ambulatory Visit
Admission: RE | Admit: 2021-02-27 | Discharge: 2021-02-27 | Disposition: A | Discharge status: Home / Self Care | Payer: Medicaid Other | Source: Ambulatory Visit | Attending: Urology | Admitting: Urology

## 2021-02-27 HISTORY — DX: Personal history of urinary calculi: Z87.442

## 2021-02-27 NOTE — Patient Instructions (Addendum)
Your procedure is scheduled on: Monday March 02, 2021. Report to Day Surgery inside [facility] 2nd floor stop by admissions desk first before getting on elevator.  To find out your arrival time please call [phone] between 1PM - 3PM on Friday February 27, 2021.  Remember: Instructions that are not followed completely may result in serious medical risk,  up to and including death, or upon the discretion of your surgeon and anesthesiologist your  surgery may need to be rescheduled.     _X__ 1. Do not eat food or drink fluids after midnight the night before your procedure.                 No chewing gum or hard candies.   __X__2.  On the morning of surgery brush your teeth with toothpaste and water, you                may rinse your mouth with mouthwash if you wish.  Do not swallow any toothpaste of mouthwash.     _X__ 3.  No Alcohol for 24 hours before or after surgery.   _X__ 4.  Do Not Smoke or use e-cigarettes For 24 Hours Prior to Your Surgery.                 Do not use any chewable tobacco products for at least 6 hours prior to                 Surgery.  _X__  5.  Do not use any recreational drugs (marijuana, cocaine, heroin, ecstasy, MDMA or other)                For at least one week prior to your surgery.  Combination of these drugs with anesthesia                May have life threatening results.  __X__ 6.  Notify your doctor if there is any change in your medical condition      (cold, fever, infections).     Do not wear jewelry, make-up, hairpins, clips or nail polish. Do not wear lotions, powders, or perfumes. You may wear deodorant. Do not shave 48 hours prior to surgery.  Do not bring valuables to the hospital.    [facility] is not responsible for any belongings or valuables.  Contacts, dentures or bridgework may not be worn into surgery. Leave your suitcase in the car. After surgery it may be brought to your room. For patients admitted  to the hospital, discharge time is determined by your treatment team.   Patients discharged the day of surgery will not be allowed to drive home.   Make arrangements for someone to be with you for the first 24 hours of your Same Day Discharge.  __X__ Take these medicines the morning of surgery with A SIP OF WATER:    1. None   2.   3.   4.  5.  6.  ____ Fleet Enema (as directed)   ____ Use CHG Soap (or wipes) as directed  ____ Use Benzoyl Peroxide Gel as instructed  ____ Use inhalers on the day of surgery  ____ Stop metformin 2 days prior to surgery    ____ Take 1/2 of usual insulin dose the night before surgery. No insulin the morning          of surgery.   ____ Call your PCP, cardiologist, or Pulmonologist if taking Coumadin/Plavix/aspirin and ask when to stop before  your surgery.   __X__ One Week prior to surgery- Stop Anti-inflammatories such as Ibuprofen, Aleve, Advil, Aspirin, Motrin, meloxicam (MOBIC), diclofenac, etodolac, ketorolac, Toradol, Daypro, piroxicam, Goody's or BC powders. OK TO USE TYLENOL IF NEEDED   __X__ Do not start any vitamins or supplements until after surgery. And Stop Melatonin today   ____ Bring C-Pap to the hospital.    If you have any questions regarding your pre-procedure instructions,  Please call Pre-admit Testing at [phone].

## 2021-03-02 ENCOUNTER — Ambulatory Visit: Payer: Medicaid Other | Admitting: Anesthesiology

## 2021-03-02 ENCOUNTER — Ambulatory Visit: Payer: Medicaid Other

## 2021-03-02 ENCOUNTER — Ambulatory Visit
Admission: RE | Admit: 2021-03-02 | Discharge: 2021-03-02 | Disposition: A | Discharge status: Home / Self Care | Payer: Medicaid Other | Attending: Urology | Admitting: Urology

## 2021-03-02 ENCOUNTER — Encounter
Admission: RE | Disposition: A | Discharge status: Home / Self Care | Payer: Self-pay | Source: Home / Self Care | Attending: Urology

## 2021-03-02 ENCOUNTER — Encounter: Payer: Self-pay | Admitting: Urology

## 2021-03-02 ENCOUNTER — Other Ambulatory Visit: Payer: Self-pay

## 2021-03-02 DIAGNOSIS — Z820 Family history of epilepsy and other diseases of the nervous system: Secondary | ICD-10-CM | POA: Insufficient documentation

## 2021-03-02 DIAGNOSIS — Z8249 Family history of ischemic heart disease and other diseases of the circulatory system: Secondary | ICD-10-CM | POA: Insufficient documentation

## 2021-03-02 DIAGNOSIS — N200 Calculus of kidney: Secondary | ICD-10-CM | POA: Diagnosis present

## 2021-03-02 DIAGNOSIS — Z87442 Personal history of urinary calculi: Secondary | ICD-10-CM | POA: Insufficient documentation

## 2021-03-02 HISTORY — PX: CYSTOSCOPY/URETEROSCOPY/HOLMIUM LASER/STENT PLACEMENT: SHX6546

## 2021-03-02 LAB — POCT PREGNANCY, URINE: Preg Test, Ur: NEGATIVE

## 2021-03-02 SURGERY — CYSTOSCOPY/URETEROSCOPY/HOLMIUM LASER/STENT PLACEMENT
Anesthesia: General | Site: Ureter | Laterality: Right

## 2021-03-02 MED ORDER — FENTANYL CITRATE (PF) 100 MCG/2ML IJ SOLN
INTRAMUSCULAR | Status: AC
  Filled 2021-03-02: qty 2

## 2021-03-02 MED ORDER — PROPOFOL 10 MG/ML IV BOLUS
INTRAVENOUS | Status: DC | PRN
  Administered 2021-03-02: 200 mg via INTRAVENOUS

## 2021-03-02 MED ORDER — EPHEDRINE SULFATE 50 MG/ML IJ SOLN
INTRAMUSCULAR | Status: DC | PRN
  Administered 2021-03-02 (×2): 10 mg via INTRAVENOUS

## 2021-03-02 MED ORDER — ONDANSETRON HCL 4 MG/2ML IJ SOLN
INTRAMUSCULAR | Status: DC | PRN
  Administered 2021-03-02: 4 mg via INTRAVENOUS

## 2021-03-02 MED ORDER — OXYCODONE HCL 5 MG PO TABS
5.0000 mg | ORAL_TABLET | Freq: Once | ORAL | Status: DC | PRN
Start: 2021-03-02 — End: 2021-03-02

## 2021-03-02 MED ORDER — GLYCOPYRROLATE 0.2 MG/ML IJ SOLN
INTRAMUSCULAR | Status: DC | PRN
  Administered 2021-03-02: .2 mg via INTRAVENOUS

## 2021-03-02 MED ORDER — CEFAZOLIN SODIUM-DEXTROSE 2-4 GM/100ML-% IV SOLN
INTRAVENOUS | Status: AC
  Filled 2021-03-02: qty 100

## 2021-03-02 MED ORDER — CHLORHEXIDINE GLUCONATE 0.12 % MT SOLN
OROMUCOSAL | Status: AC
  Administered 2021-03-02: 15 mL via OROMUCOSAL
  Filled 2021-03-02: qty 15

## 2021-03-02 MED ORDER — HYDROCODONE-ACETAMINOPHEN 5-325 MG PO TABS: 1.0000 | ORAL_TABLET | Freq: Four times a day (QID) | ORAL | 0 refills | Status: DC | PRN

## 2021-03-02 MED ORDER — IOHEXOL 180 MG/ML  SOLN
INTRAMUSCULAR | Status: DC | PRN
  Administered 2021-03-02: 9 mL

## 2021-03-02 MED ORDER — ONDANSETRON HCL 4 MG/2ML IJ SOLN
4.0000 mg | Freq: Once | INTRAMUSCULAR | Status: AC
  Administered 2021-03-02: 4 mg via INTRAVENOUS

## 2021-03-02 MED ORDER — MIDAZOLAM HCL 2 MG/2ML IJ SOLN
INTRAMUSCULAR | Status: AC
  Filled 2021-03-02: qty 2

## 2021-03-02 MED ORDER — SODIUM CHLORIDE FLUSH 0.9 % IV SOLN
INTRAVENOUS | Status: AC
  Filled 2021-03-02: qty 10

## 2021-03-02 MED ORDER — OXYCODONE HCL 5 MG/5ML PO SOLN: 5.0000 mg | Freq: Once | ORAL | Status: DC | PRN

## 2021-03-02 MED ORDER — STERILE WATER FOR IRRIGATION IR SOLN
Status: DC | PRN
  Administered 2021-03-02: 500 mL

## 2021-03-02 MED ORDER — LIDOCAINE HCL (CARDIAC) PF 100 MG/5ML IV SOSY
PREFILLED_SYRINGE | INTRAVENOUS | Status: DC | PRN
  Administered 2021-03-02: 100 mg via INTRAVENOUS

## 2021-03-02 MED ORDER — ACETAMINOPHEN 10 MG/ML IV SOLN
INTRAVENOUS | Status: DC | PRN
  Administered 2021-03-02: 1000 mg via INTRAVENOUS

## 2021-03-02 MED ORDER — FENTANYL CITRATE (PF) 100 MCG/2ML IJ SOLN
INTRAMUSCULAR | Status: DC | PRN
  Administered 2021-03-02: 100 ug via INTRAVENOUS

## 2021-03-02 MED ORDER — SODIUM CHLORIDE 0.9 % IR SOLN
Status: DC | PRN
  Administered 2021-03-02: 3000 mL

## 2021-03-02 MED ORDER — LACTATED RINGERS IV SOLN: INTRAVENOUS | Status: DC

## 2021-03-02 MED ORDER — CEFAZOLIN SODIUM-DEXTROSE 2-4 GM/100ML-% IV SOLN
2.0000 g | INTRAVENOUS | Status: AC
  Administered 2021-03-02: 2 g via INTRAVENOUS

## 2021-03-02 MED ORDER — DEXAMETHASONE SODIUM PHOSPHATE 10 MG/ML IJ SOLN
INTRAMUSCULAR | Status: DC | PRN
  Administered 2021-03-02: 10 mg via INTRAVENOUS

## 2021-03-02 MED ORDER — DEXMEDETOMIDINE (PRECEDEX) IN NS 20 MCG/5ML (4 MCG/ML) IV SYRINGE
PREFILLED_SYRINGE | INTRAVENOUS | Status: DC | PRN
  Administered 2021-03-02: 12 ug via INTRAVENOUS

## 2021-03-02 MED ORDER — FENTANYL CITRATE (PF) 100 MCG/2ML IJ SOLN: 25.0000 ug | INTRAMUSCULAR | Status: DC | PRN

## 2021-03-02 MED ORDER — ONDANSETRON HCL 4 MG/2ML IJ SOLN
INTRAMUSCULAR | Status: AC
  Filled 2021-03-02: qty 2

## 2021-03-02 MED ORDER — FAMOTIDINE 20 MG PO TABS: 20.0000 mg | ORAL_TABLET | Freq: Once | ORAL | Status: AC

## 2021-03-02 MED ORDER — ORAL CARE MOUTH RINSE: 15.0000 mL | Freq: Once | OROMUCOSAL | Status: AC

## 2021-03-02 MED ORDER — OXYBUTYNIN CHLORIDE 5 MG PO TABS: 5.0000 mg | ORAL_TABLET | Freq: Three times a day (TID) | ORAL | 0 refills | Status: DC | PRN

## 2021-03-02 MED ORDER — FAMOTIDINE 20 MG PO TABS
ORAL_TABLET | ORAL | Status: AC
  Administered 2021-03-02: 20 mg via ORAL
  Filled 2021-03-02: qty 1

## 2021-03-02 MED ORDER — SUGAMMADEX SODIUM 500 MG/5ML IV SOLN
INTRAVENOUS | Status: DC | PRN
  Administered 2021-03-02: 400 mg via INTRAVENOUS

## 2021-03-02 MED ORDER — MIDAZOLAM HCL 2 MG/2ML IJ SOLN
INTRAMUSCULAR | Status: DC | PRN
  Administered 2021-03-02: 2 mg via INTRAVENOUS

## 2021-03-02 MED ORDER — CHLORHEXIDINE GLUCONATE 0.12 % MT SOLN: 15.0000 mL | Freq: Once | OROMUCOSAL | Status: AC

## 2021-03-02 SURGICAL SUPPLY — 33 items
ADH LQ OCL WTPRF AMP STRL LF (MISCELLANEOUS) ×1
ADHESIVE MASTISOL STRL (MISCELLANEOUS) ×2 IMPLANT
BAG DRAIN CYSTO-URO LG1000N (MISCELLANEOUS) ×2 IMPLANT
BASKET ZERO TIP 1.9FR (BASKET) ×2 IMPLANT
BRUSH SCRUB EZ 1% IODOPHOR (MISCELLANEOUS) ×2 IMPLANT
BSKT STON RTRVL ZERO TP 1.9FR (BASKET) ×1
CATH URET FLEX-TIP 2 LUMEN 10F (CATHETERS) ×2 IMPLANT
CATH URETL OPEN 5X70 (CATHETERS) ×2 IMPLANT
CNTNR SPEC 2.5X3XGRAD LEK (MISCELLANEOUS) ×1
CONT SPEC 4OZ STER OR WHT (MISCELLANEOUS) ×1
CONT SPEC 4OZ STRL OR WHT (MISCELLANEOUS) ×1
CONTAINER SPEC 2.5X3XGRAD LEK (MISCELLANEOUS) ×1 IMPLANT
DRAPE UTILITY 15X26 TOWEL STRL (DRAPES) ×2 IMPLANT
DRSG TEGADERM 2-3/8X2-3/4 SM (GAUZE/BANDAGES/DRESSINGS) ×2 IMPLANT
GAUZE 4X4 16PLY ~~LOC~~+RFID DBL (SPONGE) ×4 IMPLANT
GLOVE SURG ENC MOIS LTX SZ6.5 (GLOVE) ×2 IMPLANT
GOWN STRL REUS W/ TWL LRG LVL3 (GOWN DISPOSABLE) ×2 IMPLANT
GOWN STRL REUS W/TWL LRG LVL3 (GOWN DISPOSABLE) ×4
GUIDEWIRE GREEN .038 145CM (MISCELLANEOUS) ×2 IMPLANT
GUIDEWIRE STR DUAL SENSOR (WIRE) ×2 IMPLANT
INFUSOR MANOMETER BAG 3000ML (MISCELLANEOUS) ×2 IMPLANT
IV NS IRRIG 3000ML ARTHROMATIC (IV SOLUTION) ×2 IMPLANT
KIT TURNOVER CYSTO (KITS) ×2 IMPLANT
MANIFOLD NEPTUNE II (INSTRUMENTS) ×2 IMPLANT
PACK CYSTO AR (MISCELLANEOUS) ×2 IMPLANT
SET CYSTO W/LG BORE CLAMP LF (SET/KITS/TRAYS/PACK) ×2 IMPLANT
SHEATH URETERAL 12FRX35CM (MISCELLANEOUS) ×2 IMPLANT
STENT URET 6FRX24 CONTOUR (STENTS) ×2 IMPLANT
STENT URET 6FRX26 CONTOUR (STENTS) IMPLANT
SURGILUBE 2OZ TUBE FLIPTOP (MISCELLANEOUS) ×2 IMPLANT
TRACTIP FLEXIVA PULSE ID 200 (Laser) ×2 IMPLANT
WATER STERILE IRR 1000ML POUR (IV SOLUTION) IMPLANT
WATER STERILE IRR 500ML POUR (IV SOLUTION) ×2 IMPLANT

## 2021-03-02 NOTE — Transfer of Care (Signed)
Immediate Anesthesia Transfer of Care Note  Patient: Evelyn Caldwell  Procedure(s) Performed: CYSTOSCOPY/URETEROSCOPY/HOLMIUM LASER/STENT PLACEMENT (Right: Ureter)  Patient Location: PACU  Anesthesia Type:General  Level of Consciousness: awake, alert  and patient cooperative  Airway & Oxygen Therapy: Patient Spontanous Breathing and Patient connected to face mask oxygen  Post-op Assessment: Report given to RN and Post -op Vital signs reviewed and stable  Post vital signs: Reviewed and stable  Last Vitals:  Vitals Value Taken Time  BP 116/59 03/02/21 1645  Temp    Pulse 98 03/02/21 1646  Resp 19 03/02/21 1646  SpO2 92 % 03/02/21 1646  Vitals shown include unvalidated device data.  Last Pain:  Vitals:   03/02/21 1356  TempSrc: Oral  PainSc: 2          Complications: No notable events documented.

## 2021-03-02 NOTE — Discharge Instructions (Addendum)
You have a ureteral stent in place.  This is a tube that extends from your kidney to your bladder.  This may cause urinary bleeding, burning with urination, and urinary frequency.  Please call our office or present to the ED if you develop fevers >101 or pain which is not able to be controlled with oral pain medications.  You may be given either Flomax and/ or ditropan to help with bladder spasms and stent pain in addition to pain medications.    Your stent is on a string taped to your left inner thigh.  This may be removed on Thursday.  If you have any issues, please call our office.   [place] [address] [place], [place] [address] [phone]   AMBULATORY SURGERY  DISCHARGE INSTRUCTIONS   The drugs that you were given will stay in your system until tomorrow so for the next 24 hours you should not:  Drive an automobile Make any legal decisions Drink any alcoholic beverage   You may resume regular meals tomorrow.  Today it is better to start with liquids and gradually work up to solid foods.  You may eat anything you prefer, but it is better to start with liquids, then soup and crackers, and gradually work up to solid foods.   Please notify your doctor immediately if you have any unusual bleeding, trouble breathing, redness and pain at the surgery site, drainage, fever, or pain not relieved by medication.    Additional Instructions:        Please contact your physician with any problems or Same Day Surgery at [phone], Monday through Friday 6 am to 4 pm, or [place] at [facility] number at [phone].

## 2021-03-02 NOTE — Interval H&P Note (Signed)
History and Physical Interval Note:  03/02/2021 3:33 PM  Evelyn Caldwell  has presented today for surgery, with the diagnosis of Right Nephrolithiasis.  The various methods of treatment have been discussed with the patient and family. After consideration of risks, benefits and other options for treatment, the patient has consented to  Procedure(s): CYSTOSCOPY/URETEROSCOPY/HOLMIUM LASER/STENT PLACEMENT (Right) as a surgical intervention.  The patient's history has been reviewed, patient examined, no change in status, stable for surgery.  I have reviewed the patient's chart and labs.  Questions were answered to the patient's satisfaction.    RRR CTAB  [name]

## 2021-03-02 NOTE — Op Note (Signed)
Date of procedure: 03/02/21  Preoperative diagnosis:  Right nephrolithiasis  Postoperative diagnosis:  Same as above  Procedure: Right ureteroscopy laser lithotripsy Right retrograde pyelogram Basket extraction of stone fragment Right ureteral stent placement on tether Interpretation fluoroscopy less than 30 minutes  Surgeon: [name], MD  Anesthesia: General  Complications: None  Intraoperative findings: 8 mm along with smaller nonobstructing stone obliterated.  All fragments removed.  No complications.  Stent placed on tether.  EBL: Minimal  Specimens: Stone fragment  Drains: 6 x 24 French double-J ureteral stent on right with tether  Indication: Evelyn Caldwell is a 30 y.o. patient with nonobstructing 8 mm stone as well as nonobstructing stones electing intervention.  After reviewing the management options for treatment, she elected to proceed with the above surgical procedure(s). We have discussed the potential benefits and risks of the procedure, side effects of the proposed treatment, the likelihood of the patient achieving the goals of the procedure, and any potential problems that might occur during the procedure or recuperation. Informed consent has been obtained.  Description of procedure:  The patient was taken to the operating room and general anesthesia was induced.  The patient was placed in the dorsal lithotomy position, prepped and draped in the usual sterile fashion, and preoperative antibiotics were administered. A preoperative time-out was performed.   A 21 French the scope was advanced per urethra into the bladder.  Attention was turned to the right side.  On scout imaging, the a millimeter stone as well as a smaller nonobstructing stone was identified.  A sensor wire was replaced up to the level of the kidney.  A dual-lumen access sheath was used to introduce a Super Stiff wire up to the level of the kidney.  The sensor wire was snapped in place  as a safety wire.  A Cook 12/14 French ureteral access sheath was then advanced to the proximal ureter and the inner cannula and Super Stiff wire were removed.  I advanced a dual-lumen 8 [place] digital flexible ureteroscope into the renal pelvis and identified the 8 mm stone.  I brought in a 240 m laser fiber and using just settings of 0.3 J and 80 Hz, I fragmented the stone into innumerable smaller pieces.  The larger pieces were extracted via a 1.9 [place] tipless nitinol basket.  A smaller nonobstructing stone was also obliterated and removed.  Each and every calyx was then directly visualized.  I injected contrast through the scope.  Retrograde pyelogram to outline the collecting system to ensure that each and every calyx was identified and cleared.  No residual stone fragments remained other than tiny particles about the size of the laser fiber.  The scope was impacted in length the ureter inspecting along the way.  There were no ureteral fragments or injuries appreciated.  99 French stent was advanced over the wire up to the level of the kidney.  The wire was withdrawn and a partial coil was noted within the renal pelvis as well as full coil within the bladder.  The patient was cleaned and dried and the bladder was emptied.  The stent string was then affixed to the patient's left inner thigh using Mastisol and Tegaderm.  She was reversed from anesthesia after being repositioned in supine position and taken the PACU stable condition.  Plan: She may remove her own stent on Thursday.  We will have her follow-up in 4 weeks with renal ultrasound prior.  [name], M.D.

## 2021-03-02 NOTE — Anesthesia Preprocedure Evaluation (Signed)
Anesthesia Evaluation  Patient identified by MRN, date of birth, ID band Patient awake    Reviewed: Allergy & Precautions, NPO status , Patient's Chart, lab work & pertinent test results  History of Anesthesia Complications Negative for: history of anesthetic complications  Airway Mallampati: III  TM Distance: >3 FB Neck ROM: full    Dental  (+) Chipped   Pulmonary neg pulmonary ROS, neg shortness of breath,    Pulmonary exam normal        Cardiovascular Exercise Tolerance: Good (-) angina(-) Past MI and (-) DOE negative cardio ROS Normal cardiovascular exam     Neuro/Psych  Headaches, negative psych ROS   GI/Hepatic negative GI ROS, Neg liver ROS,   Endo/Other  negative endocrine ROS  Renal/GU Renal disease     Musculoskeletal   Abdominal   Peds  Hematology negative hematology ROS (+)   Anesthesia Other Findings Past Medical History: No date: Acne No date: Anxiety No date: History of kidney stones No date: Kidney stone No date: Vulvar lesion  Past Surgical History: 07/27/2018: CYSTOSCOPY WITH STENT PLACEMENT; Right     Comment:  Procedure: CYSTOSCOPY WITH STENT PLACEMENT;  Surgeon:               Billey Co, MD;  Location: [facility] ORS;  Service:               Urology;  Laterality: Right; 08/03/2018: CYSTOSCOPY/URETEROSCOPY/HOLMIUM LASER/STENT PLACEMENT; Right     Comment:  Procedure: CYSTOSCOPY/URETEROSCOPY/HOLMIUM LASER/STENT               EXCHANGE **WITH NST BEFORE AND AFTER**;  Surgeon:               [name], MD;  Location: [facility] ORS;  Service:               Urology;  Laterality: Right; 02/15/2019: EXTRACORPOREAL SHOCK WAVE LITHOTRIPSY; Left     Comment:  Procedure: EXTRACORPOREAL SHOCK WAVE LITHOTRIPSY (ESWL);              Surgeon: [name], MD;  Location: [facility] ORS;                Service: Urology;  Laterality: Left; No date: none  BMI    Body Mass Index: 30.54 kg/m       Reproductive/Obstetrics negative OB ROS                             Anesthesia Physical Anesthesia Plan  ASA: 2  Anesthesia Plan: General ETT   Post-op Pain Management:    Induction: Intravenous  PONV Risk Score and Plan: Ondansetron, Dexamethasone, Midazolam and Treatment may vary due to age or medical condition  Airway Management Planned: Oral ETT  Additional Equipment:   Intra-op Plan:   Post-operative Plan: Extubation in OR  Informed Consent: I have reviewed the patients History and Physical, chart, labs and discussed the procedure including the risks, benefits and alternatives for the proposed anesthesia with the patient or authorized representative who has indicated his/her understanding and acceptance.     Dental Advisory Given  Plan Discussed with: Anesthesiologist, CRNA and Surgeon  Anesthesia Plan Comments: (Patient consented for risks of anesthesia including but not limited to:  - adverse reactions to medications - damage to eyes, teeth, lips or other oral mucosa - nerve damage due to positioning  - sore throat or hoarseness - Damage to heart, brain, nerves, lungs, other parts of body or  loss of life  Patient voiced understanding.)        Anesthesia Quick Evaluation

## 2021-03-02 NOTE — Anesthesia Procedure Notes (Signed)
Procedure Name: Intubation Date/Time: 03/02/2021 4:01 PM Performed by: Kelton Pillar, CRNA Pre-anesthesia Checklist: Patient identified, Emergency Drugs available, Suction available and Patient being monitored Patient Re-evaluated:Patient Re-evaluated prior to induction Oxygen Delivery Method: Circle system utilized Preoxygenation: Pre-oxygenation with 100% oxygen Induction Type: IV induction Ventilation: Mask ventilation without difficulty Laryngoscope Size: McGraph and 3 Grade View: Grade I Tube type: Oral Tube size: 6.5 mm Number of attempts: 1 Airway Equipment and Method: Stylet and Oral airway Placement Confirmation: ETT inserted through vocal cords under direct vision, positive ETCO2, breath sounds checked- equal and bilateral and CO2 detector Secured at: 21 cm Tube secured with: Tape Dental Injury: Teeth and Oropharynx as per pre-operative assessment

## 2021-03-03 ENCOUNTER — Encounter: Payer: Self-pay | Admitting: Urology

## 2021-03-03 NOTE — Anesthesia Postprocedure Evaluation (Signed)
Anesthesia Post Note  Patient: [name]  Procedure(s) Performed: CYSTOSCOPY/URETEROSCOPY/HOLMIUM LASER/STENT PLACEMENT (Right: Ureter)  Patient location during evaluation: PACU Anesthesia Type: General Level of consciousness: awake and alert Pain management: pain level controlled Vital Signs Assessment: post-procedure vital signs reviewed and stable Respiratory status: spontaneous breathing, nonlabored ventilation, respiratory function stable and patient connected to nasal cannula oxygen Cardiovascular status: blood pressure returned to baseline and stable Postop Assessment: no apparent nausea or vomiting Anesthetic complications: no   No notable events documented.   Last Vitals:  Vitals:   03/02/21 1715 03/02/21 1724  BP: 103/67 114/69  Pulse: 79 72  Resp: 16 18  Temp: 36.7 C 36.7 C  SpO2: 98% 97%    Last Pain:  Vitals:   03/02/21 1724  TempSrc: Temporal  PainSc: 2                  Precious Haws Braxden Lovering

## 2021-03-05 ENCOUNTER — Emergency Department
Admission: EM | Admit: 2021-03-05 | Discharge: 2021-03-05 | Disposition: A | Discharge status: Home / Self Care | Payer: Medicaid Other | Attending: Emergency Medicine | Admitting: Emergency Medicine

## 2021-03-05 ENCOUNTER — Telehealth: Payer: Self-pay | Admitting: Family Medicine

## 2021-03-05 ENCOUNTER — Other Ambulatory Visit: Payer: Self-pay

## 2021-03-05 ENCOUNTER — Telehealth: Payer: Self-pay | Admitting: Urology

## 2021-03-05 DIAGNOSIS — T819XXA Unspecified complication of procedure, initial encounter: Secondary | ICD-10-CM | POA: Insufficient documentation

## 2021-03-05 DIAGNOSIS — R309 Painful micturition, unspecified: Secondary | ICD-10-CM | POA: Insufficient documentation

## 2021-03-05 DIAGNOSIS — R109 Unspecified abdominal pain: Secondary | ICD-10-CM | POA: Insufficient documentation

## 2021-03-05 DIAGNOSIS — Z5321 Procedure and treatment not carried out due to patient leaving prior to being seen by health care provider: Secondary | ICD-10-CM | POA: Insufficient documentation

## 2021-03-05 DIAGNOSIS — N200 Calculus of kidney: Secondary | ICD-10-CM | POA: Diagnosis not present

## 2021-03-05 LAB — COMPREHENSIVE METABOLIC PANEL
ALT: 16 U/L (ref 0–44)
AST: 19 U/L (ref 15–41)
Albumin: 3.7 g/dL (ref 3.5–5.0)
Alkaline Phosphatase: 82 U/L (ref 38–126)
Anion gap: 7 (ref 5–15)
BUN: 21 mg/dL — ABNORMAL HIGH (ref 6–20)
CO2: 28 mmol/L (ref 22–32)
Calcium: 9.5 mg/dL (ref 8.9–10.3)
Chloride: 102 mmol/L (ref 98–111)
Creatinine, Ser: 0.71 mg/dL (ref 0.44–1.00)
GFR, Estimated: >60 mL/min (ref >60)
Glucose, Bld: 101 mg/dL — ABNORMAL HIGH (ref 70–99)
Potassium: 3.8 mmol/L (ref 3.5–5.1)
Sodium: 137 mmol/L (ref 135–145)
Total Bilirubin: 0.5 mg/dL (ref 0.3–1.2)
Total Protein: 7.2 g/dL (ref 6.5–8.1)

## 2021-03-05 LAB — CBC WITH DIFFERENTIAL/PLATELET
Abs Immature Granulocytes: 0.02 10*3/uL (ref 0.00–0.07)
Basophils Absolute: 0 10*3/uL (ref 0.0–0.1)
Basophils Relative: 0 %
Eosinophils Absolute: 0.1 10*3/uL (ref 0.0–0.5)
Eosinophils Relative: 1 %
HCT: 38 % (ref 36.0–46.0)
Hemoglobin: 12.7 g/dL (ref 12.0–15.0)
Immature Granulocytes: 0 %
Lymphocytes Relative: 25 %
Lymphs Abs: 2.1 10*3/uL (ref 0.7–4.0)
MCH: 29.3 pg (ref 26.0–34.0)
MCHC: 33.4 g/dL (ref 30.0–36.0)
MCV: 87.8 fL (ref 80.0–100.0)
Monocytes Absolute: 0.4 10*3/uL (ref 0.1–1.0)
Monocytes Relative: 5 %
Neutro Abs: 5.9 10*3/uL (ref 1.7–7.7)
Neutrophils Relative %: 69 %
Platelets: 223 10*3/uL (ref 150–400)
RBC: 4.33 MIL/uL (ref 3.87–5.11)
RDW: 13 % (ref 11.5–15.5)
WBC: 8.6 10*3/uL (ref 4.0–10.5)
nRBC: 0 % (ref 0.0–0.2)

## 2021-03-05 NOTE — Telephone Encounter (Signed)
Spoke to patient and she will take Oxybutynin and call if symptoms do not subside.

## 2021-03-05 NOTE — ED Triage Notes (Signed)
Pt to ER via POV with complaints of right sided flank pain, reports being diagnosed with a kidney stone and had a stent placed on Monday afternoon, she pulled the stent this morning and has been having sharp/ stabbing pains to the site since removal. Has been taking prescribed pain medication without relief. Contacted urologist who advised she continues to take pain meds.   Pt reports burning with urination/ dark/ cloudy.

## 2021-03-05 NOTE — Telephone Encounter (Signed)
Patient called and left a voicemail that she pulled her stent this morning and she is having a lot of pain. can you call her please [phone]  Thanks, Sharyn Lull

## 2021-03-05 NOTE — Telephone Encounter (Signed)
Patient called and states she took her stent out and has been having bladder discomfort. I informed her that it is bladder spasms. She states she has Oxybutynin and Vicodin. She said she has took the Vicodin and it has helped a little. I advised hr to take the Oxybutynin as well and to drink plenty of fluids today. Patient voiced understanding and will call our office if her symptoms get worse.

## 2021-03-06 ENCOUNTER — Other Ambulatory Visit: Payer: Self-pay

## 2021-03-06 IMAGING — DX PORTABLE CHEST - 1 VIEW
1 series · 1 of 1 positions shown · non-contrast
Comparison: None.

CLINICAL DATA: Chest tightness and cough

EXAM:
PORTABLE CHEST 1 VIEW

[chest ap]
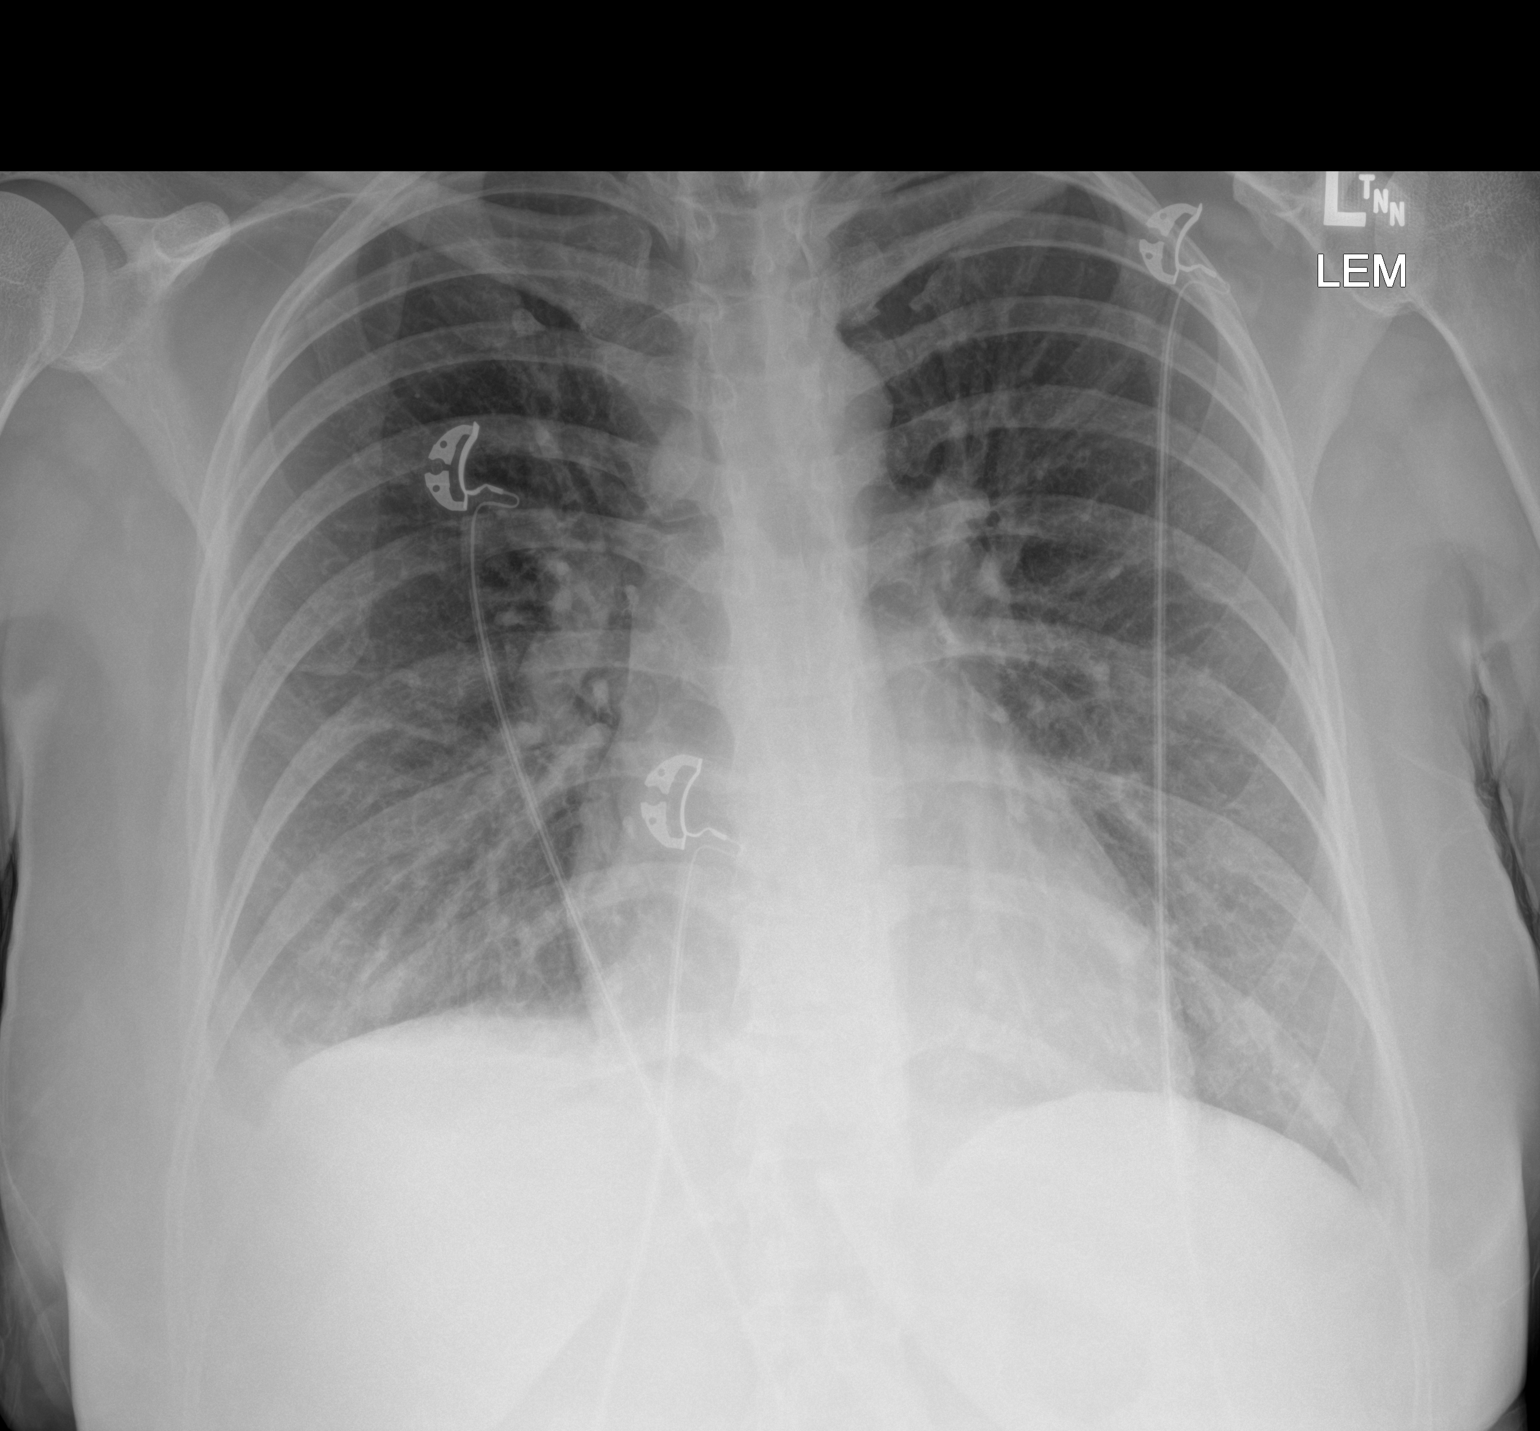

[1 of 1 positions shown; findings below may reference images not displayed]

FINDINGS: There is ill-defined opacity in the lateral right base. Lungs
elsewhere clear. Heart size and pulmonary vascularity are normal. No
adenopathy. No pneumothorax. No bone lesions.
IMPRESSION: Ill-defined opacity lateral right base, concerning for focus of
pneumonia in this area. Lungs elsewhere clear. No adenopathy.

## 2021-03-06 MED ORDER — TAMSULOSIN HCL 0.4 MG PO CAPS: 0.4000 mg | ORAL_CAPSULE | Freq: Every day | ORAL | 0 refills | Status: DC

## 2021-03-06 MED ORDER — HYDROCODONE-ACETAMINOPHEN 5-325 MG PO TABS: 1.0000 | ORAL_TABLET | Freq: Four times a day (QID) | ORAL | 0 refills | Status: DC | PRN

## 2021-03-06 NOTE — Telephone Encounter (Signed)
Patient called and is having some right sided flank pain after surgery and stent removal. Per sam can take flomax and will refill pain medication if symptoms worsen or she develops fever she needs to go to ER. Pt aware and verbalized understanding. She would like [facility] on [address]

## 2021-03-06 NOTE — Telephone Encounter (Signed)
Medications sent. Pt aware

## 2021-03-07 LAB — CALCULI, WITH PHOTOGRAPH (CLINICAL LAB)
Calcium Oxalate Dihydrate: 20 %
Calcium Oxalate Monohydrate: 70 %
Hydroxyapatite: 10 %
Weight Calculi: 13 mg

## 2021-03-09 ENCOUNTER — Other Ambulatory Visit: Payer: Self-pay | Admitting: Urology

## 2021-03-30 ENCOUNTER — Other Ambulatory Visit: Payer: Self-pay | Admitting: Certified Nurse Midwife

## 2021-03-30 DIAGNOSIS — R232 Flushing: Secondary | ICD-10-CM

## 2021-04-02 ENCOUNTER — Other Ambulatory Visit: Payer: Medicaid Other

## 2021-04-02 ENCOUNTER — Other Ambulatory Visit: Payer: Self-pay | Admitting: Certified Nurse Midwife

## 2021-04-02 ENCOUNTER — Other Ambulatory Visit: Payer: Self-pay

## 2021-04-02 DIAGNOSIS — R232 Flushing: Secondary | ICD-10-CM

## 2021-04-02 MED ORDER — FLUOXETINE HCL 10 MG PO CAPS: 10.0000 mg | ORAL_CAPSULE | Freq: Every day | ORAL | 3 refills | Status: DC

## 2021-04-03 ENCOUNTER — Other Ambulatory Visit: Payer: Self-pay | Admitting: Physician Assistant

## 2021-04-03 LAB — THYROID PANEL WITH TSH
Free Thyroxine Index: 2 (ref 1.2–4.9)
T3 Uptake Ratio: 25 % (ref 24–39)
T4, Total: 8 ug/dL (ref 4.5–12.0)
TSH: 1.58 u[IU]/mL (ref 0.450–4.500)

## 2021-04-09 ENCOUNTER — Telehealth: Payer: Self-pay | Admitting: Certified Nurse Midwife

## 2021-04-09 NOTE — Telephone Encounter (Signed)
Pt called in stating that she is still having issues with the hot flashes and now have new issues painful intercourse and itching.  Pt would like to know what she should do and if it could be her medication.  Pt would like to have a call back.

## 2021-04-14 ENCOUNTER — Other Ambulatory Visit (HOSPITAL_COMMUNITY)
Admission: RE | Admit: 2021-04-14 | Discharge: 2021-04-14 | Disposition: A | Discharge status: Home / Self Care | Payer: Medicaid Other | Source: Ambulatory Visit | Attending: Certified Nurse Midwife | Admitting: Certified Nurse Midwife

## 2021-04-14 ENCOUNTER — Other Ambulatory Visit: Payer: Self-pay

## 2021-04-14 ENCOUNTER — Ambulatory Visit (INDEPENDENT_AMBULATORY_CARE_PROVIDER_SITE_OTHER): Payer: Medicaid Other | Admitting: Certified Nurse Midwife

## 2021-04-14 VITALS — BP 102/67 | HR 80

## 2021-04-14 DIAGNOSIS — N898 Other specified noninflammatory disorders of vagina: Secondary | ICD-10-CM

## 2021-04-14 NOTE — Addendum Note (Signed)
Addended by: Ova Freshwater on: 04/14/2021 03:25 PM   Modules accepted: Orders

## 2021-04-14 NOTE — Progress Notes (Signed)
GYN ENCOUNTER NOTE  Subjective:       Evelyn Caldwell is a 30 y.o. [phone] female is here for gynecologic evaluation of the following issues:  1. Increased vaginal discharge with itching and irritation x 1 wk. .     Gynecologic History No LMP recorded. (Menstrual status: IUD). Contraception: IUD Last Pap: 08/18/2016. Results were: normal Last mammogram: n/a  Obstetric History OB History  Gravida Para Term Preterm AB Living  2 2 2     2   SAB IAB Ectopic Multiple Live Births        0 2    # Outcome Date GA Lbr Len/2nd Weight Sex Delivery Anes PTL Lv  2 Term 10/18/18 [redacted]w[redacted]d 06:30 / 01:49 8 lb 12.4 oz (3.98 kg) F Vag-Spont EPI  LIV  1 Term 03/09/16 [redacted]w[redacted]d  8 lb 1.6 oz (3.674 kg) F Vag-Spont   LIV    Past Medical History:  Diagnosis Date   Acne    Anxiety    History of kidney stones    Kidney stone    Vulvar lesion     Past Surgical History:  Procedure Laterality Date   CYSTOSCOPY WITH STENT PLACEMENT Right 07/27/2018   Procedure: CYSTOSCOPY WITH STENT PLACEMENT;  Surgeon: Billey Co, MD;  Location: [facility];  Service: Urology;  Laterality: Right;   CYSTOSCOPY/URETEROSCOPY/HOLMIUM LASER/STENT PLACEMENT Right 08/03/2018   Procedure: CYSTOSCOPY/URETEROSCOPY/HOLMIUM LASER/STENT EXCHANGE **WITH NST BEFORE AND AFTER**;  Surgeon: [name], MD;  Location: [facility];  Service: Urology;  Laterality: Right;   CYSTOSCOPY/URETEROSCOPY/HOLMIUM LASER/STENT PLACEMENT Right 03/02/2021   Procedure: CYSTOSCOPY/URETEROSCOPY/HOLMIUM LASER/STENT PLACEMENT;  Surgeon: [name], MD;  Location: [facility];  Service: Urology;  Laterality: Right;   EXTRACORPOREAL SHOCK WAVE LITHOTRIPSY Left 02/15/2019   Procedure: EXTRACORPOREAL SHOCK WAVE LITHOTRIPSY (ESWL);  Surgeon: [name], MD;  Location: [facility];  Service: Urology;  Laterality: Left;   none      Current Outpatient Medications on File Prior to Visit  Medication Sig Dispense Refill   acetaminophen (TYLENOL) 500 MG tablet  Take 500-1,000 mg by mouth every 6 (six) hours as needed (pain).     FLUoxetine (PROZAC) 10 MG capsule Take 1 capsule (10 mg total) by mouth daily. 30 capsule 3   Melatonin 10 MG TABS Take 10 mg by mouth at bedtime as needed (sleep).     oxybutynin (DITROPAN) 5 MG tablet TAKE 1 TABLET(5 MG) BY MOUTH EVERY 8 HOURS AS NEEDED FOR BLADDER SPASMS 30 tablet 0   spironolactone (ALDACTONE) 100 MG tablet TAKE 1 TABLET(100 MG) BY MOUTH DAILY (Patient taking differently: Take 100 mg by mouth every evening.) 30 tablet 11   Clindamycin-Benzoyl Per, Refr, (DUAC) gel Apply to face every morning for acne. (Patient not taking: Reported on 02/25/2021) 45 g 6   DIFFERIN 0.3 % gel Apply a pea-sized amount to face and shoulders every night as tolerated. (Patient taking differently: Apply 1 application topically at bedtime as needed (skin acne (face/shoulders)).) 45 g 6   FLUoxetine (PROZAC) 20 MG capsule TAKE 1 CAPSULE(20 MG) BY MOUTH DAILY (Patient taking differently: Take 20 mg by mouth every evening.) 30 capsule 5   HYDROcodone-acetaminophen (NORCO/VICODIN) 5-325 MG tablet Take 1-2 tablets by mouth every 6 (six) hours as needed for moderate pain. 10 tablet 0   Polyethyl Glycol-Propyl Glycol (LUBRICANT EYE DROPS) 0.4-0.3 % SOLN Place 1-2 drops into both eyes 3 (three) times daily as needed (dry/irritated eyes.).     tamsulosin (FLOMAX) 0.4 MG CAPS capsule Take 1 capsule (0.4 mg  total) by mouth daily. 30 capsule 0   No current facility-administered medications on file prior to visit.    No Known Allergies  Social History   Socioeconomic History   Marital status: Married    Spouse name: Not on file   Number of children: Not on file   Years of education: Not on file   Highest education level: Not on file  Occupational History   Not on file  Tobacco Use   Smoking status: Never   Smokeless tobacco: Never  Vaping Use   Vaping Use: Never used  Substance and Sexual Activity   Alcohol use: Yes    Comment:  occasionally   Drug use: No   Sexual activity: Yes    Birth control/protection: I.U.D.  Other Topics Concern   Not on file  Social History Narrative   Not on file   Social Determinants of Health   Financial Resource Strain: Not on file  Food Insecurity: Not on file  Transportation Needs: Not on file  Physical Activity: Not on file  Stress: Not on file  Social Connections: Not on file  Intimate Partner Violence: Not on file    Family History  Problem Relation Age of Onset   Congestive Heart Failure Maternal Grandfather    Parkinson's disease Paternal Grandmother    Kidney failure Cousin    Kidney cancer Neg Hx    Prostate cancer Neg Hx    Breast cancer Neg Hx    Ovarian cancer Neg Hx    Colon cancer Neg Hx     The following portions of the patient's history were reviewed and updated as appropriate: allergies, current medications, past family history, past medical history, past social history, past surgical history and problem list.  Review of Systems Review of Systems - Negative except as mentioned in HPI Review of Systems - General ROS: negative for - chills, fatigue, fever, hot flashes, malaise or night sweats Hematological and Lymphatic ROS: negative for - bleeding problems or swollen lymph nodes Gastrointestinal ROS: negative for - abdominal pain, blood in stools, change in bowel habits and nausea/vomiting Musculoskeletal ROS: negative for - joint pain, muscle pain or muscular weakness Genito-Urinary ROS: negative for - change in menstrual cycle, dysmenorrhea, dyspareunia, dysuria,  genital ulcers, hematuria, incontinence, irregular/heavy menses, nocturia or pelvic pain. Positive for  genital discharge with irritation   Objective:   102/69, p-80 CONSTITUTIONAL: Well-developed, well-nourished female in no acute distress.  HENT:  Normocephalic, atraumatic.  NECK: Normal range of motion, supple, no masses.  Normal thyroid.  SKIN: Skin is warm and dry. No rash noted.  Not diaphoretic. No erythema. No pallor. [place]: Alert and oriented to person, place, and time. PSYCHIATRIC: Normal mood and affect. Normal behavior. Normal judgment and thought content. CARDIOVASCULAR:Not Examined RESPIRATORY: Not Examined BREASTS: Not Examined ABDOMEN: Soft, non distended; Non tender.  No Organomegaly. PELVIC:  External Genitalia: Normal  BUS: Normal  Vagina: Normal  Cervix: Normal, copious amount of white discharge noted, mild redness.    MUSCULOSKELETAL: Normal range of motion. No tenderness.  No cyanosis, clubbing, or edema.     Assessment:   Vaginal discharge    Plan:   Vaginal swab collected ,discussed IUD and increased frequency yeast/BV infections. Encouraged use of boric acid vaginal suppositories . Sample given.  Will follow up with swab results.   Philip Aspen, CNM

## 2021-04-14 NOTE — Patient Instructions (Signed)

## 2021-04-16 ENCOUNTER — Other Ambulatory Visit: Payer: Self-pay | Admitting: Certified Nurse Midwife

## 2021-04-16 ENCOUNTER — Encounter: Payer: Self-pay | Admitting: Certified Nurse Midwife

## 2021-04-16 LAB — CERVICOVAGINAL ANCILLARY ONLY
Bacterial Vaginitis (gardnerella): NEGATIVE
Candida Glabrata: NEGATIVE
Candida Vaginitis: POSITIVE — AB
Comment: NEGATIVE
Comment: NEGATIVE
Comment: NEGATIVE

## 2021-04-16 MED ORDER — FLUCONAZOLE 150 MG PO TABS: 150.0000 mg | ORAL_TABLET | Freq: Once | ORAL | 1 refills | Status: AC

## 2021-06-16 ENCOUNTER — Ambulatory Visit: Payer: Medicaid Other

## 2021-07-30 ENCOUNTER — Encounter: Payer: Self-pay | Admitting: Urology

## 2021-07-31 NOTE — Telephone Encounter (Signed)
Spoke with patient, pain is an ache on her right flank. Not a new pain, pain has been ongoing.  Denies any other urinary issues.  ?

## 2021-08-03 ENCOUNTER — Other Ambulatory Visit: Payer: Self-pay

## 2021-08-03 DIAGNOSIS — N200 Calculus of kidney: Secondary | ICD-10-CM

## 2021-08-03 DIAGNOSIS — R109 Unspecified abdominal pain: Secondary | ICD-10-CM

## 2021-08-04 NOTE — Progress Notes (Signed)
? ?08/05/21 ?8:17 AM  ? ?Manpreet Liz Claiborne ?1991-01-10 ?[id number] ? ?Referring provider:  ?No referring provider defined for this encounter. ?Chief Complaint  ?Patient presents with  ? Nephrolithiasis  ? ? ? ?HPI: ?Melady Chow is a 31 y.o.female with a personal history of nephrolithiasis and flank pain, who presents today for a follow-up with KUB.  ? ?She is s/p ureteroscopy on 03/02/2021. Intraoperative findings: 8 mm along with smaller nonobstructing stone obliterated.  All fragments removed.  No complications.  Stent placed on tether. She removed her own stent.  Stone analysis showed 70% calcium oxalate monohydrate, 20% calcium oxalate dihydrate, and 10% hydroxyapatite.  ?  ?After her ureteroscopy for unknown reasons she did not have a follow-up RUS. She reached out via mychart with right flank pain  and concern surrounding her GFR.   ? ?KUB was personally reviewed today and does not visualize any obvious stone burden.  ? ?She reports that she had recent labs and her GFRC was above normal. She denies any blood in her urine she has dark urine. She reports that she has aching flank pain on the right which is similar to when she had previous kidney stones.  ? ?She reports that she was bitten by a great dane she is taking precautions to avoid infection.  ? ?PMH: ?Past Medical History:  ?Diagnosis Date  ? Acne   ? Anxiety   ? History of kidney stones   ? Kidney stone   ? Vulvar lesion   ? ? ?Surgical History: ?Past Surgical History:  ?Procedure Laterality Date  ? CYSTOSCOPY WITH STENT PLACEMENT Right 07/27/2018  ? Procedure: CYSTOSCOPY WITH STENT PLACEMENT;  Surgeon: Billey Co, MD;  Location: [facility];  Service: Urology;  Laterality: Right;  ? CYSTOSCOPY/URETEROSCOPY/HOLMIUM LASER/STENT PLACEMENT Right 08/03/2018  ? Procedure: CYSTOSCOPY/URETEROSCOPY/HOLMIUM LASER/STENT EXCHANGE **WITH NST BEFORE AND AFTER**;  Surgeon: [name], MD;  Location: [facility];  Service: Urology;  Laterality: Right;   ? CYSTOSCOPY/URETEROSCOPY/HOLMIUM LASER/STENT PLACEMENT Right 03/02/2021  ? Procedure: CYSTOSCOPY/URETEROSCOPY/HOLMIUM LASER/STENT PLACEMENT;  Surgeon: [name], MD;  Location: [facility];  Service: Urology;  Laterality: Right;  ? EXTRACORPOREAL SHOCK WAVE LITHOTRIPSY Left 02/15/2019  ? Procedure: EXTRACORPOREAL SHOCK WAVE LITHOTRIPSY (ESWL);  Surgeon: [name], MD;  Location: [facility];  Service: Urology;  Laterality: Left;  ? none    ? ? ?Home Medications:  ?Allergies as of 08/05/2021   ?Not on File ?  ? ?  ?Medication List  ?  ? ?  ? Accurate as of August 05, 2021 11:59 PM. If you have any questions, ask your nurse or doctor.  ?  ?  ? ?  ? ?acetaminophen 500 MG tablet ?Commonly known as: TYLENOL ?Take 500-1,000 mg by mouth every 6 (six) hours as needed (pain). ?  ?Clindamycin-Benzoyl Per (Refr) gel ?Commonly known as: Duac ?Apply to face every morning for acne. ?  ?Differin 0.3 % gel ?Generic drug: Adapalene ?Apply a pea-sized amount to face and shoulders every night as tolerated. ?What changed:  ?how much to take ?how to take this ?when to take this ?reasons to take this ?additional instructions ?  ?FLUoxetine 20 MG capsule ?Commonly known as: PROZAC ?TAKE 1 CAPSULE(20 MG) BY MOUTH DAILY ?What changed: See the new instructions. ?  ?FLUoxetine 10 MG capsule ?Commonly known as: PROzac ?Take 1 capsule (10 mg total) by mouth daily. ?What changed: Another medication with the same name was changed. Make sure you understand how and when to take each. ?  ?HYDROcodone-acetaminophen 5-325 MG  tablet ?Commonly known as: NORCO/VICODIN ?Take 1-2 tablets by mouth every 6 (six) hours as needed for moderate pain. ?  ?Lubricant Eye Drops 0.4-0.3 % Soln ?Generic drug: Polyethyl Glycol-Propyl Glycol ?Place 1-2 drops into both eyes 3 (three) times daily as needed (dry/irritated eyes.). ?  ?Melatonin 10 MG Tabs ?Take 10 mg by mouth at bedtime as needed (sleep). ?  ?oxybutynin 5 MG tablet ?Commonly known as: DITROPAN ?TAKE 1  TABLET(5 MG) BY MOUTH EVERY 8 HOURS AS NEEDED FOR BLADDER SPASMS ?  ?spironolactone 100 MG tablet ?Commonly known as: ALDACTONE ?TAKE 1 TABLET(100 MG) BY MOUTH DAILY ?What changed:  ?how much to take ?how to take this ?when to take this ?additional instructions ?  ?tamsulosin 0.4 MG Caps capsule ?Commonly known as: FLOMAX ?Take 1 capsule (0.4 mg total) by mouth daily. ?  ? ?  ? ? ?Allergies: Not on File ? ?Family History: ?Family History  ?Problem Relation Age of Onset  ? Congestive Heart Failure Maternal Grandfather   ? Parkinson's disease Paternal Grandmother   ? Kidney failure Cousin   ? Kidney cancer Neg Hx   ? Prostate cancer Neg Hx   ? Breast cancer Neg Hx   ? Ovarian cancer Neg Hx   ? Colon cancer Neg Hx   ? ? ?Social History:  reports that she has never smoked. She has never used smokeless tobacco. She reports current alcohol use. She reports that she does not use drugs. ? ? ?Physical Exam: ?BP 110/72   Pulse 77   Ht '5\' 7"'$  (1.702 m)   Wt 205 lb (93 kg)   BMI 32.11 kg/m?   ?Constitutional:  Alert and oriented, No acute distress. ?HEENT: [place] AT, moist mucus membranes.  Trachea midline, no masses. ?Cardiovascular: No clubbing, cyanosis, or edema. ?Respiratory: Normal respiratory effort, no increased work of breathing. ?Skin: No rashes, bruises or suspicious lesions. ?Neurologic: Grossly intact, no focal deficits, moving all 4 extremities. ?Psychiatric: Normal mood and affect. ? ?Laboratory Data: ? ?Lab Results  ?Component Value Date  ? CREATININE 0.71 03/05/2021  ? ?Lab Results  ?Component Value Date  ? HGBA1C 5.0 07/29/2015  ? ? ?Urinalysis ?- Unremarkable  ? ?Pertinent Imaging: ?KUB was personally reviewed today and does not visualize any obvious stone burden.  ? ? ?Assessment & Plan:   ? ?1. Flank pain with history of urolithiasis ?- KUB today shows no obvious stone burden  ?- Will further evaluate with RUS to rule out any underlying hydronephrosis and obstructions. Will call her with results ?-  Urinalysis; negative  ? ?Will call her with RUS results  ? ?Conley Rolls as a scribe for [name], MD.,have documented all relevant documentation on the behalf of [name], MD,as directed by  [name], MD while in the presence of [name], MD. ? ?I have reviewed the above documentation for accuracy and completeness, and I agree with the above.  ? ?[name], MD ? ? ?[facility] ?[address], [address] ?[place], [place] [address] ?[phone] ?

## 2021-08-05 ENCOUNTER — Other Ambulatory Visit: Payer: Self-pay

## 2021-08-05 ENCOUNTER — Ambulatory Visit (INDEPENDENT_AMBULATORY_CARE_PROVIDER_SITE_OTHER): Payer: Medicaid Other | Admitting: Urology

## 2021-08-05 ENCOUNTER — Ambulatory Visit
Admission: RE | Admit: 2021-08-05 | Discharge: 2021-08-05 | Disposition: A | Discharge status: Home / Self Care | Payer: Medicaid Other | Attending: Urology | Admitting: Urology

## 2021-08-05 ENCOUNTER — Ambulatory Visit
Admission: RE | Admit: 2021-08-05 | Discharge: 2021-08-05 | Disposition: A | Discharge status: Home / Self Care | Payer: Medicaid Other | Source: Ambulatory Visit | Attending: Urology | Admitting: Urology

## 2021-08-05 VITALS — BP 110/72 | HR 77 | Ht 67.0 in | Wt 205.0 lb

## 2021-08-05 DIAGNOSIS — R109 Unspecified abdominal pain: Secondary | ICD-10-CM | POA: Insufficient documentation

## 2021-08-05 DIAGNOSIS — Z87442 Personal history of urinary calculi: Secondary | ICD-10-CM | POA: Insufficient documentation

## 2021-08-05 DIAGNOSIS — N200 Calculus of kidney: Secondary | ICD-10-CM | POA: Diagnosis not present

## 2021-08-05 LAB — URINALYSIS, COMPLETE
Bilirubin, UA: NEGATIVE
Glucose, UA: NEGATIVE
Ketones, UA: NEGATIVE
Leukocytes,UA: NEGATIVE
Nitrite, UA: NEGATIVE
Protein,UA: NEGATIVE
Specific Gravity, UA: >1.03 — ABNORMAL HIGH (ref 1.005–1.030)
Urobilinogen, Ur: 0.2 mg/dL (ref 0.2–1.0)
pH, UA: 5.5 (ref 5.0–7.5)

## 2021-08-05 LAB — MICROSCOPIC EXAMINATION

## 2021-08-14 ENCOUNTER — Ambulatory Visit
Admission: RE | Admit: 2021-08-14 | Discharge: 2021-08-14 | Disposition: A | Discharge status: Home / Self Care | Payer: Medicaid Other | Source: Ambulatory Visit | Attending: Physician Assistant | Admitting: Physician Assistant

## 2021-08-14 ENCOUNTER — Other Ambulatory Visit: Payer: Self-pay

## 2021-08-14 VITALS — BP 100/69 | HR 75 | Temp 98.4°F | Resp 18

## 2021-08-14 DIAGNOSIS — H6692 Otitis media, unspecified, left ear: Secondary | ICD-10-CM

## 2021-08-14 MED ORDER — AMOXICILLIN 500 MG PO CAPS: 500.0000 mg | ORAL_CAPSULE | Freq: Three times a day (TID) | ORAL | 0 refills | Status: DC

## 2021-08-14 NOTE — Discharge Instructions (Addendum)
Return if any problems.

## 2021-08-14 NOTE — ED Provider Notes (Signed)
?[facility] ? ? ? ?CSN: [id number] ?Arrival date & time: 08/14/21  3790 ? ? ?  ? ?History   ?Chief Complaint ?Chief Complaint  ?Patient presents with  ? Otalgia  ? ? ?HPI ?Evelyn Caldwell is a 31 y.o. female.  ? ?Patient complains of pain in her left ear patient reports she has had some congestion patient states she has had ear infections in the past she gets frequent ear infections when there is a weather change.  Patient denies any fever denies any chills denies any cough or congestion ? ?The history is provided by the patient.  ?Otalgia ? ?Past Medical History:  ?Diagnosis Date  ? Acne   ? Anxiety   ? History of kidney stones   ? Kidney stone   ? Vulvar lesion   ? ? ?Patient Active Problem List  ? Diagnosis Date Noted  ? Anxiety 08/26/2020  ? Internal hemorrhoids 08/18/2016  ? Status post vacuum-assisted vaginal delivery 03/09/2016  ? Nephrolithiasis 10/22/2015  ? Headache, migraine 01/24/2015  ? ? ?Past Surgical History:  ?Procedure Laterality Date  ? CYSTOSCOPY WITH STENT PLACEMENT Right 07/27/2018  ? Procedure: CYSTOSCOPY WITH STENT PLACEMENT;  Surgeon: Billey Co, MD;  Location: [facility];  Service: Urology;  Laterality: Right;  ? CYSTOSCOPY/URETEROSCOPY/HOLMIUM LASER/STENT PLACEMENT Right 08/03/2018  ? Procedure: CYSTOSCOPY/URETEROSCOPY/HOLMIUM LASER/STENT EXCHANGE **WITH NST BEFORE AND AFTER**;  Surgeon: [name], MD;  Location: [facility];  Service: Urology;  Laterality: Right;  ? CYSTOSCOPY/URETEROSCOPY/HOLMIUM LASER/STENT PLACEMENT Right 03/02/2021  ? Procedure: CYSTOSCOPY/URETEROSCOPY/HOLMIUM LASER/STENT PLACEMENT;  Surgeon: [name], MD;  Location: [facility];  Service: Urology;  Laterality: Right;  ? EXTRACORPOREAL SHOCK WAVE LITHOTRIPSY Left 02/15/2019  ? Procedure: EXTRACORPOREAL SHOCK WAVE LITHOTRIPSY (ESWL);  Surgeon: [name], MD;  Location: [facility];  Service: Urology;  Laterality: Left;  ? none    ? ? ?OB History   ? ? Gravida  ?2  ? Para  ?2  ? Term  ?2  ?  Preterm  ?   ? AB  ?   ? Living  ?2  ?  ? ? SAB  ?   ? IAB  ?   ? Ectopic  ?   ? Multiple  ?0  ? Live Births  ?2  ?   ?  ?  ? ? ? ?Home Medications   ? ?Prior to Admission medications   ?Medication Sig Start Date End Date Taking? Authorizing Provider  ?amoxicillin (AMOXIL) 500 MG capsule Take 1 capsule (500 mg total) by mouth 3 (three) times daily. 08/14/21  Yes Caryl Ada K, PA-C  ?acetaminophen (TYLENOL) 500 MG tablet Take 500-1,000 mg by mouth every 6 (six) hours as needed (pain). ?Patient not taking: Reported on 08/05/2021    [provider]  ?Clindamycin-Benzoyl Per, Refr, (DUAC) gel Apply to face every morning for acne. 02/03/21   Brendolyn Patty, MD  ?DIFFERIN 0.3 % gel Apply a pea-sized amount to face and shoulders every night as tolerated. ?Patient taking differently: Apply 1 application. topically at bedtime as needed (skin acne (face/shoulders)). 02/03/21   Brendolyn Patty, MD  ?FLUoxetine (PROZAC) 10 MG capsule Take 1 capsule (10 mg total) by mouth daily. ?Patient not taking: Reported on 08/05/2021 04/02/21   Philip Aspen, CNM  ?FLUoxetine (PROZAC) 20 MG capsule TAKE 1 CAPSULE(20 MG) BY MOUTH DAILY ?Patient taking differently: Take 20 mg by mouth every evening. 09/29/20   Philip Aspen, CNM  ?HYDROcodone-acetaminophen (NORCO/VICODIN) 5-325 MG tablet Take 1-2 tablets by mouth every 6 (six) hours  as needed for moderate pain. ?Patient not taking: Reported on 08/05/2021 03/06/21   [name], PA-C  ?Melatonin 10 MG TABS Take 10 mg by mouth at bedtime as needed (sleep).    [provider]  ?oxybutynin (DITROPAN) 5 MG tablet TAKE 1 TABLET(5 MG) BY MOUTH EVERY 8 HOURS AS NEEDED FOR BLADDER SPASMS ?Patient not taking: Reported on 08/05/2021 03/09/21   Bjorn Loser, MD  ?Polyethyl Glycol-Propyl Glycol (LUBRICANT EYE DROPS) 0.4-0.3 % SOLN Place 1-2 drops into both eyes 3 (three) times daily as needed (dry/irritated eyes.).    [provider]  ?spironolactone (ALDACTONE) 100 MG tablet  TAKE 1 TABLET(100 MG) BY MOUTH DAILY ?Patient taking differently: Take 100 mg by mouth every evening. 02/03/21   Brendolyn Patty, MD  ?tamsulosin (FLOMAX) 0.4 MG CAPS capsule Take 1 capsule (0.4 mg total) by mouth daily. ?Patient not taking: Reported on 08/05/2021 03/06/21   [name], PA-C  ? ? ?Family History ?Family History  ?Problem Relation Age of Onset  ? Congestive Heart Failure Maternal Grandfather   ? Parkinson's disease Paternal Grandmother   ? Kidney failure Cousin   ? Kidney cancer Neg Hx   ? Prostate cancer Neg Hx   ? Breast cancer Neg Hx   ? Ovarian cancer Neg Hx   ? Colon cancer Neg Hx   ? ? ?Social History ?Social History  ? ?Tobacco Use  ? Smoking status: Never  ? Smokeless tobacco: Never  ?Vaping Use  ? Vaping Use: Never used  ?Substance Use Topics  ? Alcohol use: Yes  ?  Comment: occasionally  ? Drug use: No  ? ? ? ?Allergies   ?Patient has no allergy information on record. ? ? ?Review of Systems ?Review of Systems  ?HENT:  Positive for ear pain.   ?All other systems reviewed and are negative. ? ? ?Physical Exam ?Triage Vital Signs ?ED Triage Vitals  ?Enc Vitals Group  ?   BP 08/14/21 0916 100/69  ?   Pulse Rate 08/14/21 0916 75  ?   Resp 08/14/21 0916 18  ?   Temp 08/14/21 0916 98.4 ?F (36.9 ?C)  ?   Temp Source 08/14/21 0916 Oral  ?   SpO2 08/14/21 0916 97 %  ?   Weight --   ?   Height --   ?   Head Circumference --   ?   Peak Flow --   ?   Pain Score 08/14/21 0920 5  ?   Pain Loc --   ?   Pain Edu? --   ?   Excl. in [place]? --   ? ?No data found. ? ?Updated Vital Signs ?BP 100/69 (BP Location: Left Arm)   Pulse 75   Temp 98.4 ?F (36.9 ?C) (Oral)   Resp 18   SpO2 97%  ? ?Visual Acuity ?Right Eye Distance:   ?Left Eye Distance:   ?Bilateral Distance:   ? ?Right Eye Near:   ?Left Eye Near:    ?Bilateral Near:    ? ?Physical Exam ?Vitals and nursing note reviewed.  ?Constitutional:   ?   General: She is not in acute distress. ?   Appearance: She is well-developed.  ?HENT:  ?   Head:  Normocephalic and atraumatic.  ?   Right Ear: Tympanic membrane normal.  ?   Ears:  ?   Comments: Bulging erythematous TM ?Eyes:  ?   Conjunctiva/sclera: Conjunctivae normal.  ?Cardiovascular:  ?   Rate and Rhythm: Normal rate and regular rhythm.  ?  Heart sounds: No murmur heard. ?Pulmonary:  ?   Effort: Pulmonary effort is normal. No respiratory distress.  ?   Breath sounds: Normal breath sounds.  ?Abdominal:  ?   Palpations: Abdomen is soft.  ?   Tenderness: There is no abdominal tenderness.  ?Musculoskeletal:     ?   General: No swelling.  ?   Cervical back: Neck supple.  ?Skin: ?   General: Skin is warm and dry.  ?   Capillary Refill: Capillary refill takes less than 2 seconds.  ?Neurological:  ?   General: No focal deficit present.  ?   Mental Status: She is alert.  ?Psychiatric:     ?   Mood and Affect: Mood normal.  ? ? ? ?UC Treatments / Results  ?Labs ?(all labs ordered are listed, but only abnormal results are displayed) ?Labs Reviewed - No data to display ? ?EKG ? ? ?Radiology ?No results found. ? ?Procedures ?Procedures (including critical care time) ? ?Medications Ordered in UC ?Medications - No data to display ? ?Initial Impression / Assessment and Plan / UC Course  ?I have reviewed the triage vital signs and the nursing notes. ? ?Pertinent labs & imaging results that were available during my care of the patient were reviewed by me and considered in my medical decision making (see chart for details). ? ?  ? ?MDM patient has a history of ear infections she currently has a red bulging TM patient is given a prescription for amoxicillin ?Final Clinical Impressions(s) / UC Diagnoses  ? ?Final diagnoses:  ?Left otitis media, unspecified otitis media type  ? ? ? ?Discharge Instructions   ? ?  ?Return if any problems.  ? ? ?ED Prescriptions   ? ? Medication Sig Dispense Auth. Provider  ? amoxicillin (AMOXIL) 500 MG capsule Take 1 capsule (500 mg total) by mouth 3 (three) times daily. 30 capsule Fransico Meadow, [place]  ? ?  ? ?PDMP not reviewed this encounter. ?An After Visit Summary was printed and given to the patient.  ?  ?Fransico Meadow, PA-C ?08/14/21 1002 ? ?

## 2021-08-14 NOTE — ED Triage Notes (Signed)
Pt here with left ear pain since last night.  ?

## 2021-08-16 ENCOUNTER — Ambulatory Visit
Admission: EM | Admit: 2021-08-16 | Discharge: 2021-08-16 | Disposition: A | Discharge status: Home / Self Care | Payer: Medicaid Other | Attending: Family Medicine | Admitting: Family Medicine

## 2021-08-16 ENCOUNTER — Other Ambulatory Visit: Payer: Self-pay

## 2021-08-16 ENCOUNTER — Encounter: Payer: Self-pay | Admitting: Emergency Medicine

## 2021-08-16 DIAGNOSIS — H6692 Otitis media, unspecified, left ear: Secondary | ICD-10-CM | POA: Diagnosis not present

## 2021-08-16 DIAGNOSIS — H7292 Unspecified perforation of tympanic membrane, left ear: Secondary | ICD-10-CM

## 2021-08-16 MED ORDER — PREDNISONE 20 MG PO TABS: 20.0000 mg | ORAL_TABLET | Freq: Every day | ORAL | 0 refills | Status: AC

## 2021-08-16 NOTE — ED Triage Notes (Signed)
Pt presents with left ear pain x 4 days. She was seen 3/17 and the pain is getting worse radiating down her jaw. ?

## 2021-08-16 NOTE — ED Provider Notes (Signed)
?[facility] ? ? ? ?CSN: [id number] ?Arrival date & time: 08/16/21  0802 ? ? ?  ? ?History   ?Chief Complaint ?Chief Complaint  ?Patient presents with  ? Otalgia  ? ? ?HPI ?Evelyn Caldwell is a 31 y.o. female.  ? ?HPI ?Patient seen 2 days ago and treated for a left ear otitis media with amoxicillin.  She presents today as she reports experiencing a sharp stabbing pain which is radiating down the lateral aspect of her left jaw.   ?She reports pain has been persistent since starting the amoxicillin.  She is also noted a clear drainage from the inner portion of her ear.  She has remained afebrile and denies any other associated symptoms ?Past Medical History:  ?Diagnosis Date  ? Acne   ? Anxiety   ? History of kidney stones   ? Kidney stone   ? Vulvar lesion   ? ? ?Patient Active Problem List  ? Diagnosis Date Noted  ? Anxiety 08/26/2020  ? Internal hemorrhoids 08/18/2016  ? Status post vacuum-assisted vaginal delivery 03/09/2016  ? Nephrolithiasis 10/22/2015  ? Headache, migraine 01/24/2015  ? ? ?Past Surgical History:  ?Procedure Laterality Date  ? CYSTOSCOPY WITH STENT PLACEMENT Right 07/27/2018  ? Procedure: CYSTOSCOPY WITH STENT PLACEMENT;  Surgeon: Billey Co, MD;  Location: [facility];  Service: Urology;  Laterality: Right;  ? CYSTOSCOPY/URETEROSCOPY/HOLMIUM LASER/STENT PLACEMENT Right 08/03/2018  ? Procedure: CYSTOSCOPY/URETEROSCOPY/HOLMIUM LASER/STENT EXCHANGE **WITH NST BEFORE AND AFTER**;  Surgeon: [name], MD;  Location: [facility];  Service: Urology;  Laterality: Right;  ? CYSTOSCOPY/URETEROSCOPY/HOLMIUM LASER/STENT PLACEMENT Right 03/02/2021  ? Procedure: CYSTOSCOPY/URETEROSCOPY/HOLMIUM LASER/STENT PLACEMENT;  Surgeon: [name], MD;  Location: [facility];  Service: Urology;  Laterality: Right;  ? EXTRACORPOREAL SHOCK WAVE LITHOTRIPSY Left 02/15/2019  ? Procedure: EXTRACORPOREAL SHOCK WAVE LITHOTRIPSY (ESWL);  Surgeon: [name], MD;  Location: [facility];  Service: Urology;   Laterality: Left;  ? none    ? ? ?OB History   ? ? Gravida  ?2  ? Para  ?2  ? Term  ?2  ? Preterm  ?   ? AB  ?   ? Living  ?2  ?  ? ? SAB  ?   ? IAB  ?   ? Ectopic  ?   ? Multiple  ?0  ? Live Births  ?2  ?   ?  ?  ? ? ? ?Home Medications   ? ?Prior to Admission medications   ?Medication Sig Start Date End Date Taking? Authorizing Provider  ?acetaminophen (TYLENOL) 500 MG tablet Take 500-1,000 mg by mouth every 6 (six) hours as needed (pain). ?Patient not taking: Reported on 08/05/2021    [provider]  ?amoxicillin (AMOXIL) 500 MG capsule Take 1 capsule (500 mg total) by mouth 3 (three) times daily. 08/14/21   Fransico Meadow, PA-C  ?Clindamycin-Benzoyl Per, Refr, (DUAC) gel Apply to face every morning for acne. 02/03/21   Brendolyn Patty, MD  ?DIFFERIN 0.3 % gel Apply a pea-sized amount to face and shoulders every night as tolerated. ?Patient taking differently: Apply 1 application. topically at bedtime as needed (skin acne (face/shoulders)). 02/03/21   Brendolyn Patty, MD  ?FLUoxetine (PROZAC) 10 MG capsule Take 1 capsule (10 mg total) by mouth daily. ?Patient not taking: Reported on 08/05/2021 04/02/21   Philip Aspen, CNM  ?FLUoxetine (PROZAC) 20 MG capsule TAKE 1 CAPSULE(20 MG) BY MOUTH DAILY ?Patient taking differently: Take 20 mg by mouth every evening. 09/29/20   Grandville Silos,  Deneise Lever, CNM  ?HYDROcodone-acetaminophen (NORCO/VICODIN) 5-325 MG tablet Take 1-2 tablets by mouth every 6 (six) hours as needed for moderate pain. ?Patient not taking: Reported on 08/05/2021 03/06/21   [name], PA-C  ?Melatonin 10 MG TABS Take 10 mg by mouth at bedtime as needed (sleep).    [provider]  ?oxybutynin (DITROPAN) 5 MG tablet TAKE 1 TABLET(5 MG) BY MOUTH EVERY 8 HOURS AS NEEDED FOR BLADDER SPASMS ?Patient not taking: Reported on 08/05/2021 03/09/21   Bjorn Loser, MD  ?Polyethyl Glycol-Propyl Glycol (LUBRICANT EYE DROPS) 0.4-0.3 % SOLN Place 1-2 drops into both eyes 3 (three) times daily as needed  (dry/irritated eyes.).    [provider]  ?spironolactone (ALDACTONE) 100 MG tablet TAKE 1 TABLET(100 MG) BY MOUTH DAILY ?Patient taking differently: Take 100 mg by mouth every evening. 02/03/21   Brendolyn Patty, MD  ?tamsulosin (FLOMAX) 0.4 MG CAPS capsule Take 1 capsule (0.4 mg total) by mouth daily. ?Patient not taking: Reported on 08/05/2021 03/06/21   [name], PA-C  ? ? ?Family History ?Family History  ?Problem Relation Age of Onset  ? Congestive Heart Failure Maternal Grandfather   ? Parkinson's disease Paternal Grandmother   ? Kidney failure Cousin   ? Kidney cancer Neg Hx   ? Prostate cancer Neg Hx   ? Breast cancer Neg Hx   ? Ovarian cancer Neg Hx   ? Colon cancer Neg Hx   ? ? ?Social History ?Social History  ? ?Tobacco Use  ? Smoking status: Never  ? Smokeless tobacco: Never  ?Vaping Use  ? Vaping Use: Never used  ?Substance Use Topics  ? Alcohol use: Yes  ?  Comment: occasionally  ? Drug use: No  ? ? ? ?Allergies   ?Patient has no known allergies. ? ? ?Review of Systems ?Review of Systems ?Pertinent negatives listed in HPI  ?Physical Exam ?Triage Vital Signs ?ED Triage Vitals  ?Enc Vitals Group  ?   BP 08/16/21 0808 108/75  ?   Pulse Rate 08/16/21 0808 89  ?   Resp 08/16/21 0808 16  ?   Temp 08/16/21 0808 98.2 ?F (36.8 ?C)  ?   Temp Source 08/16/21 0808 Oral  ?   SpO2 08/16/21 0808 98 %  ?   Weight --   ?   Height --   ?   Head Circumference --   ?   Peak Flow --   ?   Pain Score 08/16/21 0807 8  ?   Pain Loc --   ?   Pain Edu? --   ?   Excl. in [place]? --   ? ?No data found. ? ?Updated Vital Signs ?BP 108/75 (BP Location: Left Arm)   Pulse 89   Temp 98.2 ?F (36.8 ?C) (Oral)   Resp 16   SpO2 98%  ? ?Visual Acuity ?Right Eye Distance:   ?Left Eye Distance:   ?Bilateral Distance:   ? ?Right Eye Near:   ?Left Eye Near:    ?Bilateral Near:    ? ?Physical Exam ?Constitutional:   ?   Appearance: Normal appearance.  ?HENT:  ?   Right Ear: Tympanic membrane, ear canal and external ear normal.   ?   Left Ear: A middle ear effusion is present. Tympanic membrane is erythematous.  ?Cardiovascular:  ?   Rate and Rhythm: Normal rate and regular rhythm.  ?Pulmonary:  ?   Effort: Pulmonary effort is normal.  ?   Breath sounds: Normal breath sounds and air  entry.  ?Neurological:  ?   Mental Status: She is alert.  ?Psychiatric:     ?   Attention and Perception: Attention normal.     ?   Mood and Affect: Mood normal.     ?   Speech: Speech normal.  ? ? ? ?UC Treatments / Results  ?Labs ?(all labs ordered are listed, but only abnormal results are displayed) ?Labs Reviewed - No data to display ? ?EKG ? ? ?Radiology ?No results found. ? ?Procedures ?Procedures (including critical care time) ? ?Medications Ordered in UC ?Medications - No data to display ? ?Initial Impression / Assessment and Plan / UC Course  ?I have reviewed the triage vital signs and the nursing notes. ? ?Pertinent labs & imaging results that were available during my care of the patient were reviewed by me and considered in my medical decision making (see chart for details). ? ?  ?Continue and complete Amoxicillin. ?Start prednisone 20 mg once daily for 5 days for eustachian tube dysfunction likely related to TM rupture  If any of your symptoms worsen or do not improve follow-up with your PCP or ENT. ?Final Clinical Impressions(s) / UC Diagnoses  ? ?Final diagnoses:  ?Acute otitis media of left ear with perforated tympanic membrane  ? ?Discharge Instructions   ?None ?  ? ?ED Prescriptions   ? ? Medication Sig Dispense Auth. Provider  ? predniSONE (DELTASONE) 20 MG tablet Take 1 tablet (20 mg total) by mouth daily with breakfast for 5 days. 5 tablet Scot Jun, FNP  ? ?  ? ?PDMP not reviewed this encounter. ?  ?Scot Jun, FNP ?08/20/21 1717 ? ?

## 2021-08-17 ENCOUNTER — Ambulatory Visit
Admission: RE | Admit: 2021-08-17 | Discharge: 2021-08-17 | Disposition: A | Discharge status: Home / Self Care | Payer: Medicaid Other | Source: Ambulatory Visit | Attending: Urology | Admitting: Urology

## 2021-08-17 DIAGNOSIS — N200 Calculus of kidney: Secondary | ICD-10-CM | POA: Insufficient documentation

## 2021-08-25 ENCOUNTER — Ambulatory Visit: Payer: Self-pay | Admitting: Urology

## 2021-09-02 ENCOUNTER — Ambulatory Visit: Payer: Medicaid Other | Admitting: Urology

## 2021-09-09 ENCOUNTER — Encounter: Payer: Self-pay | Admitting: Certified Nurse Midwife

## 2021-09-09 ENCOUNTER — Ambulatory Visit (INDEPENDENT_AMBULATORY_CARE_PROVIDER_SITE_OTHER): Payer: Medicaid Other | Admitting: Certified Nurse Midwife

## 2021-09-09 VITALS — BP 107/71 | HR 85 | Ht 67.0 in | Wt 202.6 lb

## 2021-09-09 DIAGNOSIS — Z01419 Encounter for gynecological examination (general) (routine) without abnormal findings: Secondary | ICD-10-CM

## 2021-09-09 DIAGNOSIS — Z1159 Encounter for screening for other viral diseases: Secondary | ICD-10-CM | POA: Diagnosis not present

## 2021-09-09 DIAGNOSIS — Z113 Encounter for screening for infections with a predominantly sexual mode of transmission: Secondary | ICD-10-CM

## 2021-09-09 NOTE — Progress Notes (Signed)
? ? ?GYNECOLOGY ANNUAL PREVENTATIVE CARE ENCOUNTER NOTE ? ?History:    ? Evelyn Caldwell is a 31 y.o. [phone] female here for a routine annual gynecologic exam.  Current complaints: mood changes with IUD, is interested in having it removed.States that the strings are not visible and she has had U/s to confirm placement.    Denies abnormal vaginal bleeding, discharge, pelvic pain, problems with intercourse or other gynecologic concerns.  ?  ? ?Social ?Relationship: Married  ?Living: spouse and children ?Work: stay at home mom ?Exercise:  some walking ?Smoke/Alcohol/drug use: occasional alcohol use.  ? ?Gynecologic History ?No LMP recorded (lmp unknown). (Menstrual status: IUD). ?Contraception: IUD ?Last Pap: 06/29/19. Results were: normal  ?Last mammogram: n/a . ? ? [facility] - 09/09/21 0944   ? ?  ? Pregnancy Intention Screening  ? Does the patient want to become pregnant in the next year? No   ? Does the patient's partner want to become pregnant in the next year? No   ? Would the patient like to discuss contraceptive options today? No   ? ?  ?  ? ?  ? ?The pregnancy intention screening data noted above was reviewed. Potential methods of contraception were discussed. The patient elected to proceed with IUD. ? ? ?Obstetric History ?OB History  ?Gravida Para Term Preterm AB Living  ?'2 2 2     2  '$ ?SAB IAB Ectopic Multiple Live Births  ?      0 2  ?  ?# Outcome Date GA Lbr Len/2nd Weight Sex Delivery Anes PTL Lv  ?2 Term 10/18/18 [id number]:30 / 01:49 8 lb 12.4 oz (3.98 kg) F Vag-Spont EPI  LIV  ?1 Term 03/09/16 463w2d8 lb 1.6 oz (3.674 kg) F Vag-Spont   LIV  ? ? ?Past Medical History:  ?Diagnosis Date  ? Acne   ? Anxiety   ? History of kidney stones   ? Kidney stone   ? Vulvar lesion   ? ? ?Past Surgical History:  ?Procedure Laterality Date  ? CYSTOSCOPY WITH STENT PLACEMENT Right 07/27/2018  ? Procedure: CYSTOSCOPY WITH STENT PLACEMENT;  Surgeon: SnBilley CoMD;  Location: [facility];  Service: Urology;   Laterality: Right;  ? CYSTOSCOPY/URETEROSCOPY/HOLMIUM LASER/STENT PLACEMENT Right 08/03/2018  ? Procedure: CYSTOSCOPY/URETEROSCOPY/HOLMIUM LASER/STENT EXCHANGE **WITH NST BEFORE AND AFTER**;  Surgeon: BrHollice EspyMD;  Location: [facility];  Service: Urology;  Laterality: Right;  ? CYSTOSCOPY/URETEROSCOPY/HOLMIUM LASER/STENT PLACEMENT Right 03/02/2021  ? Procedure: CYSTOSCOPY/URETEROSCOPY/HOLMIUM LASER/STENT PLACEMENT;  Surgeon: BrHollice EspyMD;  Location: [facility];  Service: Urology;  Laterality: Right;  ? EXTRACORPOREAL SHOCK WAVE LITHOTRIPSY Left 02/15/2019  ? Procedure: EXTRACORPOREAL SHOCK WAVE LITHOTRIPSY (ESWL);  Surgeon: BrHollice EspyMD;  Location: [facility];  Service: Urology;  Laterality: Left;  ? none    ? ? ?Current Outpatient Medications on File Prior to Visit  ?Medication Sig Dispense Refill  ? amoxicillin (AMOXIL) 500 MG capsule Take 1 capsule (500 mg total) by mouth 3 (three) times daily. 30 capsule 0  ? Clindamycin-Benzoyl Per, Refr, (DUAC) gel Apply to face every morning for acne. 45 g 6  ? Melatonin 10 MG TABS Take 10 mg by mouth at bedtime as needed (sleep).    ? Polyethyl Glycol-Propyl Glycol (LUBRICANT EYE DROPS) 0.4-0.3 % SOLN Place 1-2 drops into both eyes 3 (three) times daily as needed (dry/irritated eyes.).    ? acetaminophen (TYLENOL) 500 MG tablet Take 500-1,000 mg by mouth every 6 (six) hours as needed (pain). (Patient not taking:  Reported on 08/05/2021)    ? DIFFERIN 0.3 % gel Apply a pea-sized amount to face and shoulders every night as tolerated. (Patient not taking: Reported on 09/09/2021) 45 g 6  ? FLUoxetine (PROZAC) 10 MG capsule Take 1 capsule (10 mg total) by mouth daily. (Patient not taking: Reported on 08/05/2021) 30 capsule 3  ? FLUoxetine (PROZAC) 20 MG capsule TAKE 1 CAPSULE(20 MG) BY MOUTH DAILY (Patient not taking: Reported on 09/09/2021) 30 capsule 5  ? HYDROcodone-acetaminophen (NORCO/VICODIN) 5-325 MG tablet Take 1-2 tablets by mouth every 6 (six) hours as needed for  moderate pain. (Patient not taking: Reported on 08/05/2021) 10 tablet 0  ? oxybutynin (DITROPAN) 5 MG tablet TAKE 1 TABLET(5 MG) BY MOUTH EVERY 8 HOURS AS NEEDED FOR BLADDER SPASMS (Patient not taking: Reported on 08/05/2021) 30 tablet 0  ? spironolactone (ALDACTONE) 100 MG tablet TAKE 1 TABLET(100 MG) BY MOUTH DAILY (Patient not taking: Reported on 09/09/2021) 30 tablet 11  ? tamsulosin (FLOMAX) 0.4 MG CAPS capsule Take 1 capsule (0.4 mg total) by mouth daily. (Patient not taking: Reported on 08/05/2021) 30 capsule 0  ? ?No current facility-administered medications on file prior to visit.  ? ? ?No Known Allergies ? ?Social History:  reports that she has never smoked. She has never used smokeless tobacco. She reports current alcohol use. She reports that she does not use drugs. ? ?Family History  ?Problem Relation Age of Onset  ? Congestive Heart Failure Maternal Grandfather   ? Parkinson's disease Paternal Grandmother   ? Kidney failure Cousin   ? Kidney cancer Neg Hx   ? Prostate cancer Neg Hx   ? Breast cancer Neg Hx   ? Ovarian cancer Neg Hx   ? Colon cancer Neg Hx   ? ? ?The following portions of the patient's history were reviewed and updated as appropriate: allergies, current medications, past family history, past medical history, past social history, past surgical history and problem list. ? ?Review of Systems ?Pertinent items noted in HPI and remainder of comprehensive ROS otherwise negative. ? ?Physical Exam:  ?BP 107/71   Pulse 85   Ht '5\' 7"'$  (1.702 m)   Wt 202 lb 9.6 oz (91.9 kg)   LMP  (LMP Unknown)   BMI 31.73 kg/m?  ?CONSTITUTIONAL: Well-developed, well-nourished female in no acute distress.  ?HENT:  Normocephalic, atraumatic, External right and left ear normal. Oropharynx is clear and moist ?EYES: Conjunctivae and EOM are normal. Pupils are equal, round, and reactive to light. No scleral icterus.  ?NECK: Normal range of motion, supple, no masses.  Normal thyroid.  ?SKIN: Skin is warm and dry. No rash  noted. Not diaphoretic. No erythema. No pallor. ?MUSCULOSKELETAL: Normal range of motion. No tenderness.  No cyanosis, clubbing, or edema.  2+ distal pulses. ?NEUROLOGIC: Alert and oriented to person, place, and time. Normal reflexes, muscle tone coordination.  ?PSYCHIATRIC: Normal mood and affect. Normal behavior. Normal judgment and thought content. ?CARDIOVASCULAR: Normal heart rate noted, regular rhythm ?RESPIRATORY: Clear to auscultation bilaterally. Effort and breath sounds normal, no problems with respiration noted. ?BREASTS: Symmetric in size. No masses, tenderness, skin changes, nipple drainage, or lymphadenopathy bilaterally.  ?ABDOMEN: Soft, no distention noted.  No tenderness, rebound or guarding.  ?PELVIC: Normal appearing external genitalia and urethral meatus; normal appearing vaginal mucosa and cervix.  No abnormal discharge noted.  Pap smear  not due . String finder used for IUD retrieval , unable to get strings. Normal uterine size, no other palpable masses, no uterine or adnexal  tenderness.  . ?  ?Assessment and Plan:  ?  1. Women's annual routine gynecological examination ? ?Pap: not due  ?Mammogram : n/a  ?Labs: Hep C ?Refills: none  ?Referral: none  ? ?Discussed returning for Ultrasound guided IUD removal when pt is ready.  ?Routine preventative health maintenance measures emphasized. ?Please refer to After Visit Summary for other counseling recommendations.  ?   ? ?Philip Aspen, CNM ?[facility] ?[facility]   ?

## 2021-09-09 NOTE — Patient Instructions (Signed)
Preventive Care 32-32 Years Old, Female ?Preventive care refers to lifestyle choices and visits with your health care provider that can promote health and wellness. Preventive care visits are also called wellness exams. ?What can I expect for my preventive care visit? ?Counseling ?During your preventive care visit, your health care provider may ask about your: ?Medical history, including: ?Past medical problems. ?Family medical history. ?Pregnancy history. ?Current health, including: ?Menstrual cycle. ?Method of birth control. ?Emotional well-being. ?Home life and relationship well-being. ?Sexual activity and sexual health. ?Lifestyle, including: ?Alcohol, nicotine or tobacco, and drug use. ?Access to firearms. ?Diet, exercise, and sleep habits. ?Work and work Statistician. ?Sunscreen use. ?Safety issues such as seatbelt and bike helmet use. ?Physical exam ?Your health care provider may check your: ?Height and weight. These may be used to calculate your BMI (body mass index). BMI is a measurement that tells if you are at a healthy weight. ?Waist circumference. This measures the distance around your waistline. This measurement also tells if you are at a healthy weight and may help predict your risk of certain diseases, such as type 2 diabetes and high blood pressure. ?Heart rate and blood pressure. ?Body temperature. ?Skin for abnormal spots. ?What immunizations do I need? ?Vaccines are usually given at various ages, according to a schedule. Your health care provider will recommend vaccines for you based on your age, medical history, and lifestyle or other factors, such as travel or where you work. ?What tests do I need? ?Screening ?Your health care provider may recommend screening tests for certain conditions. This may include: ?Pelvic exam and Pap test. ?Lipid and cholesterol levels. ?Diabetes screening. This is done by checking your blood sugar (glucose) after you have not eaten for a while (fasting). ?Hepatitis B  test. ?Hepatitis C test. ?HIV (human immunodeficiency virus) test. ?STI (sexually transmitted infection) testing, if you are at risk. ?BRCA-related cancer screening. This may be done if you have a family history of breast, ovarian, tubal, or peritoneal cancers. ?Talk with your health care provider about your test results, treatment options, and if necessary, the need for more tests. ?Follow these instructions at home: ?Eating and drinking ? ?Eat a healthy diet that includes fresh fruits and vegetables, whole grains, lean protein, and low-fat dairy products. ?Take vitamin and mineral supplements as recommended by your health care provider. ?Do not drink alcohol if: ?Your health care provider tells you not to drink. ?You are pregnant, may be pregnant, or are planning to become pregnant. ?If you drink alcohol: ?Limit how much you have to 0-1 drink a day. ?Know how much alcohol is in your drink. In the [place] one drink equals one 12 oz bottle of beer (355 mL), one 5 oz glass of wine (148 mL), or one 1? oz glass of hard liquor (44 mL). ?Lifestyle ?Brush your teeth every morning and night with fluoride toothpaste. Floss one time each day. ?Exercise for at least 30 minutes 5 or more days each week. ?Do not use any products that contain nicotine or tobacco. These products include cigarettes, chewing tobacco, and vaping devices, such as e-cigarettes. If you need help quitting, ask your health care provider. ?Do not use drugs. ?If you are sexually active, practice safe sex. Use a condom or other form of protection to prevent STIs. ?If you do not wish to become pregnant, use a form of birth control. If you plan to become pregnant, see your health care provider for a prepregnancy visit. ?Find healthy ways to manage stress, such as: ?Meditation, yoga,  or listening to music. ?Journaling. ?Talking to a trusted person. ?Spending time with friends and family. ?Minimize exposure to UV radiation to reduce your risk of skin  cancer. ?Safety ?Always wear your seat belt while driving or riding in a vehicle. ?Do not drive: ?If you have been drinking alcohol. Do not ride with someone who has been drinking. ?If you have been using any mind-altering substances or drugs. ?While texting. ?When you are tired or distracted. ?Wear a helmet and other protective equipment during sports activities. ?If you have firearms in your house, make sure you follow all gun safety procedures. ?Seek help if you have been physically or sexually abused. ?What's next? ?Go to your health care provider once a year for an annual wellness visit. ?Ask your health care provider how often you should have your eyes and teeth checked. ?Stay up to date on all vaccines. ?This information is not intended to replace advice given to you by your health care provider. Make sure you discuss any questions you have with your health care provider. ?Document Revised: 11/12/2020 Document Reviewed: 11/12/2020 ?Elsevier Patient Education ? [place]. ? ?

## 2021-09-10 LAB — HEPATITIS C ANTIBODY: Hep C Virus Ab: NONREACTIVE

## 2021-10-19 ENCOUNTER — Encounter: Payer: Self-pay | Admitting: Dermatology

## 2021-10-20 ENCOUNTER — Other Ambulatory Visit: Payer: Self-pay

## 2021-10-20 MED ORDER — AZELAIC ACID 15 % EX GEL: 1.0000 "application " | Freq: Two times a day (BID) | CUTANEOUS | 6 refills | Status: DC

## 2021-10-20 NOTE — Progress Notes (Signed)
Finacea gel bid

## 2021-10-25 ENCOUNTER — Other Ambulatory Visit: Payer: Self-pay | Admitting: Certified Nurse Midwife

## 2021-11-09 ENCOUNTER — Other Ambulatory Visit: Payer: Medicaid Other

## 2021-11-09 ENCOUNTER — Encounter: Payer: Medicaid Other | Admitting: Certified Nurse Midwife

## 2021-11-16 ENCOUNTER — Encounter: Payer: Self-pay | Admitting: Certified Nurse Midwife

## 2021-11-25 ENCOUNTER — Other Ambulatory Visit (INDEPENDENT_AMBULATORY_CARE_PROVIDER_SITE_OTHER): Payer: Medicaid Other

## 2021-11-25 ENCOUNTER — Other Ambulatory Visit: Payer: Self-pay | Admitting: Certified Nurse Midwife

## 2021-11-25 ENCOUNTER — Ambulatory Visit (INDEPENDENT_AMBULATORY_CARE_PROVIDER_SITE_OTHER): Payer: Medicaid Other | Admitting: Certified Nurse Midwife

## 2021-11-25 ENCOUNTER — Encounter: Payer: Self-pay | Admitting: Certified Nurse Midwife

## 2021-11-25 VITALS — BP 113/79 | HR 84 | Ht 67.0 in | Wt 208.7 lb

## 2021-11-25 DIAGNOSIS — T8332XD Displacement of intrauterine contraceptive device, subsequent encounter: Secondary | ICD-10-CM

## 2021-11-25 DIAGNOSIS — Z30432 Encounter for removal of intrauterine contraceptive device: Secondary | ICD-10-CM | POA: Diagnosis not present

## 2021-11-25 NOTE — Progress Notes (Signed)
   GYNECOLOGY OFFICE PROCEDURE NOTE  Evelyn Caldwell is a 31 y.o. [phone] here for  IUD removal. No GYN concerns.  Last pap smear was on 06/29/19 and was normal. IUD removal done with ultrasound guidance due to lost strings.   IUD Removal /Ultrasound guided.  Patient identified, informed consent performed, consent signed.  Patient was placed in the dorsal lithotomy position, Ultrasound was applied to abdomen.  Normal external genitalia was noted.  A speculum was placed in the patient's vagina, normal discharge was noted, no lesions. The cervix was visualized, no lesions, no abnormal discharge.  .  Using ultrasound guidance,  Kelly forceps were introduced into the endometrial cavity and the IUD was grasped after a couple of attempts and removed in its entirety. Patient tolerated the procedure well.    Patient will use condom for contraception.  Routine preventative health maintenance measures emphasized.   Philip Aspen, CNM

## 2021-12-15 ENCOUNTER — Emergency Department: Payer: Medicaid Other

## 2021-12-15 ENCOUNTER — Encounter: Payer: Self-pay | Admitting: Emergency Medicine

## 2021-12-15 ENCOUNTER — Emergency Department
Admission: EM | Admit: 2021-12-15 | Discharge: 2021-12-16 | Disposition: A | Discharge status: Home / Self Care | Payer: Medicaid Other | Attending: Emergency Medicine | Admitting: Emergency Medicine

## 2021-12-15 DIAGNOSIS — R072 Precordial pain: Secondary | ICD-10-CM | POA: Diagnosis present

## 2021-12-15 DIAGNOSIS — R1013 Epigastric pain: Secondary | ICD-10-CM | POA: Insufficient documentation

## 2021-12-15 DIAGNOSIS — R079 Chest pain, unspecified: Secondary | ICD-10-CM

## 2021-12-15 LAB — BASIC METABOLIC PANEL
Anion gap: 5 (ref 5–15)
BUN: 18 mg/dL (ref 6–20)
CO2: 25 mmol/L (ref 22–32)
Calcium: 8.8 mg/dL — ABNORMAL LOW (ref 8.9–10.3)
Chloride: 108 mmol/L (ref 98–111)
Creatinine, Ser: 0.71 mg/dL (ref 0.44–1.00)
GFR, Estimated: >60 mL/min (ref >60)
Glucose, Bld: 108 mg/dL — ABNORMAL HIGH (ref 70–99)
Potassium: 3.5 mmol/L (ref 3.5–5.1)
Sodium: 138 mmol/L (ref 135–145)

## 2021-12-15 LAB — CBC
HCT: 38.3 % (ref 36.0–46.0)
Hemoglobin: 12.4 g/dL (ref 12.0–15.0)
MCH: 27.3 pg (ref 26.0–34.0)
MCHC: 32.4 g/dL (ref 30.0–36.0)
MCV: 84.2 fL (ref 80.0–100.0)
Platelets: 235 10*3/uL (ref 150–400)
RBC: 4.55 MIL/uL (ref 3.87–5.11)
RDW: 13.2 % (ref 11.5–15.5)
WBC: 7.9 10*3/uL (ref 4.0–10.5)
nRBC: 0 % (ref 0.0–0.2)

## 2021-12-15 LAB — TROPONIN I (HIGH SENSITIVITY)
Troponin I (High Sensitivity): <2 ng/L (ref <18)
Troponin I (High Sensitivity): <2 ng/L (ref <18)

## 2021-12-15 LAB — POC URINE PREG, ED: Preg Test, Ur: NEGATIVE

## 2021-12-15 MED ORDER — FAMOTIDINE IN NACL 20-0.9 MG/50ML-% IV SOLN
20.0000 mg | Freq: Once | INTRAVENOUS | Status: AC
  Administered 2021-12-16: 20 mg via INTRAVENOUS
  Filled 2021-12-15: qty 50

## 2021-12-15 NOTE — ED Provider Notes (Signed)
[facility] Provider Note    Event Date/Time   First MD Initiated Contact with Patient 12/15/21 2311     (approximate)   History   Chest Pain   HPI  Evelyn Caldwell is a 31 y.o. female who presents to the ED from home with a chief complaint of chest pain.  Patient reports substernal chest discomfort which began on June 3.  Reports transient discomfort which she describes as spasms.  Did not recur for 2 weeks and then had it again.  Tonight she had it a third time and presents for evaluation.  Patient had her IUD removed on June 30.  She had already discontinued her Prozac, spironolactone entheses used for acne) and acne gel on June 3 knowing that she would have her IUD removed and wanted to stop her medicines to see if her hormones adjusted after IUD was taken out.  Did have a trip to [place] in May.  Denies fever, cough, shortness of breath, diaphoresis, palpitations, nausea/vomiting or dizziness.  Denies recent trauma.     Past Medical History   Past Medical History:  Diagnosis Date   Acne    Anxiety    History of kidney stones    Kidney stone    Vulvar lesion      Active Problem List   Patient Active Problem List   Diagnosis Date Noted   Anxiety 08/26/2020   Internal hemorrhoids 08/18/2016   Status post vacuum-assisted vaginal delivery 03/09/2016   Nephrolithiasis 10/22/2015   Headache, migraine 01/24/2015     Past Surgical History   Past Surgical History:  Procedure Laterality Date   CYSTOSCOPY WITH STENT PLACEMENT Right 07/27/2018   Procedure: CYSTOSCOPY WITH STENT PLACEMENT;  Surgeon: Billey Co, MD;  Location: [facility] ORS;  Service: Urology;  Laterality: Right;   CYSTOSCOPY/URETEROSCOPY/HOLMIUM LASER/STENT PLACEMENT Right 08/03/2018   Procedure: CYSTOSCOPY/URETEROSCOPY/HOLMIUM LASER/STENT EXCHANGE **WITH NST BEFORE AND AFTER**;  Surgeon: [name], MD;  Location: [facility] OR[facility];  Service: Urology;  Laterality: Right;    CYSTOSCOPY/URETEROSCOPY/HOLMIUM LASER/STENT PLACEMENT Right 03/02/2021   Procedure: CYSTOSCOPY/URETEROSCOPY/HOLMIUM LASER/STENT PLACEMENT;  Surgeon: [name], MD;  Location: [facility] ORS;  Service: Urology;  Laterality: Right;   EXTRACORPOREAL SHOCK WAVE LITHOTRIPSY Left 02/15/2019   Procedure: EXTRACORPOREAL SHOCK WAVE LITHOTRIPSY (ESWL);  Surgeon: [name], MD;  Location: [facility] OR[facility];  Service: Urology;  Laterality: Left;   none       Home Medications   Prior to Admission medications   Medication Sig Start Date End Date Taking? Authorizing Provider  diazepam (VALIUM) 2 MG tablet Take 1 tablet (2 mg total) by mouth every 8 (eight) hours as needed for muscle spasms. 12/16/21  Yes Paulette Blanch, MD  famotidine (PEPCID) 20 MG tablet Take 1 tablet (20 mg total) by mouth 2 (two) times daily. 12/16/21  Yes Paulette Blanch, MD  amoxicillin (AMOXIL) 500 MG capsule Take 1 capsule (500 mg total) by mouth 3 (three) times daily. Patient not taking: Reported on 12/15/2021 08/14/21   Fransico Meadow, PA-C  Azelaic Acid 15 % gel Apply 1 application. topically 2 (two) times daily. After skin is thoroughly washed and patted dry, gently but thoroughly massage a thin film of azelaic acid cream into the affected area twice daily, in the morning and evening. Patient not taking: Reported on 12/15/2021 10/20/21   Brendolyn Patty, MD  Clindamycin-Benzoyl Per, Refr, [facility]) gel Apply to face every morning for acne. Patient not taking: Reported on 12/15/2021 02/03/21   Brendolyn Patty, MD  FLUoxetine (PROZAC) 20 MG capsule TAKE 1 CAPSULE(20 MG) BY MOUTH DAILY 10/27/21   Philip Aspen, CNM  HYDROcodone-acetaminophen (NORCO/VICODIN) 5-325 MG tablet Take 1-2 tablets by mouth every 6 (six) hours as needed for moderate pain. Patient not taking: Reported on 12/15/2021 03/06/21   [name], PA-C  Melatonin 10 MG TABS Take 10 mg by mouth at bedtime as needed (sleep). Patient not taking: Reported on 12/15/2021    [provider]  Polyethyl Glycol-Propyl Glycol (LUBRICANT EYE DROPS) 0.4-0.3 % SOLN Place 1-2 drops into both eyes 3 (three) times daily as needed (dry/irritated eyes.). Patient not taking: Reported on 12/15/2021    [provider]     Allergies  Patient has no known allergies.   Family History   Family History  Problem Relation Age of Onset   Congestive Heart Failure Maternal Grandfather    Parkinson's disease Paternal Grandmother    Kidney failure Cousin    Kidney cancer Neg Hx    Prostate cancer Neg Hx    Breast cancer Neg Hx    Ovarian cancer Neg Hx    Colon cancer Neg Hx      Physical Exam  Triage Vital Signs: ED Triage Vitals  Enc Vitals Group     BP 12/15/21 2110 134/78     Pulse Rate 12/15/21 2110 85     Resp 12/15/21 2110 18     Temp 12/15/21 2110 98.4 F (36.9 C)     Temp Source 12/15/21 2110 Oral     SpO2 12/15/21 2110 100 %     Weight 12/15/21 2102 210 lb (95.3 kg)     Height 12/15/21 2102 '5\' 7"'$  (1.702 m)     Head Circumference --      Peak Flow --      Pain Score 12/15/21 2102 8     Pain Loc --      Pain Edu? --      Excl. in [place]? --     Updated Vital Signs: BP 116/66   Pulse 80   Temp 98.7 F (37.1 C) (Oral)   Resp 15   Ht '5\' 7"'$  (1.702 m)   Wt 95.3 kg   LMP 11/27/2021 (Exact Date)   SpO2 100%   BMI 32.89 kg/m    General: Awake, no distress.  CV:  RRR.  Good peripheral perfusion.  Resp:  Normal effort.  CTA B. Abd:  Mild epigastric tenderness to palpation without rebound or guarding.  No distention.  Other:  Bilateral calves are nontender and nonswollen.   ED Results / Procedures / Treatments  Labs (all labs ordered are listed, but only abnormal results are displayed) Labs Reviewed  BASIC METABOLIC PANEL - Abnormal; Notable for the following components:      Result Value   Glucose, Bld 108 (*)    Calcium 8.8 (*)    All other components within normal limits  D-DIMER, QUANTITATIVE - Abnormal; Notable for the following  components:   D-Dimer, Quant 0.54 (*)    All other components within normal limits  CBC  HEPATIC FUNCTION PANEL  LIPASE, BLOOD  POC URINE PREG, ED  TROPONIN I (HIGH SENSITIVITY)  TROPONIN I (HIGH SENSITIVITY)     EKG  ED ECG REPORT I, Aseel Truxillo J, the attending physician, personally viewed and interpreted this ECG.   Date: 12/15/2021  EKG Time: 2104  Rate: 83  Rhythm: normal sinus rhythm  Axis: Normal  Intervals:none  ST&T Change: Nonspecific    RADIOLOGY I have independently  visualized and interpreted patient's chest x-ray as well as noted the radiology interpretation:  Chest x-ray: No acute cardiopulmonary process  [place]: Negative  CTA Chest: No PE  Official radiology report(s): CT Angio Chest PE W/Cm &/Or Wo Cm  Result Date: 12/16/2021 CLINICAL DATA:  Pulmonary embolism (PE) suspected, positive D-dimer Midsternal chest pain. EXAM: CT ANGIOGRAPHY CHEST WITH CONTRAST TECHNIQUE: Multidetector CT imaging of the chest was performed using the standard protocol during bolus administration of intravenous contrast. Multiplanar CT image reconstructions and MIPs were obtained to evaluate the vascular anatomy. RADIATION DOSE REDUCTION: This exam was performed according to the departmental dose-optimization program which includes automated exposure control, adjustment of the mA and/or kV according to patient size and/or use of iterative reconstruction technique. CONTRAST:  49m OMNIPAQUE IOHEXOL 350 MG/ML SOLN COMPARISON:  Radiograph yesterday. FINDINGS: Cardiovascular: There are no filling defects within the pulmonary arteries to suggest pulmonary embolus. Normal thoracic aorta without dissection or acute findings. The heart is normal in size. No pericardial effusion. Mediastinum/Nodes: No enlarged mediastinal, hilar, or axillary lymph nodes. No thyroid nodule. Decompressed esophagus. Lungs/Pleura: Mild central bronchial thickening. No focal airspace disease. No pleural fluid. No pulmonary  edema. The trachea and central bronchi are patent. Upper Abdomen: No acute or unexpected findings. Musculoskeletal: There are no acute or suspicious osseous abnormalities. Review of the MIP images confirms the above findings. IMPRESSION: 1. No pulmonary embolus. 2. Mild central bronchial thickening, can be seen with bronchitis or reactive airways disease. Electronically Signed   By: MKeith RakeM.D.   On: 12/16/2021 01:12   [place] LIMITED RUQ (LIVER/GB)  Result Date: 12/16/2021 CLINICAL DATA:  Epigastric pain. EXAM: ULTRASOUND ABDOMEN LIMITED RIGHT UPPER QUADRANT COMPARISON:  CT 04/15/2019 FINDINGS: Gallbladder: Only minimally distended, wall thickness of 4 mm is not unexpected given degree of distension. No gallstones. No pericholecystic fluid. No sonographic Murphy sign noted by sonographer. Common bile duct: Diameter: 4 mm. Liver: No focal lesion identified. Borderline increased in parenchymal echogenicity. Portal vein is patent on color Doppler imaging with normal direction of blood flow towards the liver. Other: No right upper quadrant ascites. IMPRESSION: 1. Contracted gallbladder. No gallstones or findings of acute cholecystitis. 2. Borderline increased hepatic echogenicity suggesting hepatic steatosis. Electronically Signed   By: MKeith RakeM.D.   On: 12/16/2021 00:35   DG Chest 2 View  Result Date: 12/15/2021 CLINICAL DATA:  Chest pain. EXAM: CHEST - 2 VIEW COMPARISON:  10/26/2018 FINDINGS: The cardiomediastinal contours are normal. The lungs are clear. Pulmonary vasculature is normal. No consolidation, pleural effusion, or pneumothorax. No acute osseous abnormalities are seen. IMPRESSION: Negative radiographs of the chest. Electronically Signed   By: MKeith RakeM.D.   On: 12/15/2021 21:25     PROCEDURES:  Critical Care performed: No  .1-3 Lead EKG Interpretation  Performed by: SPaulette Blanch MD Authorized by: SPaulette Blanch MD     Interpretation: normal     ECG rate:   70   ECG rate assessment: normal     Rhythm: sinus rhythm     Ectopy: none     Conduction: normal   Comments:     Patient placed on cardiac monitor to evaluate for arrthymias    MEDICATIONS ORDERED IN ED: Medications  famotidine (PEPCID) IVPB 20 mg premix (0 mg Intravenous Stopped 12/16/21 0030)  sodium chloride 0.9 % bolus 1,000 mL (0 mLs Intravenous Stopped 12/16/21 0159)  iohexol (OMNIPAQUE) 350 MG/ML injection 75 mL (75 mLs Intravenous Contrast Given 12/16/21 0053)  dicyclomine (  BENTYL) tablet 20 mg (20 mg Oral Given 12/16/21 0216)     IMPRESSION / MDM / ASSESSMENT AND PLAN / ED COURSE  I reviewed the triage vital signs and the nursing notes.                             31 year old female presenting with a 6-week history of chest pain. Differential diagnosis includes, but is not limited to, ACS, aortic dissection, pulmonary embolism, cardiac tamponade, pneumothorax, pneumonia, pericarditis, myocarditis, GI-related causes including esophagitis/gastritis, and musculoskeletal chest wall pain.   I have personally reviewed patient's chart and note her GYN office visit on 11/25/2021 for IUD removal.  Patient's presentation is most consistent with acute complicated illness / injury requiring diagnostic workup.  The patient is on the cardiac monitor to evaluate for evidence of arrhythmia and/or significant heart rate changes.  Laboratory results demonstrate normal WBC 7.9, normal electrolytes, 2 sets of troponins negative; chest x-ray unremarkable.  Will check LFTs/lipase and D-dimer.  Possible esophageal spasms, Coley lithiasis.  We will also obtain right upper quadrant abdominal ultrasound.  Administer IV Pepcid and reassess.  Clinical Course as of 12/16/21 0539  Wed Dec 16, 2021  0043 Updated patient on negative ultrasound and liver enzymes.  D-dimer is slightly elevated; will obtain CTA chest to evaluate for PE.  Patient voices no complaints at this time. [JS]  0133 Updated patient on  CTA demonstrating no PE.  Will refer to both cardiology and GI for outpatient follow-up.  We will start Pepcid twice daily in the interim.  Strict return precautions given.  Patient verbalizes understanding and agrees with plan of care. [JS]    Clinical Course User Index [JS] Paulette Blanch, MD     FINAL CLINICAL IMPRESSION(S) / ED DIAGNOSES   Final diagnoses:  Nonspecific chest pain  Epigastric pain     Rx / DC Orders   ED Discharge Orders          Ordered    Ambulatory referral to Cardiology       Comments: If you have not heard from the Cardiology office within the next 72 hours please call [phone].   12/16/21 0139    famotidine (PEPCID) 20 MG tablet  2 times daily        12/16/21 0139    diazepam (VALIUM) 2 MG tablet  Every 8 hours PRN        12/16/21 0144             Note:  This document was prepared using Dragon voice recognition software and may include unintentional dictation errors.   Paulette Blanch, MD 12/16/21 [phone]

## 2021-12-15 NOTE — ED Triage Notes (Signed)
Pt presents via POV with complaints of mid-sternal CP that has occurred intermittently over the last 6 weeks. Pt describes the pain as a "spasm/tightness". Of note, the patient had her IUD removed on 6/30 - since that time she has not been taking any of her prescribed medications. Denies SOB.

## 2021-12-16 ENCOUNTER — Emergency Department: Payer: Medicaid Other

## 2021-12-16 LAB — HEPATIC FUNCTION PANEL
ALT: 17 U/L (ref 0–44)
AST: 17 U/L (ref 15–41)
Albumin: 3.7 g/dL (ref 3.5–5.0)
Alkaline Phosphatase: 79 U/L (ref 38–126)
Bilirubin, Direct: <0.1 mg/dL (ref 0.0–0.2)
Total Bilirubin: 0.3 mg/dL (ref 0.3–1.2)
Total Protein: 7.4 g/dL (ref 6.5–8.1)

## 2021-12-16 LAB — LIPASE, BLOOD: Lipase: 34 U/L (ref 11–51)

## 2021-12-16 LAB — D-DIMER, QUANTITATIVE: D-Dimer, Quant: 0.54 ug/mL-FEU — ABNORMAL HIGH (ref 0.00–0.50)

## 2021-12-16 MED ORDER — DIAZEPAM 2 MG PO TABS: 2.0000 mg | ORAL_TABLET | Freq: Once | ORAL | Status: DC

## 2021-12-16 MED ORDER — SODIUM CHLORIDE 0.9 % IV BOLUS
1000.0000 mL | Freq: Once | INTRAVENOUS | Status: AC
  Administered 2021-12-16: 1000 mL via INTRAVENOUS

## 2021-12-16 MED ORDER — IOHEXOL 350 MG/ML SOLN
75.0000 mL | Freq: Once | INTRAVENOUS | Status: AC | PRN
  Administered 2021-12-16: 75 mL via INTRAVENOUS

## 2021-12-16 MED ORDER — DICYCLOMINE HCL 20 MG PO TABS
20.0000 mg | ORAL_TABLET | Freq: Once | ORAL | Status: AC
  Administered 2021-12-16: 20 mg via ORAL
  Filled 2021-12-16: qty 1

## 2021-12-16 MED ORDER — DIAZEPAM 2 MG PO TABS: 2.0000 mg | ORAL_TABLET | Freq: Three times a day (TID) | ORAL | 0 refills | Status: DC | PRN

## 2021-12-16 MED ORDER — FAMOTIDINE 20 MG PO TABS: 20.0000 mg | ORAL_TABLET | Freq: Two times a day (BID) | ORAL | 0 refills | Status: DC

## 2021-12-16 NOTE — ED Notes (Signed)
US at bedside

## 2021-12-16 NOTE — Discharge Instructions (Addendum)
Start Pepcid 20 mg twice daily.  You may give Valium as needed for discomfort/spasms.  Return to the ER for worsening symptoms, persistent vomiting, difficulty breathing or other concerns.

## 2021-12-24 ENCOUNTER — Encounter: Payer: Self-pay | Admitting: Internal Medicine

## 2021-12-24 ENCOUNTER — Ambulatory Visit (INDEPENDENT_AMBULATORY_CARE_PROVIDER_SITE_OTHER): Payer: Medicaid Other | Admitting: Internal Medicine

## 2021-12-24 VITALS — BP 104/72 | HR 74 | Ht 67.0 in | Wt 207.0 lb

## 2021-12-24 DIAGNOSIS — R079 Chest pain, unspecified: Secondary | ICD-10-CM | POA: Diagnosis not present

## 2021-12-24 NOTE — Progress Notes (Signed)
Cardiology Office Note:    Date:  12/24/2021   ID:  Evelyn Caldwell, DOB July 28, 1990, MRN [id number]  PCP:  Kathyrn Lass   [facility] Providers Cardiologist:  Janina Mayo, MD     Referring MD: Paulette Blanch, MD   No chief complaint on file. Chest pain  History of Present Illness:    Evelyn Caldwell is a 31 y.o. female with a hx of anxiety, presented to the ED with chest pain. She felt a spasm. She noted it lasted an hour. This happened 3 times. It went away on its own. This occurred intermittently. Her stepfather is in hospice and she is very stressed with this. She can do her daily activities without limitations. No family hx of premature CAD. She has normal blood pressure  Past Medical History:  Diagnosis Date   Acne    Anxiety    History of kidney stones    Kidney stone    Vulvar lesion     Past Surgical History:  Procedure Laterality Date   CYSTOSCOPY WITH STENT PLACEMENT Right 07/27/2018   Procedure: CYSTOSCOPY WITH STENT PLACEMENT;  Surgeon: Billey Co, MD;  Location: [facility];  Service: Urology;  Laterality: Right;   CYSTOSCOPY/URETEROSCOPY/HOLMIUM LASER/STENT PLACEMENT Right 08/03/2018   Procedure: CYSTOSCOPY/URETEROSCOPY/HOLMIUM LASER/STENT EXCHANGE **WITH NST BEFORE AND AFTER**;  Surgeon: [name], MD;  Location: [facility];  Service: Urology;  Laterality: Right;   CYSTOSCOPY/URETEROSCOPY/HOLMIUM LASER/STENT PLACEMENT Right 03/02/2021   Procedure: CYSTOSCOPY/URETEROSCOPY/HOLMIUM LASER/STENT PLACEMENT;  Surgeon: [name], MD;  Location: [facility];  Service: Urology;  Laterality: Right;   EXTRACORPOREAL SHOCK WAVE LITHOTRIPSY Left 02/15/2019   Procedure: EXTRACORPOREAL SHOCK WAVE LITHOTRIPSY (ESWL);  Surgeon: [name], MD;  Location: [facility];  Service: Urology;  Laterality: Left;   none      Current Medications: Current Outpatient Medications on File Prior to Visit  Medication Sig Dispense Refill   diazepam (VALIUM) 2 MG  tablet Take 1 tablet (2 mg total) by mouth every 8 (eight) hours as needed for muscle spasms. 15 tablet 0   famotidine (PEPCID) 20 MG tablet Take 1 tablet (20 mg total) by mouth 2 (two) times daily. 60 tablet 0   FLUoxetine (PROZAC) 20 MG capsule TAKE 1 CAPSULE(20 MG) BY MOUTH DAILY 30 capsule 5   No current facility-administered medications on file prior to visit.     Allergies:   Patient has no known allergies.   Social History   Socioeconomic History   Marital status: Married    Spouse name: Not on file   Number of children: Not on file   Years of education: Not on file   Highest education level: Not on file  Occupational History   Not on file  Tobacco Use   Smoking status: Never   Smokeless tobacco: Never  Vaping Use   Vaping Use: Never used  Substance and Sexual Activity   Alcohol use: Yes    Comment: occasionally   Drug use: No   Sexual activity: Yes    Birth control/protection: I.U.D.  Other Topics Concern   Not on file  Social History Narrative   Not on file   Social Determinants of Health   Financial Resource Strain: Low Risk  (10/18/2018)   Overall Financial Resource Strain (CARDIA)    Difficulty of Paying Living Expenses: Not hard at all  Food Insecurity: Unknown (10/18/2018)   Hunger Vital Sign    Worried About Running Out of Food in the Last Year: Patient refused  Ran Out of Food in the Last Year: Patient refused  Transportation Needs: Unknown (10/18/2018)   PRAPARE - Transportation    Lack of Transportation (Medical): Patient refused    Lack of Transportation (Non-Medical): Patient refused  Physical Activity: Unknown (10/18/2018)   Exercise Vital Sign    Days of Exercise per Week: Patient refused    Minutes of Exercise per Session: Patient refused  Stress: Not on file  Social Connections: Unknown (10/18/2018)   Social Connection and Isolation Panel [NHANES]    Frequency of Communication with Friends and Family: Patient refused    Frequency of Social  Gatherings with Friends and Family: Patient refused    Attends Religious Services: Patient refused    Marine scientist or Organizations: Patient refused    Attends Archivist Meetings: Patient refused    Marital Status: Patient refused     Family History: The patient's family history includes Congestive Heart Failure in her maternal grandfather; Kidney failure in her cousin; Parkinson's disease in her paternal grandmother. There is no history of Kidney cancer, Prostate cancer, Breast cancer, Ovarian cancer, or Colon cancer.  ROS:   Please see the history of present illness.     All other systems reviewed and are negative.  EKGs/Labs/Other Studies Reviewed:    The following studies were reviewed today:   EKG:  EKG is  ordered today.  The ekg ordered today demonstrates   7/27-NSR  Recent Labs: 04/02/2021: TSH 1.580 12/15/2021: ALT 17; BUN 18; Creatinine, Ser 0.71; Hemoglobin 12.4; Platelets 235; Potassium 3.5; Sodium 138   Recent Lipid Panel    Component Value Date/Time   CHOL 142 06/29/2019 1439   TRIG 140 06/29/2019 1439   HDL 48 06/29/2019 1439   CHOLHDL 3.0 06/29/2019 1439   LDLCALC 70 06/29/2019 1439     Risk Assessment/Calculations:           Physical Exam:    VS:    Vitals:   12/24/21 0929  BP: 104/72  Pulse: 74  SpO2: 98%     BP 104/72   Pulse 74   Ht '5\' 7"'$  (1.702 m)   Wt 207 lb (93.9 kg)   LMP 11/27/2021 (Exact Date)   SpO2 98%   BMI 32.42 kg/m     Wt Readings from Last 3 Encounters:  12/24/21 207 lb (93.9 kg)  12/15/21 210 lb (95.3 kg)  11/25/21 208 lb 11.2 oz (94.7 kg)     GEN:  Well nourished, well developed in no acute distress HEENT: Normal NECK: No JVD;  LYMPHATICS: No lymphadenopathy CARDIAC: RRR, no murmurs, rubs, gallops RESPIRATORY:  Clear to auscultation without rales, wheezing or rhonchi  ABDOMEN: Soft, non-tender, non-distended MUSCULOSKELETAL:  No edema; No deformity  SKIN: Warm and dry NEUROLOGIC:   Alert and oriented x 3 PSYCHIATRIC:  Normal affect   ASSESSMENT:   Atypical CP:  The nature of the pain is very atypical. Her EKG today was normal. Her ASCVD risk is low. Consider the low pretest probability and atypical pain will not recommend further testing. We discussed grief is contributing to her symptoms and to seek support.  PLAN:    In order of problems listed above:  No cardiac work up is warranted at this time        Medication Adjustments/Labs and Tests Ordered: Current medicines are reviewed at length with the patient today.  Concerns regarding medicines are outlined above.  Orders Placed This Encounter  Procedures   EKG 12-Lead   No orders  of the defined types were placed in this encounter.   Patient Instructions  Medication Instructions:  No Changes In Medications at this time.  *If you need a refill on your cardiac medications before your next appointment, please call your pharmacy*  Lab Work: None Ordered At This Time.  If you have labs (blood work) drawn today and your tests are completely normal, you will receive your results only by: [place] (if you have MyChart) OR A paper copy in the mail If you have any lab test that is abnormal or we need to change your treatment, we will call you to review the results.  Testing/Procedures: None Ordered At This Time.   Follow-Up: At [facility], you and your health needs are our priority.  As part of our continuing mission to provide you with exceptional heart care, we have created designated Provider Care Teams.  These Care Teams include your primary Cardiologist (physician) and Advanced Practice Providers (APPs -  Physician Assistants and Nurse Practitioners) who all work together to provide you with the care you need, when you need it.  Your next appointment:   AS NEEDED   The format for your next appointment:   In Person  Provider:   Janina Mayo, MD            Signed, Janina Mayo,  MD  12/24/2021 9:46 AM    [place]

## 2021-12-24 NOTE — Patient Instructions (Signed)

## 2022-01-06 ENCOUNTER — Ambulatory Visit (INDEPENDENT_AMBULATORY_CARE_PROVIDER_SITE_OTHER): Payer: Medicaid Other | Admitting: Gastroenterology

## 2022-01-06 ENCOUNTER — Encounter: Payer: Self-pay | Admitting: Gastroenterology

## 2022-01-06 VITALS — BP 117/79 | HR 80 | Temp 98.4°F | Ht 67.0 in | Wt 207.2 lb

## 2022-01-06 DIAGNOSIS — R0789 Other chest pain: Secondary | ICD-10-CM | POA: Diagnosis not present

## 2022-01-06 DIAGNOSIS — K30 Functional dyspepsia: Secondary | ICD-10-CM

## 2022-01-06 NOTE — Patient Instructions (Signed)
Fatty Liver Disease  The liver converts food into energy, removes toxic material from the blood, makes important proteins, and absorbs necessary vitamins from food. Fatty liver disease occurs when too much fat has built up in your liver cells. Fatty liver disease is also called hepatic steatosis. In many cases, fatty liver disease does not cause symptoms or problems. It is often diagnosed when tests are being done for other reasons. However, over time, fatty liver can cause inflammation that may lead to more serious liver problems, such as scarring of the liver (cirrhosis) and liver failure. Fatty liver is associated with insulin resistance, increased body fat, high blood pressure (hypertension), and high cholesterol. These are features of metabolic syndrome and increase your risk for stroke, diabetes, and heart disease. What are the causes? This condition may be caused by components of metabolic syndrome: Obesity. Insulin resistance. High cholesterol. Other causes: Alcohol abuse. Poor nutrition. Cushing syndrome. Pregnancy. Certain drugs. Poisons. Some viral infections. What increases the risk? You are more likely to develop this condition if you: Abuse alcohol. Are overweight. Have diabetes. Have hepatitis. Have a high triglyceride level. Are pregnant. What are the signs or symptoms? Fatty liver disease often does not cause symptoms. If symptoms do develop, they can include: Fatigue and weakness. Weight loss. Confusion. Nausea, vomiting, or abdominal pain. Yellowing of your skin and the white parts of your eyes (jaundice). Itchy skin. How is this diagnosed? This condition may be diagnosed by: A physical exam and your medical history. Blood tests. Imaging tests, such as an ultrasound, CT scan, or MRI. A liver biopsy. A small sample of liver tissue is removed using a needle. The sample is then looked at under a microscope. How is this treated? Fatty liver disease is often  caused by other health conditions. Treatment for fatty liver may involve medicines and lifestyle changes to manage conditions such as: Alcoholism. High cholesterol. Diabetes. Being overweight or obese. Follow these instructions at home:  Do not drink alcohol. If you have trouble quitting, ask your health care provider how to safely quit with the help of medicine or a supervised program. This is important to keep your condition from getting worse. Eat a healthy diet as told by your health care provider. Ask your health care provider about working with a dietitian to develop an eating plan. Exercise regularly. This can help you lose weight and control your cholesterol and diabetes. Talk to your health care provider about an exercise plan and which activities are best for you. Take over-the-counter and prescription medicines only as told by your health care provider. Keep all follow-up visits. This is important. Contact a health care provider if: You have trouble controlling your: Blood sugar. This is especially important if you have diabetes. Cholesterol. Drinking of alcohol. Get help right away if: You have abdominal pain. You have jaundice. You have nausea and are vomiting. You vomit blood or material that looks like coffee grounds. You have stools that are black, tar-like, or bloody. Summary Fatty liver disease develops when too much fat builds up in the cells of your liver. Fatty liver disease often causes no symptoms or problems. However, over time, fatty liver can cause inflammation that may lead to more serious liver problems, such as scarring of the liver (cirrhosis). You are more likely to develop this condition if you abuse alcohol, are pregnant, are overweight, have diabetes, have hepatitis, or have high triglyceride or cholesterol levels. Contact your health care provider if you have trouble controlling your blood   sugar, cholesterol, or drinking of alcohol. This information is  not intended to replace advice given to you by your health care provider. Make sure you discuss any questions you have with your health care provider. Document Revised: 02/28/2020 Document Reviewed: 02/28/2020 Elsevier Patient Education  2023 Elsevier Inc.  

## 2022-01-06 NOTE — Progress Notes (Signed)
Cephas Darby, MD [address]  [place]  [place], [place] [address]  Main: [phone]  Fax: [phone]    Gastroenterology Consultation  Referring Provider:     No ref. provider found Primary Care Physician:  Pcp, No Primary Gastroenterologist:  Dr. Cephas Darby Reason for Consultation: Epigastric discomfort        HPI:   Evelyn Caldwell is a 31 y.o. female referred by Dr. Merryl Hacker, No  for consultation & management of epigastric discomfort.  Patient went to the ER on 7/18 secondary to midsternal chest pain, upper abdominal discomfort, indigestion after a meal.  These episodes are more frequent within last 2 months when her stepfather was in hospice.  She was going to a lot of stress.  Cardiac etiology was ruled out in the ER, she underwent right upper quadrant ultrasound which revealed contracted gallbladder, no evidence of gallstones, she underwent CT angio chest PE protocol which was negative.  She was also evaluated by cardiology on 12/24/2021 and chest pain was deemed to be noncardiac etiology and did not recommend any further workup.  Patient reports that her stepfather passed away few days ago.  She reports that the chest pain did not recur.  She reports that the episodes of indigestion have become less frequent as well.  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  She does admit to drinking sodas daily.  She has gained 4-5 pounds within the last 3 to 4 months.  Her labs were otherwise unrevealing.  Patient was given Pepcid in the ER.  She is currently not taking any acid reflux medication.  Patient does not smoke or drink alcohol  NSAIDs: None  Antiplts/Anticoagulants/Anti thrombotics: None  GI Procedures: None  Past Medical History:  Diagnosis Date   Acne    Anxiety    History of kidney stones    Kidney stone    Vulvar lesion     Past Surgical History:  Procedure Laterality Date   CYSTOSCOPY WITH STENT PLACEMENT Right  07/27/2018   Procedure: CYSTOSCOPY WITH STENT PLACEMENT;  Surgeon: Billey Co, MD;  Location: [facility];  Service: Urology;  Laterality: Right;   CYSTOSCOPY/URETEROSCOPY/HOLMIUM LASER/STENT PLACEMENT Right 08/03/2018   Procedure: CYSTOSCOPY/URETEROSCOPY/HOLMIUM LASER/STENT EXCHANGE **WITH NST BEFORE AND AFTER**;  Surgeon: [name], MD;  Location: [facility];  Service: Urology;  Laterality: Right;   CYSTOSCOPY/URETEROSCOPY/HOLMIUM LASER/STENT PLACEMENT Right 03/02/2021   Procedure: CYSTOSCOPY/URETEROSCOPY/HOLMIUM LASER/STENT PLACEMENT;  Surgeon: [name], MD;  Location: [facility];  Service: Urology;  Laterality: Right;   EXTRACORPOREAL SHOCK WAVE LITHOTRIPSY Left 02/15/2019   Procedure: EXTRACORPOREAL SHOCK WAVE LITHOTRIPSY (ESWL);  Surgeon: [name], MD;  Location: [facility];  Service: Urology;  Laterality: Left;   none       Current Outpatient Medications:    diazepam (VALIUM) 2 MG tablet, Take 1 tablet (2 mg total) by mouth every 8 (eight) hours as needed for muscle spasms., Disp: 15 tablet, Rfl: 0   famotidine (PEPCID) 20 MG tablet, Take 1 tablet (20 mg total) by mouth 2 (two) times daily., Disp: 60 tablet, Rfl: 0   spironolactone (ALDACTONE) 100 MG tablet, Take 100 mg by mouth daily., Disp: , Rfl:    Family History  Problem Relation Age of Onset   Congestive Heart Failure Maternal Grandfather    Parkinson's disease Paternal Grandmother    Kidney failure Cousin    Kidney cancer Neg Hx    Prostate cancer Neg Hx  Breast cancer Neg Hx    Ovarian cancer Neg Hx    Colon cancer Neg Hx      Social History   Tobacco Use   Smoking status: Never   Smokeless tobacco: Never  Vaping Use   Vaping Use: Never used  Substance Use Topics   Alcohol use: Yes    Comment: occasionally   Drug use: No    Allergies as of 01/06/2022   (No Known Allergies)    Review of Systems:    All systems reviewed and negative except where noted in HPI.   Physical Exam:  BP 117/79  (BP Location: Left Arm, Patient Position: Sitting, Cuff Size: Normal)   Pulse 80   Temp 98.4 F (36.9 C) (Oral)   Ht '5\' 7"'$  (1.702 m)   Wt 207 lb 4 oz (94 kg)   LMP 11/27/2021 (Exact Date)   BMI 32.46 kg/m  Patient's last menstrual period was 11/27/2021 (exact date).  General:   Alert,  Well-developed, well-nourished, pleasant and cooperative in NAD Head:  Normocephalic and atraumatic. Eyes:  Sclera clear, no icterus.   Conjunctiva pink. Ears:  Normal auditory acuity. Nose:  No deformity, discharge, or lesions. Mouth:  No deformity or lesions,oropharynx pink & moist. Neck:  Supple; no masses or thyromegaly. Lungs:  Respirations even and unlabored.  Clear throughout to auscultation.   No wheezes, crackles, or rhonchi. No acute distress. Heart:  Regular rate and rhythm; no murmurs, clicks, rubs, or gallops. Abdomen:  Normal bowel sounds. Soft, non-tender and non-distended without masses, hepatosplenomegaly or hernias noted.  No guarding or rebound tenderness.   Rectal: Not performed Msk:  Symmetrical without gross deformities. Good, equal movement & strength bilaterally. Pulses:  Normal pulses noted. Extremities:  No clubbing or edema.  No cyanosis. Neurologic:  Alert and oriented x3;  grossly normal neurologically. Skin:  Intact without significant lesions or rashes. No jaundice. Psych:  Alert and cooperative. Normal mood and affect.  Imaging Studies: Reviewed  Assessment and Plan:   Evelyn Caldwell is a 31 y.o. pleasant Caucasian female with BMI 32, is seen in consultation for 1 month history of atypical chest pain, indigestion.  Her chest pain has currently resolved, symptoms of indigestion are occasional.  Right upper quadrant ultrasound revealed contracted gallbladder with no evidence of gallstones.  She does have mild fatty liver.  Today, I have discussed regarding healthy lifestyle for management of fatty liver disease.  I did recommend to hold off in any further workup  at this time unless her symptoms return.  She will reach out to me when her symptoms return.  I have also advised her an empiric trial of over-the-counter PPI for 2 weeks if her symptoms return before contacting our office.  Patient expressed understanding of the plan and felt reasonable.  Next steps would be performing an EGD +/- HIDA scan if her symptoms return   Follow up as needed   Cephas Darby, MD

## 2022-02-03 ENCOUNTER — Other Ambulatory Visit: Payer: Self-pay | Admitting: Dermatology

## 2022-02-09 ENCOUNTER — Ambulatory Visit (INDEPENDENT_AMBULATORY_CARE_PROVIDER_SITE_OTHER): Payer: Medicaid Other | Admitting: Dermatology

## 2022-02-09 DIAGNOSIS — D229 Melanocytic nevi, unspecified: Secondary | ICD-10-CM

## 2022-02-09 DIAGNOSIS — L905 Scar conditions and fibrosis of skin: Secondary | ICD-10-CM | POA: Diagnosis not present

## 2022-02-09 DIAGNOSIS — Z1283 Encounter for screening for malignant neoplasm of skin: Secondary | ICD-10-CM | POA: Diagnosis not present

## 2022-02-09 DIAGNOSIS — L700 Acne vulgaris: Secondary | ICD-10-CM

## 2022-02-09 DIAGNOSIS — L578 Other skin changes due to chronic exposure to nonionizing radiation: Secondary | ICD-10-CM

## 2022-02-09 DIAGNOSIS — D2271 Melanocytic nevi of right lower limb, including hip: Secondary | ICD-10-CM | POA: Diagnosis not present

## 2022-02-09 DIAGNOSIS — L814 Other melanin hyperpigmentation: Secondary | ICD-10-CM

## 2022-02-09 MED ORDER — SPIRONOLACTONE 100 MG PO TABS: ORAL_TABLET | ORAL | 0 refills | Status: DC

## 2022-02-09 MED ORDER — DIFFERIN 0.3 % EX GEL: CUTANEOUS | 3 refills | Status: DC

## 2022-02-09 NOTE — Progress Notes (Signed)
Follow-Up Visit   Subjective  Evelyn Caldwell is a 31 y.o. female who presents for the following: Annual Exam.  The patient presents for Total-Body Skin Exam (TBSE) for skin cancer screening and mole check.  The patient has spots, moles and lesions to be evaluated, some may be new or changing. Acne is improved with Spironolactone '100mg'$  daily and Differin 0.3% gel qhs.    The following portions of the chart were reviewed this encounter and updated as appropriate:       Review of Systems:  No other skin or systemic complaints except as noted in HPI or Assessment and Plan.  Objective  Well appearing patient in no apparent distress; mood and affect are within normal limits.  A full examination was performed including scalp, head, eyes, ears, nose, lips, neck, chest, axillae, abdomen, back, buttocks, bilateral upper extremities, bilateral lower extremities, hands, feet, fingers, toes, fingernails, and toenails. All findings within normal limits unless otherwise noted below.  Right pretibia 6.0 x 4.33m medium light brown macule  face Resolving inflammatory papules on the chin, neck, chest  Right Wrist Dyspigmented scar    Assessment & Plan  Skin cancer screening performed today.  Actinic Damage - chronic, secondary to cumulative UV radiation exposure/sun exposure over time - diffuse scaly erythematous macules with underlying dyspigmentation - Recommend daily broad spectrum sunscreen SPF 30+ to sun-exposed areas, reapply every 2 hours as needed.  - Recommend staying in the shade or wearing long sleeves, sun glasses (UVA+UVB protection) and wide brim hats (4-inch brim around the entire circumference of the hat). - Call for new or changing lesions.  Lentigines - Scattered tan macules - Due to sun exposure - Benign-appering, observe - Recommend daily broad spectrum sunscreen SPF 30+ to sun-exposed areas, reapply every 2 hours as needed. - Call for any  changes  Melanocytic Nevi - Tan-brown and/or pink-flesh-colored symmetric macules and papules - Benign appearing on exam today - Observation - Call clinic for new or changing moles - Recommend daily use of broad spectrum spf 30+ sunscreen to sun-exposed areas.   Nevus Right pretibia  Benign-appearing.  Observation.  Call clinic for new or changing moles.  Recommend daily use of broad spectrum spf 30+ sunscreen to sun-exposed areas.   Acne vulgaris face  Chronic and persistent condition with duration or expected duration over one year. Condition is bothersome/symptomatic for patient with recent flare after stopping medication for IUD removal.  Has started to improved with restarting spironolactone.  BP 118/68  Continue Differin 0.3% Gel qhs dsp 45g 3Rf. Topical retinoid medications like tretinoin/Retin-A, adapalene/Differin, tazarotene/Fabior, and Epiduo/Epiduo Forte can cause dryness and irritation when first started. Only apply a pea-sized amount to the entire affected area. Avoid applying it around the eyes, edges of mouth and creases at the nose. If you experience irritation, use a good moisturizer first and/or apply the medicine less often. If you are doing well with the medicine, you can increase how often you use it until you are applying every night. Be careful with sun protection while using this medication as it can make you sensitive to the sun. This medicine should not be used by pregnant women.    Continue Spironolatcone '100mg'$  take 1 po QD dsp #90 0Rf. Spironolactone can cause increased urination and cause blood pressure to decrease. Please watch for signs of lightheadedness and be cautious when changing position. It can sometimes cause breast tenderness or an irregular period in premenopausal women. It can also increase potassium. The increase in  potassium usually is not a concern unless you are taking other medicines that also increase potassium, so please be sure your doctor  knows all of the other medications you are taking. This medication should not be taken by pregnant women.  This medicine should also not be taken together with sulfa drugs like Bactrim (trimethoprim/sulfamethexazole).      Scar Right Wrist  Benign, observe.    Secondary to trauma from lawnmower as a child.    Return in about 1 year (around 02/10/2023) for TBSE, Acne.  IJamesetta Orleans, CMA, am acting as scribe for Brendolyn Patty, MD .  Documentation: I have reviewed the above documentation for accuracy and completeness, and I agree with the above.  Brendolyn Patty MD

## 2022-02-09 NOTE — Patient Instructions (Signed)
Due to recent changes in healthcare laws, you may see results of your pathology and/or laboratory studies on MyChart before the doctors have had a chance to review them. We understand that in some cases there may be results that are confusing or concerning to you. Please understand that not all results are received at the same time and often the doctors may need to interpret multiple results in order to provide you with the best plan of care or course of treatment. Therefore, we ask that you please give us 2 business days to thoroughly review all your results before contacting the office for clarification. Should we see a critical lab result, you will be contacted sooner.   If You Need Anything After Your Visit  If you have any questions or concerns for your doctor, please call our main line at [phone] and press option 4 to reach your doctor's medical assistant. If no one answers, please leave a voicemail as directed and we will return your call as soon as possible. Messages left after 4 pm will be answered the following business day.   You may also send us a message via MyChart. We typically respond to MyChart messages within 1-2 business days.  For prescription refills, please ask your pharmacy to contact our office. Our fax number is [phone].  If you have an urgent issue when the clinic is closed that cannot wait until the next business day, you can page your doctor at the number below.    Please note that while we do our best to be available for urgent issues outside of office hours, we are not available 24/7.   If you have an urgent issue and are unable to reach us, you may choose to seek medical care at your doctor's office, retail clinic, urgent care center, or emergency room.  If you have a medical emergency, please immediately call 911 or go to the emergency department.  Pager Numbers  - Dr. Kowalski: [phone]  - Dr. Moye: [phone]  - Dr. Stewart:  [phone]  In the event of inclement weather, please call our main line at [phone] for an update on the status of any delays or closures.  Dermatology Medication Tips: Please keep the boxes that topical medications come in in order to help keep track of the instructions about where and how to use these. Pharmacies typically print the medication instructions only on the boxes and not directly on the medication tubes.   If your medication is too expensive, please contact our office at [phone] option 4 or send us a message through MyChart.   We are unable to tell what your co-pay for medications will be in advance as this is different depending on your insurance coverage. However, we may be able to find a substitute medication at lower cost or fill out paperwork to get insurance to cover a needed medication.   If a prior authorization is required to get your medication covered by your insurance company, please allow us 1-2 business days to complete this process.  Drug prices often vary depending on where the prescription is filled and some pharmacies may offer cheaper prices.  The website www.goodrx.com contains coupons for medications through different pharmacies. The prices here do not account for what the cost may be with help from insurance (it may be cheaper with your insurance), but the website can give you the price if you did not use any insurance.  - You can print the associated coupon and take it with   your prescription to the pharmacy.  - You may also stop by our office during regular business hours and pick up a GoodRx coupon card.  - If you need your prescription sent electronically to a different pharmacy, notify our office through [facility] MyChart or by phone at [phone] option 4.     Si Usted Necesita Algo Despus de Su Visita  Tambin puede enviarnos un mensaje a travs de MyChart. Por lo general respondemos a los mensajes de MyChart en el transcurso de 1 a 2  das hbiles.  Para renovar recetas, por favor pida a su farmacia que se ponga en contacto con nuestra oficina. Nuestro nmero de fax es el [phone].  Si tiene un asunto urgente cuando la clnica est cerrada y que no puede esperar hasta el siguiente da hbil, puede llamar/localizar a su doctor(a) al nmero que aparece a continuacin.   Por favor, tenga en cuenta que aunque hacemos todo lo posible para estar disponibles para asuntos urgentes fuera del horario de oficina, no estamos disponibles las 24 horas del da, los 7 das de la semana.   Si tiene un problema urgente y no puede comunicarse con nosotros, puede optar por buscar atencin mdica  en el consultorio de su doctor(a), en una clnica privada, en un centro de atencin urgente o en una sala de emergencias.  Si tiene una emergencia mdica, por favor llame inmediatamente al 911 o vaya a la sala de emergencias.  Nmeros de bper  - Dr. Kowalski: [phone]  - Dra. Moye: [phone]  - Dra. Stewart: [phone]  En caso de inclemencias del tiempo, por favor llame a nuestra lnea principal al [phone] para una actualizacin sobre el estado de cualquier retraso o cierre.  Consejos para la medicacin en dermatologa: Por favor, guarde las cajas en las que vienen los medicamentos de uso tpico para ayudarle a seguir las instrucciones sobre dnde y cmo usarlos. Las farmacias generalmente imprimen las instrucciones del medicamento slo en las cajas y no directamente en los tubos del medicamento.   Si su medicamento es muy caro, por favor, pngase en contacto con nuestra oficina llamando al [phone] y presione la opcin 4 o envenos un mensaje a travs de MyChart.   No podemos decirle cul ser su copago por los medicamentos por adelantado ya que esto es diferente dependiendo de la cobertura de su seguro. Sin embargo, es posible que podamos encontrar un medicamento sustituto a menor costo o llenar un formulario para que el  seguro cubra el medicamento que se considera necesario.   Si se requiere una autorizacin previa para que su compaa de seguros cubra su medicamento, por favor permtanos de 1 a 2 das hbiles para completar este proceso.  Los precios de los medicamentos varan con frecuencia dependiendo del lugar de dnde se surte la receta y alguna farmacias pueden ofrecer precios ms baratos.  El sitio web www.goodrx.com tiene cupones para medicamentos de diferentes farmacias. Los precios aqu no tienen en cuenta lo que podra costar con la ayuda del seguro (puede ser ms barato con su seguro), pero el sitio web puede darle el precio si no utiliz ningn seguro.  - Puede imprimir el cupn correspondiente y llevarlo con su receta a la farmacia.  - Tambin puede pasar por nuestra oficina durante el horario de atencin regular y recoger una tarjeta de cupones de GoodRx.  - Si necesita que su receta se enve electrnicamente a una farmacia diferente, informe a nuestra oficina a travs de MyChart de [facility]   o por telfono llamando al [phone] y presione la opcin 4.  

## 2022-02-11 ENCOUNTER — Encounter: Payer: Self-pay | Admitting: Gastroenterology

## 2022-02-12 ENCOUNTER — Other Ambulatory Visit: Payer: Self-pay

## 2022-02-12 DIAGNOSIS — R0789 Other chest pain: Secondary | ICD-10-CM

## 2022-02-15 ENCOUNTER — Encounter: Payer: Self-pay | Admitting: Gastroenterology

## 2022-02-18 ENCOUNTER — Other Ambulatory Visit: Payer: Self-pay | Admitting: Gastroenterology

## 2022-02-18 ENCOUNTER — Ambulatory Visit: Payer: Medicaid Other | Admitting: Anesthesiology

## 2022-02-18 ENCOUNTER — Encounter
Admission: RE | Disposition: A | Discharge status: Home / Self Care | Payer: Self-pay | Source: Home / Self Care | Attending: Gastroenterology

## 2022-02-18 ENCOUNTER — Encounter: Payer: Self-pay | Admitting: Gastroenterology

## 2022-02-18 ENCOUNTER — Ambulatory Visit
Admission: RE | Admit: 2022-02-18 | Discharge: 2022-02-18 | Disposition: A | Discharge status: Home / Self Care | Payer: Medicaid Other | Attending: Gastroenterology | Admitting: Gastroenterology

## 2022-02-18 ENCOUNTER — Encounter: Payer: Self-pay | Admitting: Dermatology

## 2022-02-18 ENCOUNTER — Other Ambulatory Visit: Payer: Self-pay

## 2022-02-18 ENCOUNTER — Ambulatory Visit (AMBULATORY_SURGERY_CENTER): Payer: Medicaid Other | Admitting: Anesthesiology

## 2022-02-18 DIAGNOSIS — K2100 Gastro-esophageal reflux disease with esophagitis, without bleeding: Secondary | ICD-10-CM | POA: Insufficient documentation

## 2022-02-18 DIAGNOSIS — R1013 Epigastric pain: Secondary | ICD-10-CM

## 2022-02-18 DIAGNOSIS — L709 Acne, unspecified: Secondary | ICD-10-CM | POA: Insufficient documentation

## 2022-02-18 DIAGNOSIS — R0789 Other chest pain: Secondary | ICD-10-CM | POA: Diagnosis not present

## 2022-02-18 DIAGNOSIS — K449 Diaphragmatic hernia without obstruction or gangrene: Secondary | ICD-10-CM

## 2022-02-18 DIAGNOSIS — K2210 Ulcer of esophagus without bleeding: Secondary | ICD-10-CM | POA: Diagnosis not present

## 2022-02-18 HISTORY — DX: Migraine, unspecified, not intractable, without status migrainosus: G43.909

## 2022-02-18 HISTORY — DX: Gastro-esophageal reflux disease without esophagitis: K21.9

## 2022-02-18 HISTORY — PX: ESOPHAGOGASTRODUODENOSCOPY (EGD) WITH PROPOFOL: SHX5813

## 2022-02-18 LAB — POCT PREGNANCY, URINE: Preg Test, Ur: NEGATIVE

## 2022-02-18 SURGERY — ESOPHAGOGASTRODUODENOSCOPY (EGD) WITH PROPOFOL
Anesthesia: General

## 2022-02-18 MED ORDER — SODIUM CHLORIDE 0.9 % IV SOLN: INTRAVENOUS | Status: DC

## 2022-02-18 MED ORDER — STERILE WATER FOR IRRIGATION IR SOLN
Status: DC | PRN
  Administered 2022-02-18: 1000 mL

## 2022-02-18 MED ORDER — LACTATED RINGERS IV SOLN: INTRAVENOUS | Status: DC

## 2022-02-18 MED ORDER — OMEPRAZOLE 40 MG PO CPDR: 40.0000 mg | DELAYED_RELEASE_CAPSULE | Freq: Every day | ORAL | 0 refills | Status: DC

## 2022-02-18 MED ORDER — PROPOFOL 10 MG/ML IV BOLUS
INTRAVENOUS | Status: DC | PRN
  Administered 2022-02-18: 60 mg via INTRAVENOUS
  Administered 2022-02-18: 70 mg via INTRAVENOUS
  Administered 2022-02-18: 40 mg via INTRAVENOUS
  Administered 2022-02-18: 30 mg via INTRAVENOUS

## 2022-02-18 MED ORDER — LIDOCAINE HCL (CARDIAC) PF 100 MG/5ML IV SOSY
PREFILLED_SYRINGE | INTRAVENOUS | Status: DC | PRN
  Administered 2022-02-18: 50 mg via INTRAVENOUS

## 2022-02-18 SURGICAL SUPPLY — 32 items

## 2022-02-18 NOTE — H&P (Signed)
Evelyn Darby, MD [address]  [place]  [place], [place] [address]  Main: [phone]  Fax: [phone] Pager: [phone]  Primary Care Physician:  Pcp, No Primary Gastroenterologist:  Dr. Cephas Caldwell  Pre-Procedure History & Physical: HPI:  Evelyn Caldwell is a 31 y.o. female is here for an endoscopy.   Past Medical History:  Diagnosis Date   Acne    Anxiety    GERD (gastroesophageal reflux disease)    History of kidney stones    Kidney stone    Migraine headache    with season changes   Vulvar lesion     Past Surgical History:  Procedure Laterality Date   CYSTOSCOPY WITH STENT PLACEMENT Right 07/27/2018   Procedure: CYSTOSCOPY WITH STENT PLACEMENT;  Surgeon: Billey Co, MD;  Location: [facility];  Service: Urology;  Laterality: Right;   CYSTOSCOPY/URETEROSCOPY/HOLMIUM LASER/STENT PLACEMENT Right 08/03/2018   Procedure: CYSTOSCOPY/URETEROSCOPY/HOLMIUM LASER/STENT EXCHANGE **WITH NST BEFORE AND AFTER**;  Surgeon: [name], MD;  Location: [facility];  Service: Urology;  Laterality: Right;   CYSTOSCOPY/URETEROSCOPY/HOLMIUM LASER/STENT PLACEMENT Right 03/02/2021   Procedure: CYSTOSCOPY/URETEROSCOPY/HOLMIUM LASER/STENT PLACEMENT;  Surgeon: [name], MD;  Location: [facility];  Service: Urology;  Laterality: Right;   EXTRACORPOREAL SHOCK WAVE LITHOTRIPSY Left 02/15/2019   Procedure: EXTRACORPOREAL SHOCK WAVE LITHOTRIPSY (ESWL);  Surgeon: [name], MD;  Location: [facility];  Service: Urology;  Laterality: Left;   none      Prior to Admission medications   Medication Sig Start Date End Date Taking? Authorizing Provider  diazepam (VALIUM) 2 MG tablet Take 1 tablet (2 mg total) by mouth every 8 (eight) hours as needed for muscle spasms. 12/16/21  Yes Paulette Blanch, MD  DIFFERIN 0.3 % gel Apply a pea-sized amount to face every night as tolerated. 02/09/22  Yes Brendolyn Patty, MD  famotidine (PEPCID) 20 MG tablet Take 1 tablet (20 mg total) by mouth  2 (two) times daily. 12/16/21  Yes Paulette Blanch, MD  spironolactone (ALDACTONE) 100 MG tablet TAKE 1 TABLET(100 MG) BY MOUTH DAILY 02/09/22  Yes Brendolyn Patty, MD    Allergies as of 02/12/2022   (No Known Allergies)    Family History  Problem Relation Age of Onset   Congestive Heart Failure Maternal Grandfather    Parkinson's disease Paternal Grandmother    Kidney failure Cousin    Kidney cancer Neg Hx    Prostate cancer Neg Hx    Breast cancer Neg Hx    Ovarian cancer Neg Hx    Colon cancer Neg Hx     Social History   Socioeconomic History   Marital status: Married    Spouse name: Not on file   Number of children: Not on file   Years of education: Not on file   Highest education level: Not on file  Occupational History   Not on file  Tobacco Use   Smoking status: Never   Smokeless tobacco: Never  Vaping Use   Vaping Use: Never used  Substance and Sexual Activity   Alcohol use: Yes    Comment: occasionally   Drug use: No   Sexual activity: Yes    Birth control/protection: I.U.D.  Other Topics Concern   Not on file  Social History Narrative   Not on file   Social Determinants of Health   Financial Resource Strain: Low Risk  (10/18/2018)   Overall Financial Resource Strain (CARDIA)    Difficulty of Paying Living Expenses: Not hard at all  Food Insecurity:  Unknown (10/18/2018)   Hunger Vital Sign    Worried About Running Out of Food in the Last Year: Patient refused    [place] in the Last Year: Patient refused  Transportation Needs: Unknown (10/18/2018)   PRAPARE - Transportation    Lack of Transportation (Medical): Patient refused    Lack of Transportation (Non-Medical): Patient refused  Physical Activity: Unknown (10/18/2018)   Exercise Vital Sign    Days of Exercise per Week: Patient refused    Minutes of Exercise per Session: Patient refused  Stress: Not on file  Social Connections: Unknown (10/18/2018)   Social Connection and Isolation Panel  [NHANES]    Frequency of Communication with Friends and Family: Patient refused    Frequency of Social Gatherings with Friends and Family: Patient refused    Attends Religious Services: Patient refused    Active Member of Clubs or Organizations: Patient refused    Attends Archivist Meetings: Patient refused    Marital Status: Patient refused  Intimate Partner Violence: Unknown (10/18/2018)   Humiliation, Afraid, Rape, and Kick questionnaire    Fear of Current or Ex-Partner: Patient refused    Emotionally Abused: Patient refused    Physically Abused: Patient refused    Sexually Abused: Patient refused    Review of Systems: See HPI, otherwise negative ROS  Physical Exam: BP 107/78   Pulse 83   Temp 97.7 F (36.5 C) (Temporal)   Ht '5\' 7"'$  (1.702 m)   Wt 91.2 kg   LMP 02/10/2022 (Exact Date)   SpO2 98%   BMI 31.48 kg/m  General:   Alert,  pleasant and cooperative in NAD Head:  Normocephalic and atraumatic. Neck:  Supple; no masses or thyromegaly. Lungs:  Clear throughout to auscultation.    Heart:  Regular rate and rhythm. Abdomen:  Soft, nontender and nondistended. Normal bowel sounds, without guarding, and without rebound.   Neurologic:  Alert and  oriented x4;  grossly normal neurologically.  Impression/Plan: Evelyn Caldwell is here for an endoscopy to be performed for epigastric pain  Risks, benefits, limitations, and alternatives regarding  endoscopy have been reviewed with the patient.  Questions have been answered.  All parties agreeable.   Sherri Sear, MD  02/18/2022, 10:54 AM

## 2022-02-18 NOTE — Op Note (Signed)
[facility] Patient Name: Evelyn Caldwell Procedure Date: 02/18/2022 10:58 AM MRN: [id number] Account #: [id number] Date of Birth: April 06, 1991 Admit Type: Outpatient Age: 31 Room: [facility] OR ROOM 01 Gender: Female Note Status: Finalized Instrument Name: [id number] Procedure:             Upper GI endoscopy Indications:           Epigastric abdominal pain, Chest pain (non cardiac) Providers:             Lin Landsman MD, MD Medicines:             General Anesthesia Complications:         No immediate complications. Estimated blood loss: None. Procedure:             Pre-Anesthesia Assessment:                        - Prior to the procedure, a History and Physical was                         performed, and patient medications and allergies were                         reviewed. The patient is competent. The risks and                         benefits of the procedure and the sedation options and                         risks were discussed with the patient. All questions                         were answered and informed consent was obtained.                         Patient identification and proposed procedure were                         verified by the physician, the nurse, the                         anesthesiologist, the anesthetist and the technician                         in the pre-procedure area in the procedure room in the                         endoscopy suite. Mental Status Examination: alert and                         oriented. Airway Examination: normal oropharyngeal                         airway and neck mobility. Respiratory Examination:                         clear to auscultation. CV Examination: normal.  Prophylactic Antibiotics: The patient does not require                         prophylactic antibiotics. Prior Anticoagulants: The                         patient has taken no previous anticoagulant or                          antiplatelet agents. ASA Grade Assessment: II - A                         patient with mild systemic disease. After reviewing                         the risks and benefits, the patient was deemed in                         satisfactory condition to undergo the procedure. The                         anesthesia plan was to use general anesthesia.                         Immediately prior to administration of medications,                         the patient was re-assessed for adequacy to receive                         sedatives. The heart rate, respiratory rate, oxygen                         saturations, blood pressure, adequacy of pulmonary                         ventilation, and response to care were monitored                         throughout the procedure. The physical status of the                         patient was re-assessed after the procedure.                        After obtaining informed consent, the endoscope was                         passed under direct vision. Throughout the procedure,                         the patient's blood pressure, pulse, and oxygen                         saturations were monitored continuously. The Endoscope                         was introduced through the mouth, and advanced to the  second part of duodenum. The upper GI endoscopy was                         accomplished without difficulty. The patient tolerated                         the procedure well. Findings:      The duodenal bulb and second portion of the duodenum were normal.       Biopsies were taken with a cold forceps for histology.      The entire examined stomach was normal. Biopsies were taken with a cold       forceps for histology.      A small hiatal hernia was present.      LA Grade A (one or more mucosal breaks less than 5 mm, not extending       between tops of 2 mucosal folds) esophagitis with no bleeding was found       in  the distal esophagus.      Esophagogastric landmarks were identified: the gastroesophageal junction       was found at 36 cm from the incisors.      The upper third of the esophagus and middle third of the esophagus were       normal. Biopsies were taken with a cold forceps for histology. Impression:            - Normal duodenal bulb and second portion of the                         duodenum. Biopsied.                        - Normal stomach. Biopsied.                        - Small hiatal hernia.                        - LA Grade A reflux esophagitis with no bleeding.                        - Esophagogastric landmarks identified.                        - Normal upper third of esophagus and middle third of                         esophagus. Biopsied. Recommendation:        - Await pathology results.                        - Discharge patient to home (with escort).                        - Resume previous diet today.                        - Continue present medications.                        - Follow an antireflux regimen.                        -  Use a proton pump inhibitor PO BID for 1 month. Procedure Code(s):     --- Professional ---                        [phone], Esophagogastroduodenoscopy, flexible,                         transoral; with biopsy, single or multiple Diagnosis Code(s):     --- Professional ---                        K44.9, Diaphragmatic hernia without obstruction or                         gangrene                        K21.00, Gastro-esophageal reflux disease with                         esophagitis, without bleeding                        R10.13, Epigastric pain                        R07.89, Other chest pain CPT copyright 2019 American Medical Association. All rights reserved. The codes documented in this report are preliminary and upon coder review may  be revised to meet current compliance requirements. Dr. Ulyess Mort Lin Landsman MD, MD 02/18/2022  11:16:24 AM This report has been signed electronically. Number of Addenda: 0 Note Initiated On: 02/18/2022 10:58 AM Total Procedure Duration: 0 hours 7 minutes 8 seconds  Estimated Blood Loss:  Estimated blood loss: none.      [facility]

## 2022-02-18 NOTE — Transfer of Care (Signed)
Immediate Anesthesia Transfer of Care Note  Patient: Evelyn Caldwell [facility]  Procedure(s) Performed: ESOPHAGOGASTRODUODENOSCOPY (EGD) WITH PROPOFOL  Patient Location: PACU  Anesthesia Type: General  Level of Consciousness: awake, alert  and patient cooperative  Airway and Oxygen Therapy: Patient Spontanous Breathing and Patient connected to supplemental oxygen  Post-op Assessment: Post-op Vital signs reviewed, Patient's Cardiovascular Status Stable, Respiratory Function Stable, Patent Airway and No signs of Nausea or vomiting  Post-op Vital Signs: Reviewed and stable  Complications: No notable events documented.

## 2022-02-18 NOTE — Anesthesia Preprocedure Evaluation (Addendum)
Anesthesia Evaluation  Patient identified by MRN, date of birth, ID band Patient awake    Reviewed: Allergy & Precautions, NPO status , Patient's Chart, lab work & pertinent test results  History of Anesthesia Complications Negative for: history of anesthetic complications  Airway Mallampati: II  TM Distance: >3 FB Neck ROM: full    Dental no notable dental hx.    Pulmonary neg pulmonary ROS, neg shortness of breath,    Pulmonary exam normal        Cardiovascular Exercise Tolerance: Good (-) angina(-) Past MI and (-) DOE negative cardio ROS Normal cardiovascular exam     Neuro/Psych  Headaches, PSYCHIATRIC DISORDERS Anxiety    GI/Hepatic Neg liver ROS, GERD  Medicated and Controlled,  Endo/Other  negative endocrine ROS  Renal/GU negative Renal ROS     Musculoskeletal   Abdominal (+) + obese,   Peds  Hematology negative hematology ROS (+)   Anesthesia Other Findings Past Medical History: No date: Acne No date: Anxiety No date: History of kidney stones No date: Kidney stone No date: Vulvar lesion  Past Surgical History: 07/27/2018: CYSTOSCOPY WITH STENT PLACEMENT; Right     Comment:  Procedure: CYSTOSCOPY WITH STENT PLACEMENT;  Surgeon:               Billey Co, MD;  Location: [facility];  Service:               Urology;  Laterality: Right; 08/03/2018: CYSTOSCOPY/URETEROSCOPY/HOLMIUM LASER/STENT PLACEMENT; Right     Comment:  Procedure: CYSTOSCOPY/URETEROSCOPY/HOLMIUM LASER/STENT               EXCHANGE **WITH NST BEFORE AND AFTER**;  Surgeon:               [name], MD;  Location: [facility];  Service:               Urology;  Laterality: Right; 02/15/2019: EXTRACORPOREAL SHOCK WAVE LITHOTRIPSY; Left     Comment:  Procedure: EXTRACORPOREAL SHOCK WAVE LITHOTRIPSY (ESWL);              Surgeon: [name], MD;  Location: [facility];                Service: Urology;  Laterality: Left; No date:  none  BMI    Body Mass Index: 30.54 kg/m      Reproductive/Obstetrics negative OB ROS                            Anesthesia Physical  Anesthesia Plan  ASA: 2  Anesthesia Plan: General   Post-op Pain Management: Minimal or no pain anticipated   Induction: Intravenous  PONV Risk Score and Plan: Midazolam, Propofol infusion, Treatment may vary due to age or medical condition and TIVA  Airway Management Planned: Natural Airway and Nasal Cannula  Additional Equipment:   Intra-op Plan:   Post-operative Plan:   Informed Consent: I have reviewed the patients History and Physical, chart, labs and discussed the procedure including the risks, benefits and alternatives for the proposed anesthesia with the patient or authorized representative who has indicated his/her understanding and acceptance.     Dental Advisory Given  Plan Discussed with: Anesthesiologist, CRNA and Surgeon  Anesthesia Plan Comments: (Patient consented for risks of anesthesia including but not limited to:  - adverse reactions to medications - damage to eyes, teeth, lips or other oral mucosa - nerve damage due to positioning  - sore throat or  hoarseness - Damage to heart, brain, nerves, lungs, other parts of body or loss of life  Patient voiced understanding.)       Anesthesia Quick Evaluation

## 2022-02-18 NOTE — Anesthesia Postprocedure Evaluation (Signed)
Anesthesia Post Note  Patient: Evelyn Caldwell  Procedure(s) Performed: ESOPHAGOGASTRODUODENOSCOPY (EGD) WITH PROPOFOL     Patient location during evaluation: PACU Anesthesia Type: General Level of consciousness: awake and alert Pain management: pain level controlled Vital Signs Assessment: post-procedure vital signs reviewed and stable Respiratory status: spontaneous breathing, nonlabored ventilation and respiratory function stable Cardiovascular status: blood pressure returned to baseline and stable Postop Assessment: no apparent nausea or vomiting Anesthetic complications: no   No notable events documented.  [place] Ouch

## 2022-02-19 ENCOUNTER — Encounter: Payer: Self-pay | Admitting: Gastroenterology

## 2022-02-19 ENCOUNTER — Telehealth: Payer: Self-pay | Admitting: Gastroenterology

## 2022-02-19 LAB — SURGICAL PATHOLOGY

## 2022-02-19 NOTE — Telephone Encounter (Signed)
Pt had procedure on [id number] and would like a call back because she is having some upper chest pains and was wondering if this is normal after procedure. #[phone]

## 2022-02-19 NOTE — Telephone Encounter (Signed)
Patient also sent a mychart message and forward this to Dr. Marius Ditch

## 2022-03-24 MED ORDER — FINACEA 15 % EX GEL: 1.0000 | Freq: Two times a day (BID) | CUTANEOUS | 2 refills | Status: DC

## 2022-04-01 ENCOUNTER — Encounter: Payer: Self-pay | Admitting: Certified Nurse Midwife

## 2022-05-27 ENCOUNTER — Other Ambulatory Visit: Payer: Self-pay | Admitting: Dermatology

## 2022-09-13 ENCOUNTER — Ambulatory Visit: Payer: Self-pay | Admitting: Obstetrics

## 2022-09-14 ENCOUNTER — Encounter: Payer: Self-pay | Admitting: Certified Nurse Midwife

## 2022-10-21 ENCOUNTER — Ambulatory Visit (INDEPENDENT_AMBULATORY_CARE_PROVIDER_SITE_OTHER): Payer: Self-pay | Admitting: Obstetrics

## 2022-10-21 ENCOUNTER — Encounter: Payer: Self-pay | Admitting: Obstetrics

## 2022-10-21 ENCOUNTER — Other Ambulatory Visit (HOSPITAL_COMMUNITY)
Admission: RE | Admit: 2022-10-21 | Discharge: 2022-10-21 | Disposition: A | Discharge status: Home / Self Care | Payer: 59 | Source: Ambulatory Visit | Attending: Obstetrics | Admitting: Obstetrics

## 2022-10-21 VITALS — BP 109/73 | HR 91 | Ht 67.0 in | Wt 209.0 lb

## 2022-10-21 DIAGNOSIS — Z124 Encounter for screening for malignant neoplasm of cervix: Secondary | ICD-10-CM

## 2022-10-21 DIAGNOSIS — Z01419 Encounter for gynecological examination (general) (routine) without abnormal findings: Secondary | ICD-10-CM

## 2022-10-21 MED ORDER — SERTRALINE HCL 25 MG PO TABS: 25.0000 mg | ORAL_TABLET | Freq: Every day | ORAL | 6 refills | Status: DC

## 2022-10-21 NOTE — Progress Notes (Signed)
ANNUAL GYNECOLOGICAL EXAM  SUBJECTIVE  HPI  Evelyn Caldwell is a 32 y.o.-year-old [id number] who presents for an annual gynecological exam today.  She denies pelvic pain, dyspareunia, abnormal vaginal bleeding or discharge, and UTI symptoms. She is currently sexually active with one female partner. She reports daily irritability and feeling on edge. She was on Prozac for a while with some relief but was having intolerable hot flashes.  Medical/Surgical History Past Medical History:  Diagnosis Date   Acne    Anxiety    GERD (gastroesophageal reflux disease)    History of kidney stones    Kidney stone    Migraine headache    with season changes   Vulvar lesion    Past Surgical History:  Procedure Laterality Date   CYSTOSCOPY WITH STENT PLACEMENT Right 07/27/2018   Procedure: CYSTOSCOPY WITH STENT PLACEMENT;  Surgeon: Sondra Come, MD;  Location: [facility];  Service: Urology;  Laterality: Right;   CYSTOSCOPY/URETEROSCOPY/HOLMIUM LASER/STENT PLACEMENT Right 08/03/2018   Procedure: CYSTOSCOPY/URETEROSCOPY/HOLMIUM LASER/STENT EXCHANGE **WITH NST BEFORE AND AFTER**;  Surgeon: Vanna Scotland, MD;  Location: [facility];  Service: Urology;  Laterality: Right;   CYSTOSCOPY/URETEROSCOPY/HOLMIUM LASER/STENT PLACEMENT Right 03/02/2021   Procedure: CYSTOSCOPY/URETEROSCOPY/HOLMIUM LASER/STENT PLACEMENT;  Surgeon: Vanna Scotland, MD;  Location: [facility];  Service: Urology;  Laterality: Right;   ESOPHAGOGASTRODUODENOSCOPY (EGD) WITH PROPOFOL N/A 02/18/2022   Procedure: ESOPHAGOGASTRODUODENOSCOPY (EGD) WITH PROPOFOL;  Surgeon: Toney Reil, MD;  Location: [facility];  Service: Endoscopy;  Laterality: N/A;   EXTRACORPOREAL SHOCK WAVE LITHOTRIPSY Left 02/15/2019   Procedure: EXTRACORPOREAL SHOCK WAVE LITHOTRIPSY (ESWL);  Surgeon: Vanna Scotland, MD;  Location: [facility];  Service: Urology;  Laterality: Left;   none      Social History Lives with husband and 2 children. Feels safe  there Work: [employer] Exercise: walking Substances: Occasional EtOH; denies tobacco, vape, and recreational drugs  Obstetric History OB History     Gravida  2   Para  2   Term  2   Preterm      AB      Living  2      SAB      IAB      Ectopic      Multiple  0   Live Births  2            GYN/Menstrual History No LMP recorded. Periods every month Last Pap: 06/29/19, NILM Contraception: condoms and fertility awareness  Prevention Dentist: planning to go Eye exam: regular visit Mammogram: at 39 Colonoscopy: at 52  Current Medications Outpatient Medications Prior to Visit  Medication Sig   spironolactone (ALDACTONE) 100 MG tablet TAKE 1 TABLET(100 MG) BY MOUTH DAILY   diazepam (VALIUM) 2 MG tablet Take 1 tablet (2 mg total) by mouth every 8 (eight) hours as needed for muscle spasms. (Patient not taking: Reported on 10/21/2022)   DIFFERIN 0.3 % gel Apply a pea-sized amount to face every night as tolerated. (Patient not taking: Reported on 10/21/2022)   FINACEA 15 % gel Apply 1 Application topically 2 (two) times daily. After skin is thoroughly washed and patted dry, gently but thoroughly massage a thin film of gel into the affected area twice daily, in the morning and evening. (Patient not taking: Reported on 10/21/2022)   omeprazole (PRILOSEC) 40 MG capsule TAKE 1 CAPSULE(40 MG) BY MOUTH DAILY BEFORE BREAKFAST (Patient not taking: Reported on 10/21/2022)   No facility-administered medications prior to visit.      Upstream - 10/21/22 5638  Pregnancy Intention Screening   Does the patient want to become pregnant in the next year? Ok Either Way    Does the patient's partner want to become pregnant in the next year? Ok Either Way    Would the patient like to discuss contraceptive options today? No      Contraception Wrap Up   Current Method No Contraceptive Precautions    End Method No Contraception Precautions    Contraception Counseling Provided No    How  was the end contraceptive method provided? N/A            The pregnancy intention screening data noted above was reviewed. Potential methods of contraception were discussed. The patient elected to proceed with No Contraception Precautions.   ROS Constitutional: Denied constitutional symptoms, night sweats, recent illness, fatigue, fever, insomnia and weight loss.  Eyes: Denied eye symptoms, eye pain, photophobia, vision change and visual disturbance.  Ears/Nose/Throat/Neck: Denied ear, nose, throat or neck symptoms, hearing loss, nasal discharge, sinus congestion and sore throat.  Cardiovascular: Denied cardiovascular symptoms, arrhythmia, chest pain/pressure, edema, exercise intolerance, orthopnea and palpitations.  Respiratory: Denied pulmonary symptoms, asthma, pleuritic pain, productive sputum, cough, dyspnea and wheezing.  Gastrointestinal: Denied, gastro-esophageal reflux, melena, nausea and vomiting.  Genitourinary: Denied genitourinary symptoms including symptomatic vaginal discharge, pelvic relaxation issues, and urinary complaints.  Musculoskeletal: Denied musculoskeletal symptoms, stiffness, swelling, muscle weakness and myalgia.  Dermatologic: Denied dermatology symptoms, rash and scar.  Neurologic: Denied neurology symptoms, dizziness, headache, neck pain and syncope.  Psychiatric: +irritability and anxiety  Endocrine: Denied endocrine symptoms including hot flashes and night sweats.    OBJECTIVE  Ht 5\' 7"  (1.702 m)   Wt (!) 956 lb (433.6 kg)   BMI 149.73 kg/m    Physical examination General NAD, Conversant  HEENT Atraumatic; Op clear with mmm.  Normo-cephalic. Pupils reactive. Anicteric sclerae  Thyroid/Neck Smooth without nodularity or enlargement. Normal ROM.  Neck Supple.  Skin No rashes, lesions or ulceration. Normal palpated skin turgor. No nodularity.  Breasts: No masses or discharge.  Symmetric.  No axillary adenopathy.  Lungs: Clear to auscultation.No rales  or wheezes. Normal Respiratory effort, no retractions.  Heart: NSR.  No murmurs or rubs appreciated. No peripheral edema  Abdomen: Soft.  Non-tender.  No masses.  No HSM. No hernia  Extremities: Moves all appropriately.  Normal ROM for age. No lymphadenopathy.  Neuro: Oriented to PPT.  Normal mood. Normal affect.     Pelvic:   Vulva: Normal appearance.  No lesions.  Vagina: No lesions or abnormalities noted.  Support: Normal pelvic support.  Urethra No masses tenderness or scarring.  Meatus Normal size without lesions or prolapse.  Cervix: Normal appearance.  No lesions.  Anus: Normal exam.  No lesions.  Perineum: Normal exam.  No lesions.    ASSESSMENT  1) Annual exam 2) Anxiety/irritability  PLAN 1) Physical exam as noted. Discussed healthy lifestyle choices and preventive care. Encouraged physical activity. Declines STI testing and routine labs. Pap collected. 2) Discussed anxiety management. She would like to start Zoloft. Discussed side effects, proper administration, titrating up to therapeutic dose, and danger signs. Rx sent to pharmacy on file.   Return for mood check after returning from travel and in one year for annual exam.   Guadlupe Spanish, CNM

## 2022-10-27 ENCOUNTER — Encounter: Payer: Self-pay | Admitting: Obstetrics

## 2022-10-27 LAB — CYTOLOGY - PAP
Comment: NEGATIVE
Diagnosis: NEGATIVE
High risk HPV: NEGATIVE

## 2023-01-06 ENCOUNTER — Other Ambulatory Visit: Payer: Self-pay | Admitting: Dermatology

## 2023-01-11 ENCOUNTER — Ambulatory Visit: Payer: Self-pay | Admitting: Family

## 2023-01-25 ENCOUNTER — Ambulatory Visit: Payer: Self-pay | Admitting: Family

## 2023-02-14 ENCOUNTER — Ambulatory Visit: Payer: Medicaid Other | Admitting: Dermatology

## 2023-02-28 ENCOUNTER — Ambulatory Visit (INDEPENDENT_AMBULATORY_CARE_PROVIDER_SITE_OTHER): Payer: Self-pay | Admitting: Dermatology

## 2023-02-28 VITALS — BP 112/73 | HR 82

## 2023-02-28 DIAGNOSIS — L700 Acne vulgaris: Secondary | ICD-10-CM | POA: Diagnosis not present

## 2023-02-28 MED ORDER — SPIRONOLACTONE 100 MG PO TABS: ORAL_TABLET | ORAL | 3 refills | Status: DC

## 2023-02-28 NOTE — Progress Notes (Signed)
   Follow-Up Visit   Subjective  Evelyn Caldwell is a 32 y.o. female who presents for the following: Acne Vulgaris - patient did stop Spironolactone 100 mg po QD and Differin 0.3% gel, while TTC, but acne flared significantly so she restarted medications about 5 months ago. She tolerates medications well without side effects. Needs rfs.  The following portions of the chart were reviewed this encounter and updated as appropriate: medications, allergies, medical history  Review of Systems:  No other skin or systemic complaints except as noted in HPI or Assessment and Plan.  Objective  Well appearing patient in no apparent distress; mood and affect are within normal limits.  Areas Examined: Face  Relevant exam findings are noted in the Assessment and Plan.   Assessment & Plan    ACNE VULGARIS Exam: Few resolving inflammatory papules on the R cheek and neck.   Chronic condition with duration or expected duration over one year. Currently well-controlled.  Treatment Plan: Continue Spironolactone 100 mg po QD. Stop medication ASAP if pregnancy occurs.   Spironolactone can cause increased urination and cause blood pressure to decrease. Please watch for signs of lightheadedness and be cautious when changing position. It can sometimes cause breast tenderness or an irregular period in premenopausal women. It can also increase potassium. The increase in potassium usually is not a concern unless you are taking other medicines that also increase potassium, so please be sure your doctor knows all of the other medications you are taking. This medication should not be taken by pregnant women.  This medicine should also not be taken together with sulfa drugs like Bactrim (trimethoprim/sulfamethexazole).   Continue Differin 0.3% gel at bedtime as tolerated.  Topical retinoid medications like tretinoin/Retin-A, adapalene/Differin, tazarotene/Fabior, and Epiduo/Epiduo Forte can cause dryness and  irritation when first started. Only apply a pea-sized amount to the entire affected area. Avoid applying it around the eyes, edges of mouth and creases at the nose. If you experience irritation, use a good moisturizer first and/or apply the medicine less often. If you are doing well with the medicine, you can increase how often you use it until you are applying every night. Be careful with sun protection while using this medication as it can make you sensitive to the sun. This medicine should not be used by pregnant women.   If pregnancy occurs stop Spironolactone and Differin, and start Finacea gel. Pt will contact us if she needs refills of topical acne medications.  Return in about 1 year (around 02/28/2024) for acne follow up.  Evelyn Caldwell, CMA, am acting as scribe for Willeen Niece, MD .  Documentation: I have reviewed the above documentation for accuracy and completeness, and I agree with the above.  Willeen Niece, MD

## 2023-02-28 NOTE — Progress Notes (Deleted)
   Follow-Up Visit   Subjective  Evelyn Caldwell is a 32 y.o. female who presents for the following: Skin Cancer Screening and Full Body Skin Exam  The patient presents for Total-Body Skin Exam (TBSE) for skin cancer screening and mole check. The patient has spots, moles and lesions to be evaluated, some may be new or changing and the patient may have concern these could be cancer.    The following portions of the chart were reviewed this encounter and updated as appropriate: medications, allergies, medical history  Review of Systems:  No other skin or systemic complaints except as noted in HPI or Assessment and Plan.  Objective  Well appearing patient in no apparent distress; mood and affect are within normal limits.  A full examination was performed including scalp, head, eyes, ears, nose, lips, neck, chest, axillae, abdomen, back, buttocks, bilateral upper extremities, bilateral lower extremities, hands, feet, fingers, toes, fingernails, and toenails. All findings within normal limits unless otherwise noted below.   Relevant physical exam findings are noted in the Assessment and Plan.    Assessment & Plan   SKIN CANCER SCREENING PERFORMED TODAY.  ACTINIC DAMAGE - Chronic condition, secondary to cumulative UV/sun exposure - diffuse scaly erythematous macules with underlying dyspigmentation - Recommend daily broad spectrum sunscreen SPF 30+ to sun-exposed areas, reapply every 2 hours as needed.  - Staying in the shade or wearing long sleeves, sun glasses (UVA+UVB protection) and wide brim hats (4-inch brim around the entire circumference of the hat) are also recommended for sun protection.  - Call for new or changing lesions.  LENTIGINES, SEBORRHEIC KERATOSES, HEMANGIOMAS - Benign normal skin lesions - Benign-appearing - Call for any changes  MELANOCYTIC NEVI - R pretibia 0.6 x 0.4 cm medium ligth brown macule. - Tan-brown and/or pink-flesh-colored symmetric macules and  papules - Benign appearing on exam today - Observation - Call clinic for new or changing moles - Recommend daily use of broad spectrum spf 30+ sunscreen to sun-exposed areas.   ACNE VULGARIS Exam: Open comedones and inflammatory papules***  Chronic condition with duration or expected duration over one year. Currently well-controlled.   Treatment Plan: ***        No follow-ups on file.  Maylene Roes, CMA, am acting as scribe for Willeen Niece, MD .   Documentation: I have reviewed the above documentation for accuracy and completeness, and I agree with the above.  Willeen Niece, MD

## 2023-02-28 NOTE — Patient Instructions (Addendum)
Spironolactone can cause increased urination and cause blood pressure to decrease. Please watch for signs of lightheadedness and be cautious when changing position. It can sometimes cause breast tenderness or an irregular period in premenopausal women. It can also increase potassium. The increase in potassium usually is not a concern unless you are taking other medicines that also increase potassium, so please be sure your doctor knows all of the other medications you are taking. This medication should not be taken by pregnant women.  This medicine should also not be taken together with sulfa drugs like Bactrim (trimethoprim/sulfamethexazole).    Due to recent changes in healthcare laws, you may see results of your pathology and/or laboratory studies on MyChart before the doctors have had a chance to review them. We understand that in some cases there may be results that are confusing or concerning to you. Please understand that not all results are received at the same time and often the doctors may need to interpret multiple results in order to provide you with the best plan of care or course of treatment. Therefore, we ask that you please give [place] 2 business days to thoroughly review all your results before contacting the office for clarification. Should we see a critical lab result, you will be contacted sooner.   If You Need Anything After Your Visit  If you have any questions or concerns for your doctor, please call our main line at [phone] and press option 4 to reach your doctor's medical assistant. If no one answers, please leave a voicemail as directed and we will return your call as soon as possible. Messages left after 4 pm will be answered the following business day.   You may also send [place] a message via MyChart. We typically respond to MyChart messages within 1-2 business days.  For prescription refills, please ask your pharmacy to contact our office. Our fax number is [phone].  If  you have an urgent issue when the clinic is closed that cannot wait until the next business day, you can page your doctor at the number below.    Please note that while we do our best to be available for urgent issues outside of office hours, we are not available 24/7.   If you have an urgent issue and are unable to reach [place], you may choose to seek medical care at your doctor's office, retail clinic, urgent care center, or emergency room.  If you have a medical emergency, please immediately call 911 or go to the emergency department.  Pager Numbers  - Dr. Gwen Pounds: [phone]  - Dr. [name]: [phone]  - Dr. Katrinka Blazing: [phone]   In the event of inclement weather, please call our main line at [phone] for an update on the status of any delays or closures.  Dermatology Medication Tips: Please keep the boxes that topical medications come in in order to help keep track of the instructions about where and how to use these. Pharmacies typically print the medication instructions only on the boxes and not directly on the medication tubes.   If your medication is too expensive, please contact our office at [phone] option 4 or send [place] a message through MyChart.   We are unable to tell what your co-pay for medications will be in advance as this is different depending on your insurance coverage. However, we may be able to find a substitute medication at lower cost or fill out paperwork to get insurance to cover a needed medication.   If a  prior authorization is required to get your medication covered by your insurance company, please allow [place] 1-2 business days to complete this process.  Drug prices often vary depending on where the prescription is filled and some pharmacies may offer cheaper prices.  The website www.goodrx.com contains coupons for medications through different pharmacies. The prices here do not account for what the cost may be with help from insurance (it may be  cheaper with your insurance), but the website can give you the price if you did not use any insurance.  - You can print the associated coupon and take it with your prescription to the pharmacy.  - You may also stop by our office during regular business hours and pick up a GoodRx coupon card.  - If you need your prescription sent electronically to a different pharmacy, notify our office through [facility] or by phone at [phone] option 4.     Si Usted Necesita Algo Despus de Su Visita  Tambin puede enviarnos un mensaje a travs de Clinical cytogeneticist. Por lo general respondemos a los mensajes de MyChart en el transcurso de 1 a 2 das hbiles.  Para renovar recetas, por favor pida a su farmacia que se ponga en contacto con nuestra oficina. Annie Sable de fax es [place] [phone].  Si tiene un asunto urgente cuando la clnica est cerrada y que no puede esperar hasta el siguiente da hbil, puede llamar/localizar a su doctor(a) al nmero que aparece a continuacin.   Por favor, tenga en cuenta que aunque hacemos todo lo posible para estar disponibles para asuntos urgentes fuera del horario de [place], no estamos disponibles las 24 horas del da, los 7 [address]  [address] de la [place].   Si tiene un problema urgente y no puede comunicarse con nosotros, puede optar por buscar atencin mdica  en el consultorio de su doctor(a), en una clnica privada, en un centro de atencin urgente o en una sala de emergencias.  Si tiene Engineer, drilling, por favor llame inmediatamente al 911 o vaya a la sala de emergencias.  Nmeros de bper  - Dr. Gwen Pounds: [phone]  - Dra. [name]: [phone]  - Dr. Katrinka Blazing: [phone]   En caso de inclemencias del tiempo, por favor llame a Lacy Duverney principal al [phone] para una actualizacin sobre el [place] de cualquier retraso o cierre.  Consejos para la medicacin en dermatologa: Por favor, guarde las cajas en las que vienen los medicamentos de uso  tpico para ayudarle a seguir las instrucciones sobre dnde y cmo usarlos. Las farmacias generalmente imprimen las instrucciones del medicamento slo en las cajas y no directamente en los tubos del [place].   Si su medicamento es muy caro, por favor, pngase en contacto con Rolm Gala llamando al [phone] y presione la opcin 4 o envenos un mensaje a travs de Clinical cytogeneticist.   No podemos decirle cul ser su copago por los medicamentos por adelantado ya que esto es diferente dependiendo de la cobertura de su seguro. Sin embargo, es posible que podamos encontrar un medicamento sustituto a Audiological scientist un formulario para que el seguro cubra el medicamento que se considera necesario.   Si se requiere una autorizacin previa para que su compaa de seguros [place] su medicamento, por favor permtanos de 1 a 2 das hbiles para completar [address]  Los precios de los medicamentos varan con frecuencia dependiendo del Environmental consultant de dnde se surte la receta y alguna farmacias pueden ofrecer precios ms baratos.  El sitio web www.goodrx.com tiene  cupones para medicamentos de Health and safety inspector. Los precios aqu no tienen en cuenta lo que podra costar con la ayuda del seguro (puede ser ms barato con su seguro), pero el sitio web puede darle el precio si no utiliz Tourist information centre manager.  - Puede imprimir el cupn correspondiente y llevarlo con su receta a la farmacia.  - Tambin puede pasar por nuestra oficina durante el horario de atencin regular y Education officer, museum una tarjeta de cupones de GoodRx.  - Si necesita que su receta se enve electrnicamente a una farmacia diferente, informe a nuestra oficina a travs de MyChart de [place] o por telfono llamando al [phone] y presione la opcin 4.

## 2023-03-30 ENCOUNTER — Telehealth: Payer: Self-pay | Admitting: Gastroenterology

## 2023-03-30 NOTE — Telephone Encounter (Signed)
Pt requesting call back to discuss pain in chest

## 2023-03-30 NOTE — Telephone Encounter (Signed)
Patient states that she has been having the same pain she was having a year ago. She states a year ago she was told she had erosive esophagitis. She is wondering if we could refill the same medication for her. Informed her she would need a appointment since she has not been seen in over a year. Made appointment for patient

## 2023-05-11 ENCOUNTER — Ambulatory Visit: Payer: Self-pay | Admitting: Gastroenterology

## 2023-06-24 IMAGING — CR DG ABDOMEN 1V
1 series · 2 of 2 positions shown · non-contrast
Comparison: KUB 10/15/2020

CLINICAL DATA: Kidney stones

EXAM:
ABDOMEN - 1 VIEW

[Series 1: dg abd 1 view · 0.14mm/px · 2 of 2 slices shown]
[im 1/2]
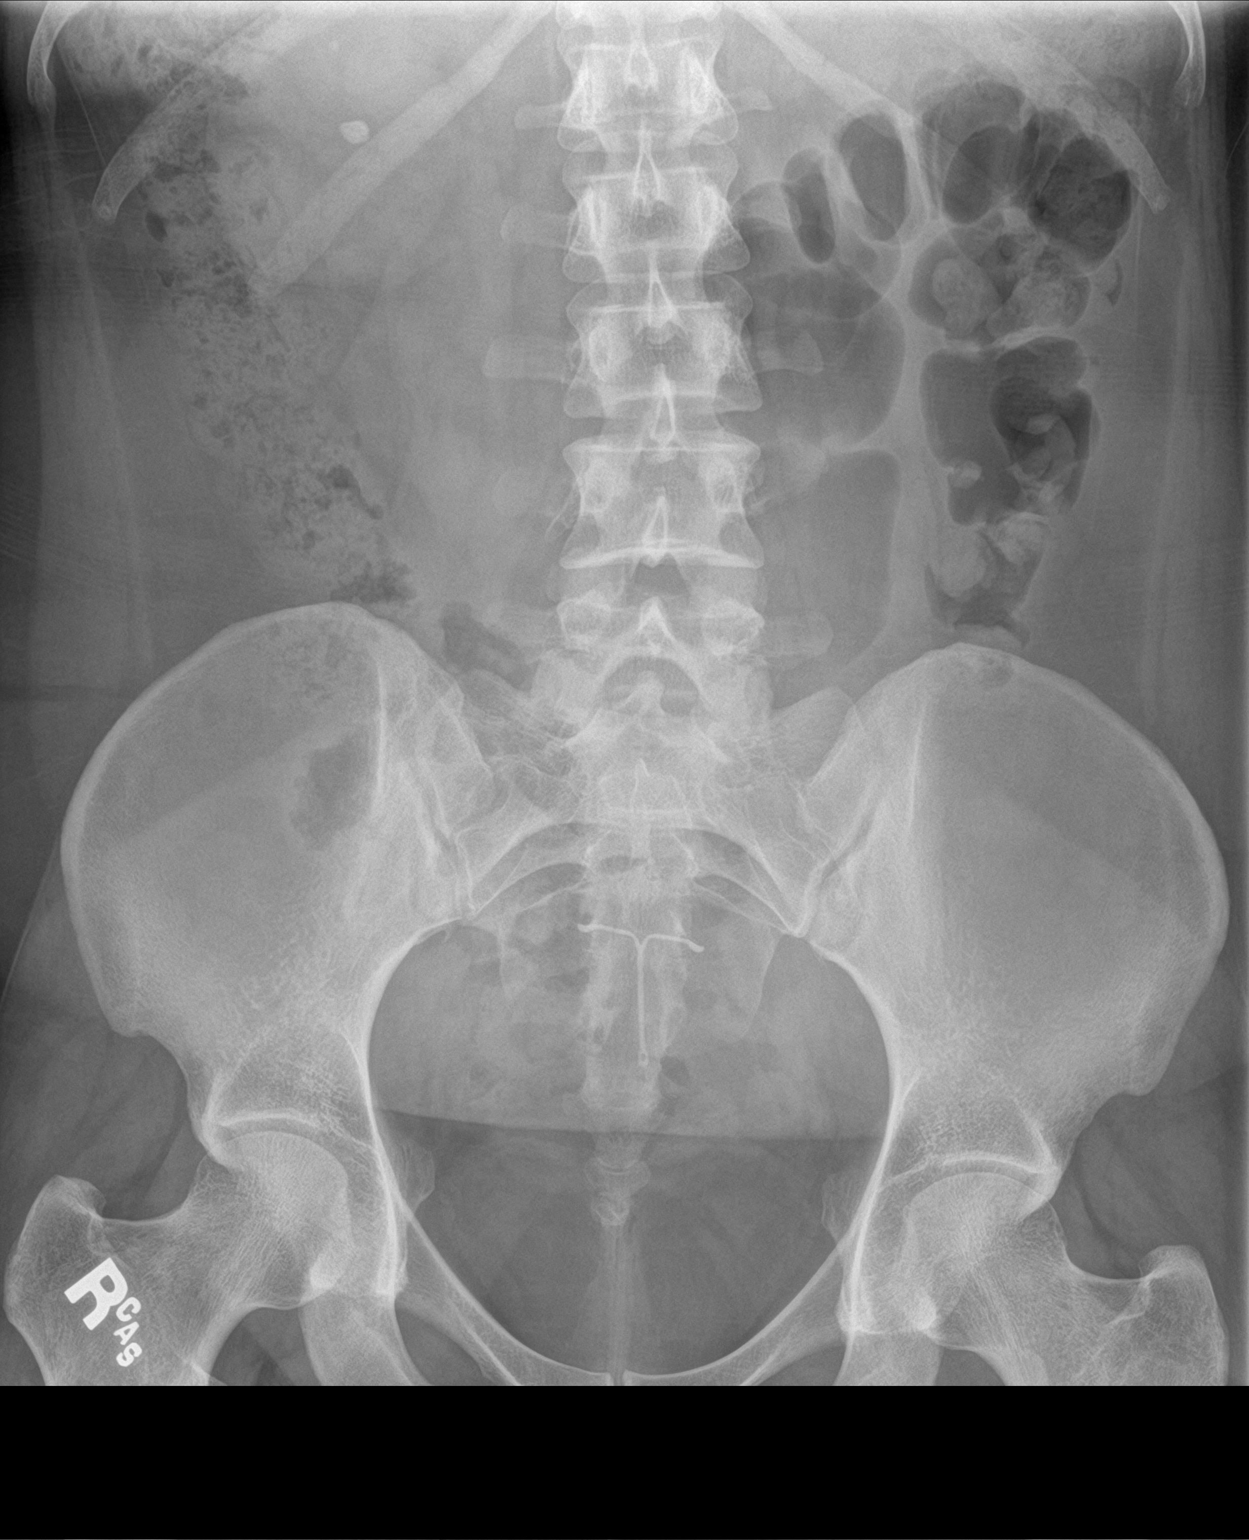
[im 2/2]
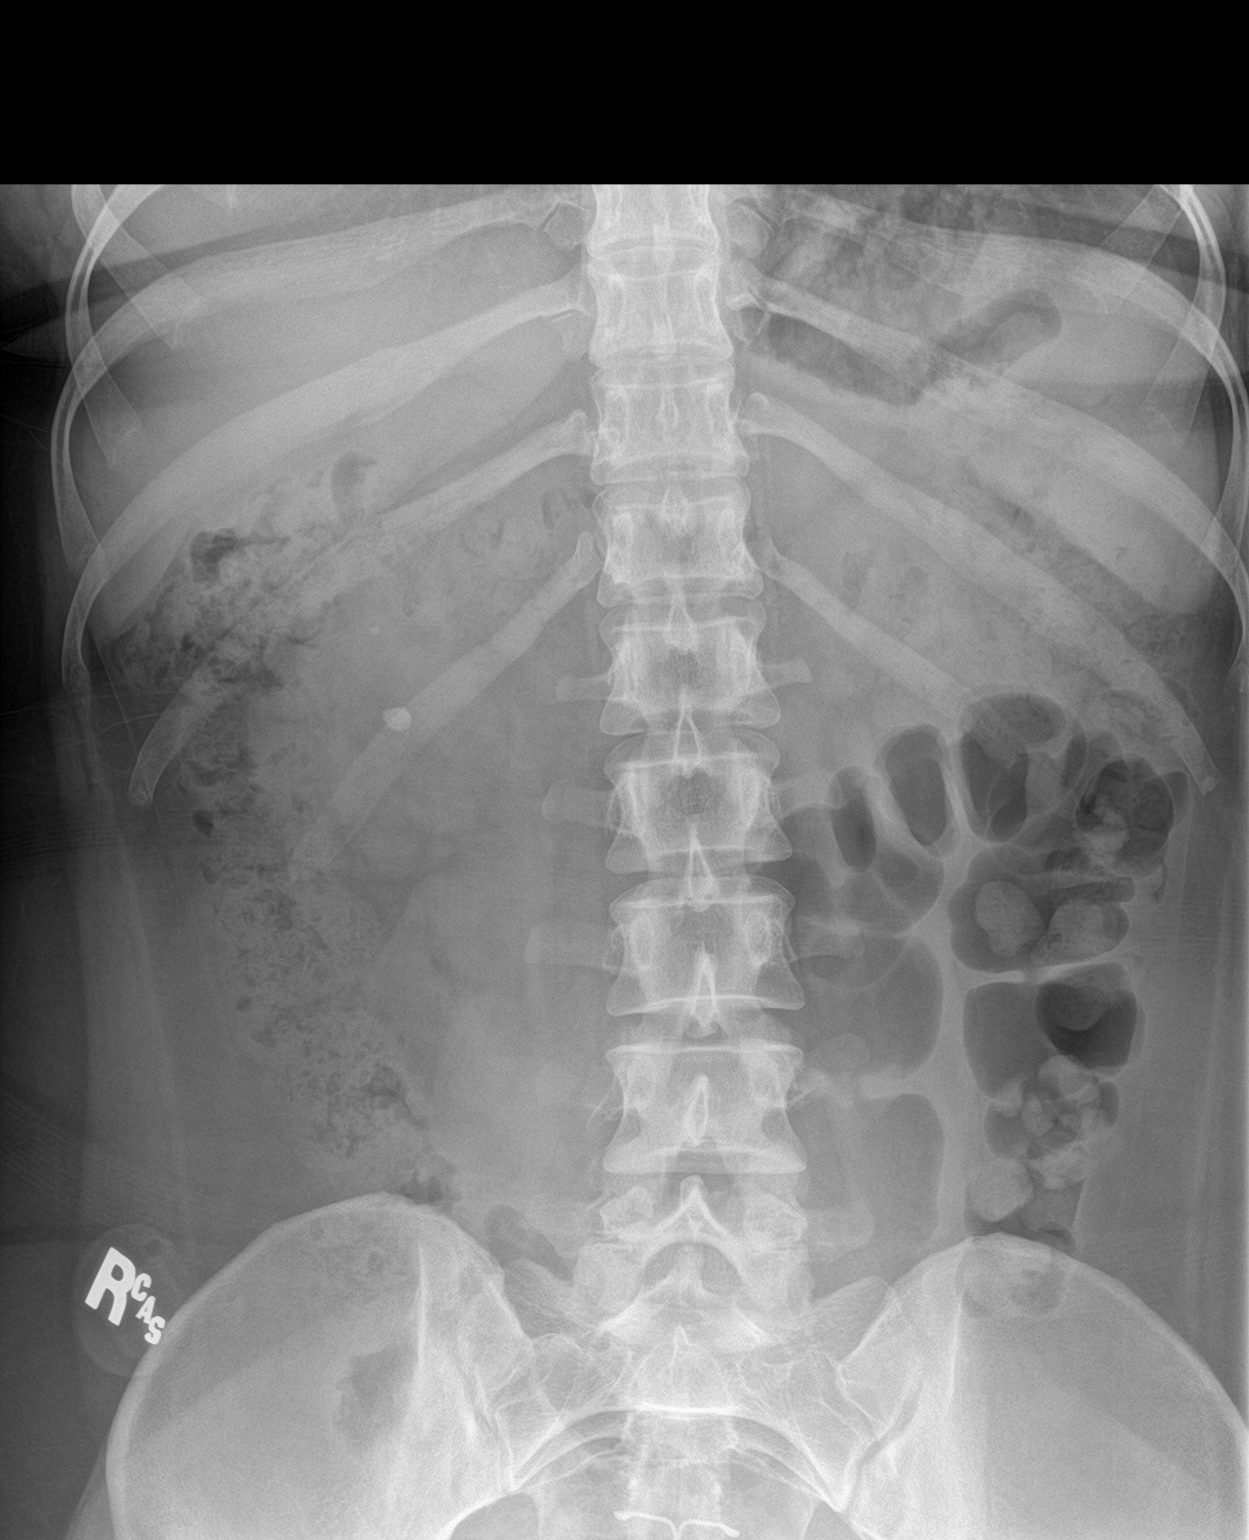

[2 of 2 positions shown; findings below may reference images not displayed]

FINDINGS: Two right renal stones are seen measuring approximately 2 mm in the
upper pole and 8 mm in the interpolar region, similar in size
compared to the study from 10/15/2020. No definite left renal stones
are seen.

There is a nonobstructive bowel gas pattern. An IUD is seen in the
pelvis. The bones are unremarkable.
IMPRESSION: Two unchanged right renal stones measuring up to 8 mm.

## 2023-07-06 ENCOUNTER — Ambulatory Visit (INDEPENDENT_AMBULATORY_CARE_PROVIDER_SITE_OTHER): Payer: Self-pay

## 2023-07-06 ENCOUNTER — Encounter: Payer: Self-pay | Admitting: Licensed Practical Nurse

## 2023-07-06 VITALS — BP 115/77 | HR 83 | Ht 67.0 in | Wt 217.0 lb

## 2023-07-06 DIAGNOSIS — Z3201 Encounter for pregnancy test, result positive: Secondary | ICD-10-CM

## 2023-07-06 DIAGNOSIS — Z3200 Encounter for pregnancy test, result unknown: Secondary | ICD-10-CM

## 2023-07-06 LAB — POCT URINE PREGNANCY: Preg Test, Ur: POSITIVE — AB

## 2023-07-06 NOTE — Addendum Note (Signed)
 Addended by: Abram Abraham on: 07/06/2023 11:11 AM   Modules accepted: Level of Service

## 2023-07-06 NOTE — Progress Notes (Signed)
    NURSE VISIT NOTE  Subjective:    Patient ID: Rollene Jeoffrey Barren, female    DOB: 04-Jun-1990, 33 y.o.   MRN: [id number]  HPI  Patient is a 33 y.o. [id number] female who presents for evaluation of amenorrhea. She believes she could be pregnant. Pregnancy is desired.  Current symptoms also include: breast tenderness, nausea, and positive home pregnancy test. Last period was normal.    Objective:    BP 115/77   Pulse 83   Ht 5' 7 (1.702 m)   Wt 217 lb (98.4 kg)   LMP 06/02/2023   BMI 33.99 kg/m   Lab Review  Results for orders placed or performed in visit on 07/06/23  POCT urine pregnancy  Result Value Ref Range   Preg Test, Ur Positive (A) Negative    Assessment:   1. Possible pregnancy, not confirmed     Plan:   Pregnancy Test: Positive  Encouraged well-balanced diet, plenty of rest when needed, pre-natal vitamins daily and walking for exercise.  Discussed self-help for nausea, avoiding OTC medications until consulting provider or pharmacist, other than Tylenol  as needed, minimal caffeine  (1-2 cups daily) and avoiding alcohol.   She will schedule her nurse visit @ 7-[redacted] wks pregnant, u/s for dating @10  wk, and NOB visit at [redacted] wk pregnant.    Feel free to call with any questions.    Tarquin Welcher H Hendrik Donath, CMA

## 2023-07-08 ENCOUNTER — Other Ambulatory Visit: Payer: Self-pay | Admitting: Obstetrics

## 2023-07-21 ENCOUNTER — Ambulatory Visit: Payer: Self-pay

## 2023-07-22 ENCOUNTER — Other Ambulatory Visit (HOSPITAL_COMMUNITY)
Admission: RE | Admit: 2023-07-22 | Discharge: 2023-07-22 | Disposition: A | Discharge status: Home / Self Care | Payer: Self-pay | Source: Ambulatory Visit | Attending: Obstetrics and Gynecology | Admitting: Obstetrics and Gynecology

## 2023-07-22 ENCOUNTER — Ambulatory Visit: Payer: Self-pay

## 2023-07-22 VITALS — BP 106/58 | HR 84 | Ht 67.0 in | Wt 219.1 lb

## 2023-07-22 DIAGNOSIS — Z1159 Encounter for screening for other viral diseases: Secondary | ICD-10-CM

## 2023-07-22 DIAGNOSIS — Z113 Encounter for screening for infections with a predominantly sexual mode of transmission: Secondary | ICD-10-CM

## 2023-07-22 DIAGNOSIS — Z3689 Encounter for other specified antenatal screening: Secondary | ICD-10-CM

## 2023-07-22 DIAGNOSIS — Z3490 Encounter for supervision of normal pregnancy, unspecified, unspecified trimester: Secondary | ICD-10-CM | POA: Insufficient documentation

## 2023-07-22 DIAGNOSIS — Z3481 Encounter for supervision of other normal pregnancy, first trimester: Secondary | ICD-10-CM | POA: Insufficient documentation

## 2023-07-22 DIAGNOSIS — Z0189 Encounter for other specified special examinations: Secondary | ICD-10-CM

## 2023-07-22 DIAGNOSIS — Z1379 Encounter for other screening for genetic and chromosomal anomalies: Secondary | ICD-10-CM

## 2023-07-22 DIAGNOSIS — Z3A01 Less than 8 weeks gestation of pregnancy: Secondary | ICD-10-CM

## 2023-07-22 DIAGNOSIS — O99210 Obesity complicating pregnancy, unspecified trimester: Secondary | ICD-10-CM

## 2023-07-22 DIAGNOSIS — Z114 Encounter for screening for human immunodeficiency virus [HIV]: Secondary | ICD-10-CM

## 2023-07-22 NOTE — Progress Notes (Addendum)
 New OB Intake  I connected with  Evelyn Caldwell on 07/22/23 at 10:15 AM EST in office.    I explained I am completing New OB Intake today. We discussed her EDD of 03/08/2024 that is based on LMP of 06/02/2023. Pt is G3/P2002. I reviewed her allergies, medications, Medical/Surgical/OB history, and appropriate screenings. There are no cats in the home.  Based on history, this is a/an pregnancy uncomplicated . Her obstetrical history is significant for obesity and history of nephrolithiasis during pregnancy.  Patient Active Problem List   Diagnosis Date Noted   Atypical chest pain    Abdominal pain, epigastric    Hiatal hernia    Erosive esophagitis    Anxiety 08/26/2020   Internal hemorrhoids 08/18/2016   Status post vacuum-assisted vaginal delivery 03/09/2016   Nephrolithiasis 10/22/2015   Headache, migraine 01/24/2015    Concerns addressed today She has no new concerns today.  Delivery Plans:  Plans to deliver at [facility]  Anatomy US Explained first scheduled [place] will be around 19 weeks.  Pt notified to arrive at 15 minutes early.  Labs Discussed genetic screening with patient. Patient consented genetic testing to be drawn at new OB visit. Discussed possible labs to be drawn at new OB appointment.  COVID Vaccine Patient has not had COVID vaccine.   Social Determinants of Health Food Insecurity: denies food insecurity WIC Referral: Patient is not interested in referral to [facility].  Transportation: Patient denies transportation needs. Childcare: Discussed no children allowed at ultrasound appointments.   First visit review I reviewed new OB appt with pt. I explained she will have blood work and pap smear/pelvic exam if indicated. Explained pt will be seen by Carie Caddy, CNM at first visit; encounter routed to appropriate provider.      Santiago Bumpers, CMA [facility] 07/22/2023  10:02 AM

## 2023-07-22 NOTE — Patient Instructions (Signed)
 First Trimester of Pregnancy  The first trimester of pregnancy starts on the first day of your last monthly period until the end of week 13. This is months 1 through 3 of pregnancy. A week after a sperm fertilizes an egg, the egg will implant into the wall of the uterus and begin to develop into a baby. Body changes during your first trimester Your body goes through many changes during pregnancy. The changes usually return to normal after your baby is born. Physical changes Your breasts may grow larger and may hurt. The area around your nipples may get darker. Your periods will stop. Your hair and nails may grow faster. You may pee more often. Health changes You may tire easily. Your gums may bleed and may be sensitive when you brush and floss. You may not feel hungry. You may have heartburn. You may throw up or feel like you may throw up. You may want to eat some foods, but not others. You may have headaches. You may have trouble pooping (constipation). Other changes Your emotions may change from day to day. You may have more dreams. Follow these instructions at home: Medicines Talk to your health care provider if you're taking medicines. Ask if the medicines are safe to take during pregnancy. Your provider may change the medicines that you take. Do not take any medicines unless told to by your provider. Take a prenatal vitamin that has at least 600 micrograms (mcg) of folic acid. Do not use herbal medicines, illegal substances, or medicines that are not approved by your provider. Eating and drinking While you're pregnant your body needs extra food for your growing baby. Talk with your provider about what to eat while pregnant. Activity Most women are able to exercise during pregnancy. Exercises may need to change as your pregnancy goes on. Talk to your provider about your activities and exercise routines. Relieving pain and discomfort Wear a good, supportive bra if your breasts  hurt. Rest with your legs raised if you have leg cramps or low back pain. Safety Wear your seatbelt at all times when you're in a car. Talk to your provider if someone hits you, hurts you, or yells at you. Talk with your provider if you're feeling sad or have thoughts of hurting yourself. Lifestyle Certain things can be harmful while you're pregnant. Follow these rules: Do not use hot tubs, steam rooms, or saunas. Do not douche. Do not use tampons or scented pads. Do not drink alcohol,smoke, vape, or use products with nicotine or tobacco in them. If you need help quitting, talk with your provider. Avoid cat litter boxes and soil used by cats. These things carry germs that can cause harm to your pregnancy and your baby. General instructions Keep all follow-up visits. It helps you and your unborn baby stay as healthy as possible. Write down your questions. Take them to your visits. Your provider will: Talk with you about your overall health. Give you advice or refer you to specialists who can help with different needs, including: Prenatal education classes. Mental health and counseling. Foods and healthy eating. Ask for help if you need help with food. Call your dentist and ask to be seen. Brush your teeth with a soft toothbrush. Floss gently. Where to find more information American Pregnancy Association: americanpregnancy.org [employer]: acog.org Office on [employer] Health: [employer] Contact a health care provider if: You feel dizzy, faint, or have a fever. You vomit or have watery poop (diarrhea) for 2  days or more. You have abnormal discharge or bleeding from your vagina. You have pain when you pee or your pee smells bad. You have cramps, pain, or pressure in your belly area. Get help right away if: You have trouble breathing or chest pain. You have any kind of injury, such as from a fall or a car crash. These symptoms may be an  emergency. Get help right away. Call 911. Do not wait to see if the symptoms will go away. Do not drive yourself to the hospital. This information is not intended to replace advice given to you by your health care provider. Make sure you discuss any questions you have with your health care provider. Document Revised: 02/17/2023 Document Reviewed: 09/17/2022 Elsevier Patient Education  2024 Elsevier Inc.  Morning Sickness  Morning sickness is when you throw up or feel like you may throw up during pregnancy. This condition often occurs in the morning, but it can also occur at any time of day. Morning sickness is most common during the first three months of pregnancy, but it can go on throughout the pregnancy. Morning sickness is usually harmless. But if you throw up all the time, you should see your health care provider. You may also hear this condition called nausea and vomiting of pregnancy. What are the causes? The cause of morning sickness is not known. It may be linked to changes in hormones during pregnancy. What increases the risk? You're more likely to have morning sickness if: You had morning sickness in another pregnancy. You're pregnant with more than one baby, such as twins. You had morning sickness in other pregnancies. You have had motion sickness before you were pregnant. You have had bad headaches or migraines before you were pregnant. What are the signs or symptoms? Symptoms of morning sickness include: Feeling like you may throw up. Throwing up. How is this diagnosed? Morning sickness is diagnosed based on your symptoms. How is this treated? Treatment is usually not needed for morning sickness. You may only need to change what you eat. In some cases, your provider may give you: Vitamin B6 supplements. Medicines to prevent throwing up. Ginger. Follow these instructions at home: Medicines Take your medicines only as told by your provider. Do not use any  prescription, over-the-counter, or herbal medicines for morning sickness without first talking with your provider. Take prenatal vitamins. These can stop or lessen the symptoms of morning sickness. If you feel like you may throw up after taking prenatal vitamins, take them at night or with a snack. Eating and drinking     Eat dry toast or crackers before getting out of bed. Eat 5 or 6 small meals a day. Try ginger ale made with real ginger, ginger tea, or ginger candies. Drink fluids throughout the day. Eat protein foods when you need a snack. Nuts, yogurt, and cheese are good choices. Eat dry and bland foods like rice or baked potatoes. Foods that are high in carbohydrates are often helpful. Have someone cook for you if the smell of food makes you want to throw up. Foods to avoid Greasy foods. Fatty foods. Spicy foods. General instructions Try to avoid smells that make you feel sick. Use an air purifier to keep the air in your house free of smells. Try using an acupressure wristband. This is a wristband that's used to treat motion sickness. Try acupuncture. In this treatment, a provider puts thin needles into certain areas of your body to make you feel better. Brush your  teeth after throwing up or rinse with a mix of baking soda and water. The acid in throw-up can hurt your teeth. Contact a health care provider if: Your symptoms do not get better. You feel dizzy or light-headed. You're losing weight. Get help right away if: The feeling that you may throw up will not go away, or you can't stop throwing up. You faint. You have very bad pain in your belly. This information is not intended to replace advice given to you by your health care provider. Make sure you discuss any questions you have with your health care provider. Document Revised: 02/17/2023 Document Reviewed: 08/26/2022 Elsevier Patient Education  2024 Elsevier Inc.  Commonly Asked Questions During Pregnancy  Cats: A  parasite can be excreted in cat feces.  To avoid exposure you need to have another person empty the little box.  If you must empty the litter box you will need to wear gloves.  Wash your hands after handling your cat.  This parasite can also be found in raw or undercooked meat so this should also be avoided.  Colds, Sore Throats, Flu: Please check your medication sheet to see what you can take for symptoms.  If your symptoms are unrelieved by these medications please call the office.  Dental Work: Most any dental work Agricultural consultant recommends is permitted.  X-rays should only be taken during the first trimester if absolutely necessary.  Your abdomen should be shielded with a lead apron during all x-rays.  Please notify your provider prior to receiving any x-rays.  Novocaine is fine; gas is not recommended.  If your dentist requires a note from [place] prior to dental work please call the office and we will provide one for you.  Exercise: Exercise is an important part of staying healthy during your pregnancy.  You may continue most exercises you were accustomed to prior to pregnancy.  Later in your pregnancy you will most likely notice you have difficulty with activities requiring balance like riding a bicycle.  It is important that you listen to your body and avoid activities that put you at a higher risk of falling.  Adequate rest and staying well hydrated are a must!  If you have questions about the safety of specific activities ask your provider.    Exposure to Children with illness: Try to avoid obvious exposure; report any symptoms to [place] when noted,  If you have chicken pos, red measles or mumps, you should be immune to these diseases.   Please do not take any vaccines while pregnant unless you have checked with your OB provider.  Fetal Movement: After 28 weeks we recommend you do "kick counts" twice daily.  Lie or sit down in a calm quiet environment and count your baby movements "kicks".  You should feel  your baby at least 10 times per hour.  If you have not felt 10 kicks within the first hour get up, walk around and have something sweet to eat or drink then repeat for an additional hour.  If count remains less than 10 per hour notify your provider.  Fumigating: Follow your pest control agent's advice as to how long to stay out of your home.  Ventilate the area well before re-entering.  Hemorrhoids:   Most over-the-counter preparations can be used during pregnancy.  Check your medication to see what is safe to use.  It is important to use a stool softener or fiber in your diet and to drink lots of liquids.  If hemorrhoids seem to be getting worse please call the office.   Hot Tubs:  Hot tubs Jacuzzis and saunas are not recommended while pregnant.  These increase your internal body temperature and should be avoided.  Intercourse:  Sexual intercourse is safe during pregnancy as long as you are comfortable, unless otherwise advised by your provider.  Spotting may occur after intercourse; report any bright red bleeding that is heavier than spotting.  Labor:  If you know that you are in labor, please go to the hospital.  If you are unsure, please call the office and let us help you decide what to do.  Lifting, straining, etc:  If your job requires heavy lifting or straining please check with your provider for any limitations.  Generally, you should not lift items heavier than that you can lift simply with your hands and arms (no back muscles)  Painting:  Paint fumes do not harm your pregnancy, but may make you ill and should be avoided if possible.  Latex or water based paints have less odor than oils.  Use adequate ventilation while painting.  Permanents & Hair Color:  Chemicals in hair dyes are not recommended as they cause increase hair dryness which can increase hair loss during pregnancy.  " Highlighting" and permanents are allowed.  Dye may be absorbed differently and permanents may not hold as well  during pregnancy.  Sunbathing:  Use a sunscreen, as skin burns easily during pregnancy.  Drink plenty of fluids; avoid over heating.  Tanning Beds:  Because their possible side effects are still unknown, tanning beds are not recommended.  Ultrasound Scans:  Routine ultrasounds are performed at approximately 20 weeks.  You will be able to see your baby's general anatomy an if you would like to know the gender this can usually be determined as well.  If it is questionable when you conceived you may also receive an ultrasound early in your pregnancy for dating purposes.  Otherwise ultrasound exams are not routinely performed unless there is a medical necessity.  Although you can request a scan we ask that you pay for it when conducted because insurance does not cover " patient request" scans.  Work: If your pregnancy proceeds without complications you may work until your due date, unless your physician or employer advises otherwise.  Round Ligament Pain/Pelvic Discomfort:  Sharp, shooting pains not associated with bleeding are fairly common, usually occurring in the second trimester of pregnancy.  They tend to be worse when standing up or when you remain standing for long periods of time.  These are the result of pressure of certain pelvic ligaments called "round ligaments".  Rest, Tylenol and heat seem to be the most effective relief.  As the womb and fetus grow, they rise out of the pelvis and the discomfort improves.  Please notify the office if your pain seems different than that described.  It may represent a more serious condition.

## 2023-07-23 LAB — CBC/D/PLT+RPR+RH+ABO+RUBIGG...
Antibody Screen: NEGATIVE
Basophils Absolute: 0 10*3/uL (ref 0.0–0.2)
Basos: 0 %
EOS (ABSOLUTE): 0.1 10*3/uL (ref 0.0–0.4)
Eos: 1 %
HCV Ab: NONREACTIVE
HIV Screen 4th Generation wRfx: NONREACTIVE
Hematocrit: 37.7 % (ref 34.0–46.6)
Hemoglobin: 12 g/dL (ref 11.1–15.9)
Hepatitis B Surface Ag: NEGATIVE
Immature Grans (Abs): 0 10*3/uL (ref 0.0–0.1)
Immature Granulocytes: 0 %
Lymphocytes Absolute: 1.6 10*3/uL (ref 0.7–3.1)
Lymphs: 24 %
MCH: 27.7 pg (ref 26.6–33.0)
MCHC: 31.8 g/dL (ref 31.5–35.7)
MCV: 87 fL (ref 79–97)
Monocytes Absolute: 0.3 10*3/uL (ref 0.1–0.9)
Monocytes: 4 %
Neutrophils Absolute: 4.7 10*3/uL (ref 1.4–7.0)
Neutrophils: 71 %
Platelets: 259 10*3/uL (ref 150–450)
RBC: 4.33 x10E6/uL (ref 3.77–5.28)
RDW: 13.6 % (ref 11.7–15.4)
RPR Ser Ql: NONREACTIVE
Rh Factor: POSITIVE
Rubella Antibodies, IGG: 3.57 {index} (ref >0.99)
Varicella zoster IgG: REACTIVE
WBC: 6.7 10*3/uL (ref 3.4–10.8)

## 2023-07-23 LAB — URINALYSIS, ROUTINE W REFLEX MICROSCOPIC
Bilirubin, UA: NEGATIVE
Glucose, UA: NEGATIVE
Ketones, UA: NEGATIVE
Nitrite, UA: NEGATIVE
RBC, UA: NEGATIVE
Specific Gravity, UA: 1.026 (ref 1.005–1.030)
Urobilinogen, Ur: 0.2 mg/dL (ref 0.2–1.0)
pH, UA: 5.5 (ref 5.0–7.5)

## 2023-07-23 LAB — HCV INTERPRETATION

## 2023-07-23 LAB — COMPREHENSIVE METABOLIC PANEL
ALT: 17 IU/L (ref 0–32)
AST: 19 IU/L (ref 0–40)
Albumin: 4 g/dL (ref 3.9–4.9)
Alkaline Phosphatase: 97 IU/L (ref 44–121)
BUN/Creatinine Ratio: 21 (ref 9–23)
BUN: 12 mg/dL (ref 6–20)
Bilirubin Total: 0.2 mg/dL (ref 0.0–1.2)
CO2: 20 mmol/L (ref 20–29)
Calcium: 9.2 mg/dL (ref 8.7–10.2)
Chloride: 103 mmol/L (ref 96–106)
Creatinine, Ser: 0.58 mg/dL (ref 0.57–1.00)
Globulin, Total: 2.9 g/dL (ref 1.5–4.5)
Glucose: 80 mg/dL (ref 70–99)
Potassium: 4.3 mmol/L (ref 3.5–5.2)
Sodium: 137 mmol/L (ref 134–144)
Total Protein: 6.9 g/dL (ref 6.0–8.5)
eGFR: 122 mL/min/{1.73_m2} (ref >59)

## 2023-07-23 LAB — MICROSCOPIC EXAMINATION
Casts: NONE SEEN /LPF
Epithelial Cells (non renal): >10 /HPF — AB (ref 0–10)
RBC, Urine: NONE SEEN /HPF (ref 0–2)

## 2023-07-23 LAB — TSH+FREE T4
Free T4: 1.11 ng/dL (ref 0.82–1.77)
TSH: 2.12 u[IU]/mL (ref 0.450–4.500)

## 2023-07-23 LAB — HEMOGLOBIN A1C
Est. average glucose Bld gHb Est-mCnc: 100 mg/dL
Hgb A1c MFr Bld: 5.1 % (ref 4.8–5.6)

## 2023-07-24 LAB — URINE CULTURE, OB REFLEX

## 2023-07-24 LAB — CULTURE, OB URINE

## 2023-07-25 ENCOUNTER — Encounter: Payer: Self-pay | Admitting: Certified Nurse Midwife

## 2023-07-25 ENCOUNTER — Telehealth: Payer: Self-pay | Admitting: Urology

## 2023-07-25 LAB — CERVICOVAGINAL ANCILLARY ONLY
Bacterial Vaginitis (gardnerella): NEGATIVE
Candida Glabrata: NEGATIVE
Candida Vaginitis: NEGATIVE
Chlamydia: NEGATIVE
Comment: NEGATIVE
Comment: NEGATIVE
Comment: NEGATIVE
Comment: NEGATIVE
Comment: NEGATIVE
Comment: NORMAL
Neisseria Gonorrhea: NEGATIVE
Trichomonas: NEGATIVE

## 2023-07-25 NOTE — Telephone Encounter (Signed)
 Pt called asking what her next steps shoould be.  She is currently [redacted] weeks pregnant and has a  hx of kidney stones while pregnant.  She is having some slight pain in right side, not bad, that comes and goes.  She had labs done last Friday and her wbc were elevated.  She wants to know if she should try to do anything at this point.  She's trying to be proactive.

## 2023-07-26 LAB — MONITOR DRUG PROFILE 14(MW)
Amphetamine Scrn, Ur: NEGATIVE ng/mL
BARBITURATE SCREEN URINE: NEGATIVE ng/mL
BENZODIAZEPINE SCREEN, URINE: NEGATIVE ng/mL
Buprenorphine, Urine: NEGATIVE ng/mL
CANNABINOIDS UR QL SCN: NEGATIVE ng/mL
Cocaine (Metab) Scrn, Ur: NEGATIVE ng/mL
Creatinine(Crt), U: 207.1 mg/dL (ref 20.0–300.0)
Fentanyl, Urine: NEGATIVE pg/mL
Meperidine Screen, Urine: NEGATIVE ng/mL
Methadone Screen, Urine: NEGATIVE ng/mL
OXYCODONE+OXYMORPHONE UR QL SCN: NEGATIVE ng/mL
Opiate Scrn, Ur: NEGATIVE ng/mL
Ph of Urine: 5.5 (ref 4.5–8.9)
Phencyclidine Qn, Ur: NEGATIVE ng/mL
Propoxyphene Scrn, Ur: NEGATIVE ng/mL
SPECIFIC GRAVITY: 1.025
Tramadol Screen, Urine: NEGATIVE ng/mL

## 2023-07-26 LAB — NICOTINE SCREEN, URINE: Cotinine Ql Scrn, Ur: NEGATIVE ng/mL

## 2023-07-29 NOTE — Telephone Encounter (Signed)
Left detailed message on voicemail-ok per DPR on file 

## 2023-07-29 NOTE — Telephone Encounter (Signed)
 Chart reviewed.  Her labs look fine to me.  If she would like to be seen, she may see Sam or Carollee Herter.  She just needs to drink plenty of water during this pregnancy and add some lemon if possible.    Vanna Scotland, MD

## 2023-07-31 ENCOUNTER — Encounter: Payer: Self-pay | Admitting: Certified Nurse Midwife

## 2023-08-03 ENCOUNTER — Ambulatory Visit (INDEPENDENT_AMBULATORY_CARE_PROVIDER_SITE_OTHER): Payer: Self-pay | Admitting: Physician Assistant

## 2023-08-03 ENCOUNTER — Encounter: Payer: Self-pay | Admitting: Physician Assistant

## 2023-08-03 VITALS — BP 117/77 | HR 91 | Resp 16 | Ht 67.0 in | Wt 216.7 lb

## 2023-08-03 DIAGNOSIS — Z87442 Personal history of urinary calculi: Secondary | ICD-10-CM

## 2023-08-03 DIAGNOSIS — R109 Unspecified abdominal pain: Secondary | ICD-10-CM

## 2023-08-03 LAB — URINALYSIS, COMPLETE
Bilirubin, UA: NEGATIVE
Glucose, UA: NEGATIVE
Ketones, UA: NEGATIVE
Leukocytes,UA: NEGATIVE
Nitrite, UA: NEGATIVE
Protein,UA: NEGATIVE
RBC, UA: NEGATIVE
Specific Gravity, UA: 1.015 (ref 1.005–1.030)
Urobilinogen, Ur: 0.2 mg/dL (ref 0.2–1.0)
pH, UA: 7 (ref 5.0–7.5)

## 2023-08-03 LAB — MICROSCOPIC EXAMINATION

## 2023-08-03 NOTE — Progress Notes (Signed)
 08/03/2023 9:18 AM   Evelyn Caldwell [facility] 23-Sep-1990 [id number]  CC: Chief Complaint  Patient presents with   Flank Pain    Mild comes and goes on R side. Reminds her of last kidney stone years ago. Has had no [place] yet [redacted] weeks pregnant. LMP 06/02/23   HPI: Evelyn Caldwell is a 33 y.o. pregnant female at [redacted]w[redacted]d with PMH nephrolithiasis and flank pain who presents today for evaluation of flank pain.   Today she reports intermittent RUQ pain x 1 month consistent with past stone episodes.  She denies gross hematuria, fevers, or vomiting.  She has not seen a stone pass.  Urine microscopy at her OB intake appointment was notable for 11-30 WBCs/hpf and many bacteria, however it was contaminated with >10 epithelial cells/hpf.  Urine culture grew mixed urogenital flora.  In-office UA and microscopy today pan negative.  LUTS recent renal imaging dated 08/17/2021 was a renal ultrasound, at which point there was no evidence of shadowing stone.  KUB dated 08/05/2021 showed no radiopaque urolithiasis.  PMH: Past Medical History:  Diagnosis Date   Acne    Anxiety    GERD (gastroesophageal reflux disease)    History of kidney stones    Kidney stone    Migraine headache    with season changes   Vulvar lesion     Surgical History: Past Surgical History:  Procedure Laterality Date   CYSTOSCOPY WITH STENT PLACEMENT Right 07/27/2018   Procedure: CYSTOSCOPY WITH STENT PLACEMENT;  Surgeon: Sondra Come, MD;  Location: [facility];  Service: Urology;  Laterality: Right;   CYSTOSCOPY/URETEROSCOPY/HOLMIUM LASER/STENT PLACEMENT Right 08/03/2018   Procedure: CYSTOSCOPY/URETEROSCOPY/HOLMIUM LASER/STENT EXCHANGE **WITH NST BEFORE AND AFTER**;  Surgeon: Vanna Scotland, MD;  Location: [facility];  Service: Urology;  Laterality: Right;   CYSTOSCOPY/URETEROSCOPY/HOLMIUM LASER/STENT PLACEMENT Right 03/02/2021   Procedure: CYSTOSCOPY/URETEROSCOPY/HOLMIUM LASER/STENT PLACEMENT;  Surgeon: Vanna Scotland, MD;   Location: [facility];  Service: Urology;  Laterality: Right;   ESOPHAGOGASTRODUODENOSCOPY (EGD) WITH PROPOFOL N/A 02/18/2022   Procedure: ESOPHAGOGASTRODUODENOSCOPY (EGD) WITH PROPOFOL;  Surgeon: Toney Reil, MD;  Location: [facility];  Service: Endoscopy;  Laterality: N/A;   EXTRACORPOREAL SHOCK WAVE LITHOTRIPSY Left 02/15/2019   Procedure: EXTRACORPOREAL SHOCK WAVE LITHOTRIPSY (ESWL);  Surgeon: Vanna Scotland, MD;  Location: [facility];  Service: Urology;  Laterality: Left;   none      Home Medications:  Allergies as of 08/03/2023   No Known Allergies      Medication List        Accurate as of August 03, 2023  9:18 AM. If you have any questions, ask your nurse or doctor.          PRENATAL VITAMIN PO Take by mouth.        Allergies:  No Known Allergies  Family History: Family History  Problem Relation Age of Onset   Congestive Heart Failure Maternal Grandfather    Parkinson's disease Paternal Grandmother    Kidney failure Cousin    Kidney cancer Neg Hx    Prostate cancer Neg Hx    Breast cancer Neg Hx    Ovarian cancer Neg Hx    Colon cancer Neg Hx     Social History:   reports that she has never smoked. She has never used smokeless tobacco. She reports current alcohol use. She reports that she does not use drugs.  Physical Exam: BP 117/77   Pulse 91   Resp 16   Ht 5\' 7"  (1.702 m)   Wt 216  lb 11.2 oz (98.3 kg)   LMP 06/02/2023   BMI 33.94 kg/m   Constitutional:  Alert and oriented, no acute distress, nontoxic appearing HEENT: [place], AT Cardiovascular: No clubbing, cyanosis, or edema Respiratory: Normal respiratory effort, no increased work of breathing Skin: No rashes, bruises or suspicious lesions Neurologic: Grossly intact, no focal deficits, moving all 4 extremities Psychiatric: Normal mood and affect  Laboratory Data: Results for orders placed or performed in visit on 08/03/23  Microscopic Examination   Collection Time: 08/03/23  9:10 AM    Urine  Result Value Ref Range   WBC, UA 0-5 0 - 5 /hpf   RBC, Urine 0-2 0 - 2 /hpf   Epithelial Cells (non renal) 0-10 0 - 10 /hpf   Crystals Present (A) N/A   Crystal Type Amorphous Sediment N/A   Bacteria, UA Few None seen/Few  Urinalysis, Complete   Collection Time: 08/03/23  9:10 AM  Result Value Ref Range   Specific Gravity, UA 1.015 1.005 - 1.030   pH, UA 7.0 5.0 - 7.5   Color, UA Yellow Yellow   Appearance Ur Cloudy (A) Clear   Leukocytes,UA Negative Negative   Protein,UA Negative Negative/Trace   Glucose, UA Negative Negative   Ketones, UA Negative Negative   RBC, UA Negative Negative   Bilirubin, UA Negative Negative   Urobilinogen, Ur 0.2 0.2 - 1.0 mg/dL   Nitrite, UA Negative Negative   Microscopic Examination See below:    Assessment & Plan:   1. Flank pain with history of urolithiasis (Primary) UAs have been bland, overall low suspicion for stone.  Will obtain renal ultrasound, however we discussed that I anticipate a degree of physiologic right hydronephrosis secondary to her pregnancy.  I am specifically looking for her ureteral jets.  Also check a BMP today to make sure renal function is at baseline.  If this workup is reassuring, we discussed staying well-hydrated and adding citric acid to her fluid intake throughout her pregnancy. - Urinalysis, Complete - US RENAL; Future - Basic metabolic panel   Return for Will call with results.  Carman Ching, PA-C  [facility] [address] [place], [place] [address] [phone]

## 2023-08-04 ENCOUNTER — Ambulatory Visit
Admission: RE | Admit: 2023-08-04 | Discharge: 2023-08-04 | Disposition: A | Discharge status: Home / Self Care | Payer: Self-pay | Source: Ambulatory Visit | Attending: Physician Assistant | Admitting: Physician Assistant

## 2023-08-04 DIAGNOSIS — Z87442 Personal history of urinary calculi: Secondary | ICD-10-CM | POA: Insufficient documentation

## 2023-08-04 DIAGNOSIS — R109 Unspecified abdominal pain: Secondary | ICD-10-CM | POA: Insufficient documentation

## 2023-08-04 LAB — BASIC METABOLIC PANEL
BUN/Creatinine Ratio: 22 (ref 9–23)
BUN: 11 mg/dL (ref 6–20)
CO2: 20 mmol/L (ref 20–29)
Calcium: 9.4 mg/dL (ref 8.7–10.2)
Chloride: 106 mmol/L (ref 96–106)
Creatinine, Ser: 0.5 mg/dL — ABNORMAL LOW (ref 0.57–1.00)
Glucose: 88 mg/dL (ref 70–99)
Potassium: 4.7 mmol/L (ref 3.5–5.2)
Sodium: 140 mmol/L (ref 134–144)
eGFR: 127 mL/min/{1.73_m2} (ref >59)

## 2023-08-10 ENCOUNTER — Other Ambulatory Visit: Payer: Self-pay | Admitting: Licensed Practical Nurse

## 2023-08-10 DIAGNOSIS — O3680X0 Pregnancy with inconclusive fetal viability, not applicable or unspecified: Secondary | ICD-10-CM

## 2023-08-11 ENCOUNTER — Ambulatory Visit (INDEPENDENT_AMBULATORY_CARE_PROVIDER_SITE_OTHER): Payer: Self-pay

## 2023-08-11 ENCOUNTER — Other Ambulatory Visit: Payer: Self-pay

## 2023-08-11 DIAGNOSIS — Z3A01 Less than 8 weeks gestation of pregnancy: Secondary | ICD-10-CM

## 2023-08-11 DIAGNOSIS — Z3481 Encounter for supervision of other normal pregnancy, first trimester: Secondary | ICD-10-CM

## 2023-08-11 DIAGNOSIS — O3680X0 Pregnancy with inconclusive fetal viability, not applicable or unspecified: Secondary | ICD-10-CM

## 2023-08-11 DIAGNOSIS — Z3A09 9 weeks gestation of pregnancy: Secondary | ICD-10-CM

## 2023-08-11 DIAGNOSIS — Z1379 Encounter for other screening for genetic and chromosomal anomalies: Secondary | ICD-10-CM

## 2023-08-15 LAB — MATERNIT 21 PLUS CORE, BLOOD
Fetal Fraction: 16
Result (T21): NEGATIVE
Trisomy 13 (Patau syndrome): NEGATIVE
Trisomy 18 (Edwards syndrome): NEGATIVE
Trisomy 21 (Down syndrome): NEGATIVE

## 2023-08-23 ENCOUNTER — Ambulatory Visit (INDEPENDENT_AMBULATORY_CARE_PROVIDER_SITE_OTHER): Payer: Self-pay | Admitting: Obstetrics and Gynecology

## 2023-08-23 ENCOUNTER — Encounter: Payer: Self-pay | Admitting: Obstetrics and Gynecology

## 2023-08-23 VITALS — BP 109/72 | HR 94 | Wt 214.0 lb

## 2023-08-23 DIAGNOSIS — Z3481 Encounter for supervision of other normal pregnancy, first trimester: Secondary | ICD-10-CM

## 2023-08-23 NOTE — Progress Notes (Signed)
 Transfer NOB  Last pap: 10/21/22 WNL  CC: Round ligament pain

## 2023-08-23 NOTE — Progress Notes (Signed)
 Subjective:    Evelyn Caldwell is a [id number] [redacted]w[redacted]d being seen today for her first obstetrical visit.  Her obstetrical history is significant for obesity and nephrolithiasis. Patient reports hospital admission with her last pregnancy. Patient does intend to breast feed. Pregnancy history fully reviewed. Patient is transferring care from [facility] with whom she received care for her 2 previous pregnancies  Patient reports no complaints.  Vitals:   08/23/23 0940  BP: 109/72  Pulse: 94  Weight: 214 lb (97.1 kg)    HISTORY: OB History  Gravida Para Term Preterm AB Living  3 2 2   2   SAB IAB Ectopic Multiple Live Births     0 2    # Outcome Date GA Lbr Len/2nd Weight Sex Type Anes PTL Lv  3 Current           2 Term 10/18/18 [redacted]w[redacted]d 06:30 / 01:49 8 lb 12.4 oz (3.98 kg) F Vag-Spont EPI  LIV  1 Term 03/09/16 [redacted]w[redacted]d  8 lb 1.6 oz (3.674 kg) F Vag-Spont   LIV   Past Medical History:  Diagnosis Date   Acne    Anxiety    GERD (gastroesophageal reflux disease)    History of kidney stones    Kidney stone    Migraine headache    with season changes   Vulvar lesion    Past Surgical History:  Procedure Laterality Date   CYSTOSCOPY WITH STENT PLACEMENT Right 07/27/2018   Procedure: CYSTOSCOPY WITH STENT PLACEMENT;  Surgeon: Sondra Come, MD;  Location: [facility];  Service: Urology;  Laterality: Right;   CYSTOSCOPY/URETEROSCOPY/HOLMIUM LASER/STENT PLACEMENT Right 08/03/2018   Procedure: CYSTOSCOPY/URETEROSCOPY/HOLMIUM LASER/STENT EXCHANGE **WITH NST BEFORE AND AFTER**;  Surgeon: Vanna Scotland, MD;  Location: [facility];  Service: Urology;  Laterality: Right;   CYSTOSCOPY/URETEROSCOPY/HOLMIUM LASER/STENT PLACEMENT Right 03/02/2021   Procedure: CYSTOSCOPY/URETEROSCOPY/HOLMIUM LASER/STENT PLACEMENT;  Surgeon: Vanna Scotland, MD;  Location: [facility];  Service: Urology;  Laterality: Right;   ESOPHAGOGASTRODUODENOSCOPY (EGD) WITH PROPOFOL N/A 02/18/2022   Procedure: ESOPHAGOGASTRODUODENOSCOPY  (EGD) WITH PROPOFOL;  Surgeon: Toney Reil, MD;  Location: [facility];  Service: Endoscopy;  Laterality: N/A;   EXTRACORPOREAL SHOCK WAVE LITHOTRIPSY Left 02/15/2019   Procedure: EXTRACORPOREAL SHOCK WAVE LITHOTRIPSY (ESWL);  Surgeon: Vanna Scotland, MD;  Location: [facility];  Service: Urology;  Laterality: Left;   none     Family History  Problem Relation Age of Onset   Congestive Heart Failure Maternal Grandfather    Parkinson's disease Paternal Grandmother    Kidney failure Cousin    Kidney cancer Neg Hx    Prostate cancer Neg Hx    Breast cancer Neg Hx    Ovarian cancer Neg Hx    Colon cancer Neg Hx      Exam    Uterus:   12-weeks   Pt informed that the ultrasound is considered a limited OB ultrasound and is not intended to be a complete ultrasound exam.  Patient also informed that the ultrasound is not being completed with the intent of assessing for fetal or placental anomalies or any pelvic abnormalities.  Explained that the purpose of today's ultrasound is to assess for  viability.  Patient acknowledges the purpose of the exam and the limitations of the study.  Ultrasound revealed a viable fetus with fetal heart rate 155 bpm    Assessment:    [name] Patient Active Problem List   Diagnosis Date Noted   Supervision of normal pregnancy 07/22/2023   Atypical chest pain  Abdominal pain, epigastric    Hiatal hernia    Erosive esophagitis    Anxiety 08/26/2020   Internal hemorrhoids 08/18/2016   Status post vacuum-assisted vaginal delivery 03/09/2016   Nephrolithiasis 10/22/2015   Headache, migraine 01/24/2015        Plan:     Initial labs drawn. Prenatal vitamins. Problem list reviewed and updated. Genetic Screening discussed : results reviewed.  Ultrasound discussed; fetal survey: ordered.  Follow up in 4 weeks. 50% of 30 min visit spent on counseling and coordination of care.     [name] 08/23/2023

## 2023-08-25 ENCOUNTER — Encounter: Payer: Self-pay | Admitting: Licensed Practical Nurse

## 2023-09-20 ENCOUNTER — Encounter: Payer: Self-pay | Admitting: Obstetrics & Gynecology

## 2023-09-20 ENCOUNTER — Ambulatory Visit (INDEPENDENT_AMBULATORY_CARE_PROVIDER_SITE_OTHER): Payer: Self-pay | Admitting: Obstetrics & Gynecology

## 2023-09-20 VITALS — BP 105/77 | HR 77 | Wt 210.0 lb

## 2023-09-20 DIAGNOSIS — Z3482 Encounter for supervision of other normal pregnancy, second trimester: Secondary | ICD-10-CM

## 2023-09-20 DIAGNOSIS — Z3A15 15 weeks gestation of pregnancy: Secondary | ICD-10-CM

## 2023-09-20 NOTE — Progress Notes (Signed)
   PRENATAL VISIT NOTE  Subjective:  Evelyn Caldwell is a 33 y.o. G3P2002 at [redacted]w[redacted]d being seen today for ongoing prenatal care.  She is currently monitored for the following issues for this low-risk pregnancy and has Headache, migraine; Nephrolithiasis; Status post vacuum-assisted vaginal delivery; Internal hemorrhoids; Anxiety; Atypical chest pain; Abdominal pain, epigastric; Hiatal hernia; Erosive esophagitis; and Supervision of normal pregnancy on their problem list.  Patient reports no complaints.  Contractions: Not present. Vag. Bleeding: None.  Movement: Present. Denies leaking of fluid.   The following portions of the patient's history were reviewed and updated as appropriate: allergies, current medications, past family history, past medical history, past social history, past surgical history and problem list.   Objective:   Vitals:   09/20/23 1036  BP: 105/77  Pulse: 77  Weight: 210 lb (95.3 kg)    Fetal Status: Fetal Heart Rate (bpm): 156   Movement: Present     General:  Alert, oriented and cooperative. Patient is in no acute distress.  Skin: Skin is warm and dry. No rash noted.   Cardiovascular: Normal heart rate noted  Respiratory: Normal respiratory effort, no problems with respiration noted  Abdomen: Soft, gravid, appropriate for gestational age.  Pain/Pressure: Absent     Pelvic: Cervical exam deferred        Extremities: Normal range of motion.  Edema: None  Mental Status: Normal mood and affect. Normal behavior. Normal judgment and thought content.   Assessment and Plan:  Pregnancy: G3P2002 at [redacted]w[redacted]d 1. [redacted] weeks gestation of pregnancy 2. Encounter for supervision of other normal pregnancy in second trimester (Primary) LR NIPS, declined AFP.  Anatomy scan already scheduled. No other complaints or concerns.  Routine obstetric precautions reviewed.  Please refer to After Visit Summary for other counseling recommendations.   Return in about 4 weeks (around  10/18/2023) for OFFICE OB VISIT (MD or APP).  Future Appointments  Date Time Provider Department Center  10/13/2023  1:00 PM [facility] PROVIDER 1 [facility]  10/13/2023  1:30 PM [facility] US1 [facility]  10/18/2023 11:15 AM Evelyn Caldwell, Kathrine Paris, MD [facility]  11/15/2023 11:15 AM Izell Marsh, MD [facility]  05/14/2024 10:30 AM Artemio Larry, MD [facility] None    Lenoard Rad, MD

## 2023-09-20 NOTE — Progress Notes (Signed)
 ROB  AFP ? Unsure due to being self pay.  CC: None

## 2023-10-06 DIAGNOSIS — O99210 Obesity complicating pregnancy, unspecified trimester: Secondary | ICD-10-CM | POA: Insufficient documentation

## 2023-10-11 NOTE — Telephone Encounter (Signed)
Pt aware, appt scheduled. °

## 2023-10-12 ENCOUNTER — Encounter: Payer: Self-pay | Admitting: Physician Assistant

## 2023-10-12 ENCOUNTER — Ambulatory Visit (INDEPENDENT_AMBULATORY_CARE_PROVIDER_SITE_OTHER): Payer: Self-pay | Admitting: Physician Assistant

## 2023-10-12 VITALS — BP 106/75 | HR 75 | Ht 67.0 in | Wt 213.0 lb

## 2023-10-12 DIAGNOSIS — R109 Unspecified abdominal pain: Secondary | ICD-10-CM

## 2023-10-12 DIAGNOSIS — Z87442 Personal history of urinary calculi: Secondary | ICD-10-CM

## 2023-10-12 LAB — MICROSCOPIC EXAMINATION: Epithelial Cells (non renal): >10 /HPF — AB (ref 0–10)

## 2023-10-12 LAB — URINALYSIS, COMPLETE
Bilirubin, UA: NEGATIVE
Glucose, UA: NEGATIVE
Ketones, UA: NEGATIVE
Nitrite, UA: NEGATIVE
Specific Gravity, UA: 1.025 (ref 1.005–1.030)
Urobilinogen, Ur: 0.2 mg/dL (ref 0.2–1.0)
pH, UA: 6 (ref 5.0–7.5)

## 2023-10-12 MED ORDER — CEPHALEXIN 500 MG PO CAPS: 500.0000 mg | ORAL_CAPSULE | Freq: Two times a day (BID) | ORAL | 0 refills | Status: AC

## 2023-10-12 NOTE — Progress Notes (Signed)
 10/12/2023 10:42 AM   Evelyn Caldwell 1990/11/13 [id number]  CC: Chief Complaint  Patient presents with   Flank Pain   HPI: Evelyn Caldwell is a 33 y.o. pregnant female at [redacted]w[redacted]d with PMH nephrolithiasis who presents today for evaluation of a possible acute stone episode.   I saw her most recently on 08/03/2023 for the same.  UA was bland.  She described intermittent RUQ pain x 1 month.  Renal ultrasound showed bilateral renal stones measuring 7 mm on the right and 6 mm on the left with no hydronephrosis.  Today she reports onset of RUQ pain radiating to the right flank 2 days ago that progressively resolved.  She is asymptomatic this morning.  She denies fever, chills, nausea, or vomiting.  She would ideally like to wait until past the point of fetal viability before pursuing any type of intervention or procedure.  In-office UA today positive for trace protein, trace intact blood, and trace leukocytes; urine microscopy with 6-10 WBCs/HPF, 3-10 RBCs/HPF, >10 epithelial cells/hpf, and moderate bacteria.  PMH: Past Medical History:  Diagnosis Date   Abdominal pain, epigastric    Acne    Anxiety    Atypical chest pain    Erosive esophagitis    GERD (gastroesophageal reflux disease)    Headache, migraine 01/24/2015   Hiatal hernia    History of kidney stones    Internal hemorrhoids 08/18/2016   Kidney stone    Migraine headache    with season changes   Nephrolithiasis 10/22/2015   Renal ultrasound-mild right hydronephrosis likely consistent with physiologic hydronephrosis of pregnancy; no obstruction with bilateral ureteral jets seen; urinalysis notable for 2+ hemoglobin     Status post vacuum-assisted vaginal delivery 03/09/2016   Vulvar lesion     Surgical History: Past Surgical History:  Procedure Laterality Date   CYSTOSCOPY WITH STENT PLACEMENT Right 07/27/2018   Procedure: CYSTOSCOPY WITH STENT PLACEMENT;  Surgeon: Lawerence Pressman, MD;  Location: [facility];  Service: Urology;  Laterality: Right;   CYSTOSCOPY/URETEROSCOPY/HOLMIUM LASER/STENT PLACEMENT Right 08/03/2018   Procedure: CYSTOSCOPY/URETEROSCOPY/HOLMIUM LASER/STENT EXCHANGE **WITH NST BEFORE AND AFTER**;  Surgeon: Dustin Gimenez, MD;  Location: [facility];  Service: Urology;  Laterality: Right;   CYSTOSCOPY/URETEROSCOPY/HOLMIUM LASER/STENT PLACEMENT Right 03/02/2021   Procedure: CYSTOSCOPY/URETEROSCOPY/HOLMIUM LASER/STENT PLACEMENT;  Surgeon: Dustin Gimenez, MD;  Location: [facility];  Service: Urology;  Laterality: Right;   ESOPHAGOGASTRODUODENOSCOPY (EGD) WITH PROPOFOL  N/A 02/18/2022   Procedure: ESOPHAGOGASTRODUODENOSCOPY (EGD) WITH PROPOFOL ;  Surgeon: Selena Daily, MD;  Location: [facility];  Service: Endoscopy;  Laterality: N/A;   EXTRACORPOREAL SHOCK WAVE LITHOTRIPSY Left 02/15/2019   Procedure: EXTRACORPOREAL SHOCK WAVE LITHOTRIPSY (ESWL);  Surgeon: Dustin Gimenez, MD;  Location: [facility];  Service: Urology;  Laterality: Left;   none      Home Medications:  Allergies as of 10/12/2023   No Known Allergies      Medication List        Accurate as of Oct 12, 2023 10:42 AM. If you have any questions, ask your nurse or doctor.          PRENATAL VITAMIN PO Take by mouth.        Allergies:  No Known Allergies  Family History: Family History  Problem Relation Age of Onset   Congestive Heart Failure Maternal Grandfather    Parkinson's disease Paternal Grandmother    Kidney failure Cousin    Kidney cancer Neg Hx    Prostate cancer Neg Hx    Breast cancer Neg Hx  Ovarian cancer Neg Hx    Colon cancer Neg Hx     Social History:   reports that she has never smoked. She has never used smokeless tobacco. She reports current alcohol use. She reports that she does not use drugs.  Physical Exam: BP 106/75   Pulse 75   Ht 5\' 7"  (1.702 m)   Wt 213 lb (96.6 kg)   LMP 06/02/2023   BMI 33.36 kg/m   Constitutional:  Alert and oriented, no acute  distress, nontoxic appearing HEENT: [place], AT Cardiovascular: No clubbing, cyanosis, or edema Respiratory: Normal respiratory effort, no increased work of breathing Skin: No rashes, bruises or suspicious lesions Neurologic: Grossly intact, no focal deficits, moving all 4 extremities Psychiatric: Normal mood and affect  Laboratory Data: Results for orders placed or performed in visit on 10/12/23  Microscopic Examination   Collection Time: 10/12/23 10:15 AM   Urine  Result Value Ref Range   WBC, UA 6-10 (A) 0 - 5 /hpf   RBC, Urine 3-10 (A) 0 - 2 /hpf   Epithelial Cells (non renal) >10 (A) 0 - 10 /hpf   Mucus, UA Present (A) Not Estab.   Bacteria, UA Moderate (A) None seen/Few  Urinalysis, Complete   Collection Time: 10/12/23 10:15 AM  Result Value Ref Range   Specific Gravity, UA 1.025 1.005 - 1.030   pH, UA 6.0 5.0 - 7.5   Color, UA Yellow Yellow   Appearance Ur Clear Clear   Leukocytes,UA Trace (A) Negative   Protein,UA Trace Negative/Trace   Glucose, UA Negative Negative   Ketones, UA Negative Negative   RBC, UA Trace (A) Negative   Bilirubin, UA Negative Negative   Urobilinogen, Ur 0.2 0.2 - 1.0 mg/dL   Nitrite, UA Negative Negative   Microscopic Examination See below:    Assessment & Plan:   1. Flank pain with history of urolithiasis (Primary) Asymptomatic this morning.  UA is mildly positive, though contaminated.  We discussed various options including surveillance versus imaging.  I do not recommend repeat renal ultrasound, as at this point in her pregnancy I would anticipate a degree of physiologic hydronephrosis on the right side.  MRI would have limited diagnostic value.  We discussed we could consider CT stone study, though this carries the risk of radiation exposure for her and the fetus.  That said, I think this test might be warranted if she would want to pursue intervention if it showed a ureteral stone.  We discussed the risks of urologic procedures including  preterm labor or fetal demise.  She would prefer to wait until past the point of viability for any type of procedure or intervention, which is reasonable.  Based on this, using shared decision making we elected to defer imaging unless her symptoms return.  In the meantime, I will treat her empirically for UTI with Keflex . - Urinalysis, Complete - CULTURE, URINE COMPREHENSIVE - cephALEXin  (KEFLEX ) 500 MG capsule; Take 1 capsule (500 mg total) by mouth 2 (two) times daily for 5 days.  Dispense: 10 capsule; Refill: 0   Return if symptoms worsen or fail to improve.  Kathreen Pare, PA-C  [facility] [place] [address], [address] [place], [place] [address] [phone]

## 2023-10-13 ENCOUNTER — Other Ambulatory Visit: Payer: Self-pay | Admitting: *Deleted

## 2023-10-13 ENCOUNTER — Ambulatory Visit: Payer: Self-pay | Attending: Obstetrics and Gynecology

## 2023-10-13 ENCOUNTER — Ambulatory Visit: Payer: Self-pay | Admitting: Maternal & Fetal Medicine

## 2023-10-13 ENCOUNTER — Encounter: Payer: Self-pay | Admitting: Obstetrics and Gynecology

## 2023-10-13 VITALS — BP 121/66 | HR 88

## 2023-10-13 DIAGNOSIS — Z362 Encounter for other antenatal screening follow-up: Secondary | ICD-10-CM

## 2023-10-13 DIAGNOSIS — Z3A19 19 weeks gestation of pregnancy: Secondary | ICD-10-CM

## 2023-10-13 DIAGNOSIS — O99212 Obesity complicating pregnancy, second trimester: Secondary | ICD-10-CM | POA: Insufficient documentation

## 2023-10-13 DIAGNOSIS — E669 Obesity, unspecified: Secondary | ICD-10-CM

## 2023-10-13 DIAGNOSIS — O99210 Obesity complicating pregnancy, unspecified trimester: Secondary | ICD-10-CM

## 2023-10-13 DIAGNOSIS — O321XX0 Maternal care for breech presentation, not applicable or unspecified: Secondary | ICD-10-CM | POA: Insufficient documentation

## 2023-10-13 DIAGNOSIS — Z3689 Encounter for other specified antenatal screening: Secondary | ICD-10-CM | POA: Insufficient documentation

## 2023-10-13 DIAGNOSIS — N200 Calculus of kidney: Secondary | ICD-10-CM | POA: Insufficient documentation

## 2023-10-13 DIAGNOSIS — Z3481 Encounter for supervision of other normal pregnancy, first trimester: Secondary | ICD-10-CM | POA: Insufficient documentation

## 2023-10-13 NOTE — Progress Notes (Signed)
   Patient information  Patient Name: Evelyn Caldwell [facility]  Patient MRN:   [id number]  Referring practice: [facility] Referring Provider: [facility]  MFM CONSULT  Evelyn Caldwell is a 33 y.o. G3P2002 at [redacted]w[redacted]d here for ultrasound and consultation. Patient Active Problem List   Diagnosis Date Noted   Obesity affecting pregnancy, antepartum 10/06/2023   Supervision of normal pregnancy 07/22/2023   Evelyn Caldwell has a pregnancy with the complications mentioned in the problem list. During today's visit we focused on the following concerns:   The patient is doing well today without complaint. She denies any family or personal history of genetic or structural disorders. I discussed the US  findings and inability to visualize some structures due to fetal position.   Sonographic findings Single intrauterine pregnancy at 19w 0d  Fetal cardiac activity:  Observed and appears normal. Presentation: Breech. The anatomic structures that were well seen appear normal without evidence of soft markers. Due to poor acoustic windows some structures remain suboptimally visualized. Fetal biometry shows the estimated fetal weight at the 69 percentile.  Amniotic fluid: Within normal limits.  MVP: 5.11 cm. Placenta: Anterior. Adnexa: No abnormality visualized. Cervical length: 4.5 cm.  There are limitations of prenatal ultrasound such as the inability to detect certain abnormalities due to poor visualization. Various factors such as fetal position, gestational age and maternal body habitus may increase the difficulty in visualizing the fetal anatomy.    Recommendations -EDD should be 03/08/2024 based on LMP (06/02/23). -Detailed ultrasound was done today without abnormalities. -Baseline preeclampsia labs: CMP, CBC and urine protein/creatinine ratio if not previously completed. -Continue Aspirin 81 mg for preeclampsia prophylaxis. -Follow-up anatomy and fetal growth in 4 to  6 weeks. -Continue routine prenatal care with referring OB provider.  Review of Systems: A review of systems was performed and was negative except per HPI   Vitals and Physical Exam    10/13/2023    1:07 PM 10/12/2023   10:28 AM 09/20/2023   10:36 AM  Vitals with BMI  Height  5\' 7"    Weight  213 lbs 210 lbs  BMI  33.35   Systolic 121 106 409  Diastolic 66 75 77  Pulse 88 75 77    Sitting comfortably on the sonogram table Nonlabored breathing Normal rate and rhythm Abdomen is nontender  Past pregnancies OB History  Gravida Para Term Preterm AB Living  3 2 2   2   SAB IAB Ectopic Multiple Live Births     0 2    # Outcome Date GA Lbr Len/2nd Weight Sex Type Anes PTL Lv  3 Current           2 Term 10/18/18 [redacted]w[redacted]d 06:30 / 01:49 8 lb 12.4 oz (3.98 kg) F Vag-Spont EPI  LIV  1 Term 03/09/16 [redacted]w[redacted]d  8 lb 1.6 oz (3.674 kg) F Vag-Spont   LIV    I spent 30 minutes reviewing the patients chart, including labs and images as well as counseling the patient about her medical conditions. Greater than 50% of the time was spent in direct face-to-face patient counseling.  Evelyn Bowling, DO Maternal fetal medicine,    10/13/2023  3:24 PM

## 2023-10-14 ENCOUNTER — Telehealth: Payer: Self-pay

## 2023-10-14 NOTE — Telephone Encounter (Signed)
 Left message for pt to call office back regarding anatomy scan dates. Pt is requesting certain dates but are unavailable at [facility] in [place] / [place].

## 2023-10-18 ENCOUNTER — Ambulatory Visit: Payer: Self-pay | Admitting: Obstetrics & Gynecology

## 2023-10-18 ENCOUNTER — Encounter: Payer: Self-pay | Admitting: Obstetrics & Gynecology

## 2023-10-18 VITALS — BP 110/71 | HR 86 | Wt 215.2 lb

## 2023-10-18 DIAGNOSIS — Z3A19 19 weeks gestation of pregnancy: Secondary | ICD-10-CM

## 2023-10-18 DIAGNOSIS — Z3482 Encounter for supervision of other normal pregnancy, second trimester: Secondary | ICD-10-CM

## 2023-10-18 LAB — CULTURE, URINE COMPREHENSIVE

## 2023-10-18 NOTE — Progress Notes (Signed)
 PRENATAL VISIT NOTE  Subjective:  Evelyn Caldwell is a 33 y.o. G3P2002 at [redacted]w[redacted]d being seen today for ongoing prenatal care.  She is currently monitored for the following issues for this low-risk pregnancy and has Supervision of normal pregnancy and Obesity affecting pregnancy, antepartum on their problem list.  Patient reports no complaints.  Contractions: Not present. Vag. Bleeding: None.  Movement: Present (Placenta in the front). Denies leaking of fluid.   The following portions of the patient's history were reviewed and updated as appropriate: allergies, current medications, past family history, past medical history, past social history, past surgical history and problem list.   Objective:    Vitals:   10/18/23 1107  BP: 110/71  Pulse: 86  Weight: 215 lb 3.2 oz (97.6 kg)    Fetal Status:  Fetal Heart Rate (bpm): 143   Movement: Present (Placenta in the front)    General: Alert, oriented and cooperative. Patient is in no acute distress.  Skin: Skin is warm and dry. No rash noted.   Cardiovascular: Normal heart rate noted  Respiratory: Normal respiratory effort, no problems with respiration noted  Abdomen: Soft, gravid, appropriate for gestational age.  Pain/Pressure: Absent     Pelvic: Cervical exam deferred        Extremities: Normal range of motion.  Edema: None  Mental Status: Normal mood and affect. Normal behavior. Normal judgment and thought content.   US  MFM OB DETAIL +14 WK Result Date: 10/13/2023 ----------------------------------------------------------------------  OBSTETRICS REPORT                       (Signed Final 10/13/2023 03:33 pm) ---------------------------------------------------------------------- Patient Info  ID #:       [id number]                          D.O.B.:  [birth year] (32 yrs)(F)  Name:       Evelyn Caldwell                  Visit Date: 10/13/2023 01:16 pm              [place]  ---------------------------------------------------------------------- Performed By  Attending:        Penney Bowling DO       Ref. Address:     [address]                                                             [address]  Performed By:     Joyann Allen RDMS      [facility]  Referred By:      [facility] Roda Cirri ---------------------------------------------------------------------- Orders  #  Description                           Code        Ordered By  1  US  MFM OB DETAIL +14 WK               [id number]    PEGGY CONSTANT ----------------------------------------------------------------------  #  Order #                     Accession #                Episode #  1  [id number]                   [id number]                 [id number] ---------------------------------------------------------------------- Indications  Obesity complicating pregnancy, second         O99.212  trimester (BMI 34)  Fetal abnormality - other known or             O35.8XX0  suspected (specify)  [redacted] weeks gestation of pregnancy                Z3A.19  Encounter for antenatal screening for          Z36.3  malformations  Low Risk NIPS  declined AFP  Medical complication of pregnancy              O26.90  (nephrolithiasis) ---------------------------------------------------------------------- Vital Signs  BP:          121/66 ---------------------------------------------------------------------- Fetal Evaluation  Num Of Fetuses:         1  Fetal Heart Rate(bpm):  141  Cardiac Activity:       Observed  Presentation:           Breech  Placenta:               Anterior  P. Cord Insertion:      Visualized, central  Amniotic Fluid  AFI FV:      Within normal limits                              Largest Pocket(cm)                               5.11 ---------------------------------------------------------------------- Biometry  BPD:      43.1  mm     G. Age:  19w 0d         52  %    CI:         72.5   %    70 - 86  FL/HC:      18.4   %    16.1 - 18.3  HC:       161   mm     G. Age:  18w 6d         38  %    HC/AC:      1.12        1.09 - 1.39  AC:      143.9  mm     G. Age:  19w 5d         70  %    FL/BPD:     68.9   %  FL:       29.7  mm     G. Age:  19w 1d         49  %    FL/AC:      20.6   %    20 - 24  CER:      19.6  mm     G. Age:  19w 0d         36  %  LV:        5.9  mm  CM:          3  mm  Est. FW:     290  gm    0 lb 10 oz      69  % ---------------------------------------------------------------------- OB History  Gravidity:    3         Term:   2  Living:       2 ---------------------------------------------------------------------- Gestational Age  LMP:           19w 0d        Date:  06/02/23                   EDD:   03/08/24  U/S Today:     19w 1d                                        EDD:   03/07/24  Best:          19w 0d     Det. By:  LMP  (06/02/23)          EDD:   03/08/24 ---------------------------------------------------------------------- Targeted Anatomy  Central Nervous System  Calvarium/Cranial V.:  Appears normal         Cereb./Vermis:          Appears normal  Cavum:                 Appears normal         Cisterna Magna:         Appears normal  Lateral Ventricles:    Appears normal         Midline Falx:           Appears normal  Choroid Plexus:        Appears normal  Spine  Cervical:              Appears normal         Sacral:                 Not well visualized  Thoracic:              Not well visualized    Shape/Curvature:        Not  well visualized  Lumbar:                Not well visualized  Head/Neck  Lips:                  Appears normal         Profile:                Appears normal  Neck:                  Appears normal         Orbits/Eyes:            Appears  normal  Nuchal Fold:           Appears normal         Mandible:               Appears normal  Nasal Bone:            Present                Maxilla:                Appears normal  Thorax  4 Chamber View:        Appears normal         Interventr. Septum:     Appears normal  Cardiac Rhythm:        Normal                 Cardiac Axis:           Normal  Cardiac Situs:         Appears normal         Diaphragm:              Appears normal  Rt Outflow Tract:      Appears normal         3 Vessel View:          Appears normal  Lt Outflow Tract:      Appears normal         3 V Trachea View:       Appears normal  Aortic Arch:           Appears normal         IVC:                    Appears normal  Ductal Arch:           Appears normal         Crossing:               Appears normal  SVC:                   Appears normal  Abdomen  Ventral Wall:          Appears normal         Lt Kidney:              Appears normal  Cord Insertion:        Appears normal         Rt Kidney:              Appears normal  Situs:                 Appears normal         Bladder:  Appears normal  Stomach:               Appears normal  Extremities  Lt Humerus:            Appears normal         Lt Femur:               Appears normal  Rt Humerus:            Appears normal         Rt Femur:               Appears normal  Lt Forearm:            Appears normal         Lt Lower Leg:           Appears normal  Rt Forearm:            Appears normal         Rt Lower Leg:           Appears normal  Lt Hand:               Open hand nml          Lt Foot:                Nml heel/foot  Rt Hand:               Open hand nml          Rt Foot:                Nml heel/foot  Other  Umbilical Cord:        Normal 3-vessel        Genitalia:              Female-nml ---------------------------------------------------------------------- Cervix Uterus Adnexa  Cervix  Length:            4.5  cm.  Normal appearance by transabdominal scan  Uterus  No abnormality visualized.   Right Ovary  Size(cm)     2.91   x   2.27   x  2.38      Vol(ml): 8.23  Within normal limits.  Left Ovary  Size(cm)     3.42   x    2     x  2.68      Vol(ml): 9.6  Within normal limits.  Cul De Sac  No free fluid seen.  Adnexa  No abnormality visualized ---------------------------------------------------------------------- Comments  Evelyn Caldwell has a pregnancy with the  complications mentioned in the problem list. During today's  visit we focused on the following concerns:  The patient is doing well today without complaint. She denies  any family or personal history of genetic or structural  disorders. I discussed the US  findings and inability to  visualize some structures due to fetal position.  Sonographic findings  Single intrauterine pregnancy at 19w 0d  Fetal cardiac activity:  Observed and appears normal.  Presentation: Breech.  The anatomic structures that were well seen appear normal  without evidence of soft markers. Due to poor acoustic  windows some structures remain suboptimally visualized.  Fetal biometry shows the estimated fetal weight at the 69  percentile.  Amniotic fluid: Within normal limits.  MVP: 5.11 cm.  Placenta: Anterior.  Adnexa: No abnormality visualized.  Cervical length: 4.5 cm.  There are limitations of prenatal ultrasound such as the  inability to detect certain  abnormalities due to poor  visualization. Various factors such as fetal position,  gestational age and maternal body habitus may increase the  difficulty in visualizing the fetal anatomy.  Recommendations  -EDD should be 03/08/2024 based on LMP (06/02/23).  -Detailed ultrasound was done today without abnormalities.  -Baseline preeclampsia labs: CMP, CBC and urine  protein/creatinine ratio if not previously completed.  -Continue Aspirin 81 mg for preeclampsia prophylaxis.  -Follow-up anatomy and fetal growth in 4 to 6 weeks.  -Continue routine prenatal care with referring OB provider.  ----------------------------------------------------------------------                  Penney Bowling, DO Electronically Signed Final Report   10/13/2023 03:33 pm ----------------------------------------------------------------------    Assessment and Plan:  Pregnancy: G3P2002 at [redacted]w[redacted]d 1. [redacted] weeks gestation of pregnancy 2. Encounter for supervision of other normal pregnancy in second trimester (Primary) Reassured about anatomy scan, will follow up repeat scan. Offered AFP screen, she declined. Second  trimester expectations reviewed and all questions answered. Preterm labor symptoms and general obstetric precautions including but not limited to vaginal bleeding, contractions, leaking of fluid and fetal movement were reviewed in detail with the patient. Please refer to After Visit Summary for other counseling recommendations.   Return in about 4 weeks (around 11/15/2023) for OFFICE OB VISIT (MD or APP).  Future Appointments  Date Time Provider Department Center  11/15/2023 11:15 AM Izell Marsh, MD [facility]  11/21/2023  8:30 AM [facility] CONSULT RM [facility] None  11/21/2023  9:00 AM [facility] US1 [facility]  12/13/2023  8:00 AM [facility] LAB [facility]  12/13/2023  8:35 AM Siren Porrata, Kathrine Paris, MD [facility]  05/14/2024 10:30 AM Artemio Larry, MD [facility] None    Lenoard Rad, MD

## 2023-10-25 ENCOUNTER — Encounter: Payer: Self-pay | Admitting: Obstetrics & Gynecology

## 2023-11-15 ENCOUNTER — Telehealth: Payer: Self-pay | Admitting: Obstetrics and Gynecology

## 2023-11-15 ENCOUNTER — Encounter: Payer: Self-pay | Admitting: Obstetrics and Gynecology

## 2023-11-15 DIAGNOSIS — Z6833 Body mass index (BMI) 33.0-33.9, adult: Secondary | ICD-10-CM

## 2023-11-15 DIAGNOSIS — Z3009 Encounter for other general counseling and advice on contraception: Secondary | ICD-10-CM | POA: Insufficient documentation

## 2023-11-15 DIAGNOSIS — Z3A23 23 weeks gestation of pregnancy: Secondary | ICD-10-CM

## 2023-11-15 DIAGNOSIS — Z3482 Encounter for supervision of other normal pregnancy, second trimester: Secondary | ICD-10-CM

## 2023-11-15 NOTE — Patient Instructions (Signed)
 It was nice meeting you today! We will do your diabetes testing next appointment. Please do not eat or drink anything besides water  before that appointment.

## 2023-11-15 NOTE — Progress Notes (Signed)
    TELEHEALTH OBSTETRICS VISIT ENCOUNTER NOTE  Provider location: [facility]   Patient location: Home  I connected with Evelyn Caldwell on 11/15/23 at 11:15 AM EDT by telephone at home and verified that I am speaking with the correct person using two identifiers. Of note, unable to do video encounter due to technical difficulties.    I discussed the limitations, risks, security and privacy concerns of performing an evaluation and management service by telephone and the availability of in person appointments. I also discussed with the patient that there may be a patient responsible charge related to this service. The patient expressed understanding and agreed to proceed.  Subjective:  Evelyn Caldwell is a 33 y.o. G3P2002 at [redacted]w[redacted]d being followed for ongoing prenatal care.  She is currently monitored for the following issues for this low-risk pregnancy and has Supervision of normal pregnancy and Obesity affecting pregnancy, antepartum on their problem list.  Patient reports no complaints. Reports fetal movement. Denies any contractions, bleeding or leaking of fluid.   The following portions of the patient's history were reviewed and updated as appropriate: allergies, current medications, past family history, past medical history, past social history, past surgical history and problem list.   Objective:  Last menstrual period 06/02/2023. General:  Alert, oriented and cooperative.   Mental Status: Normal mood and affect perceived. Normal judgment and thought content.  Rest of physical exam deferred due to type of encounter  Assessment and Plan:  Pregnancy: G3P2002 at [redacted]w[redacted]d 1. Encounter for supervision of other normal pregnancy in second trimester (Primary) 2. [redacted] weeks gestation of pregnancy Follow up anatomy 6/23 Discussed fasting prior to next visit for 2h GTT, CBC, HIV, RPR & tdap  3. BMI 33.0-33.9,adult Declines ldASA  4. Desires  BTL Self pay - will send mychart message with self pay info  I discussed the assessment and treatment plan with the patient. The patient was provided an opportunity to ask questions and all were answered. The patient agreed with the plan and demonstrated an understanding of the instructions. The patient was advised to call back or seek an in-person office evaluation/go to MAU at [facility] for any urgent or concerning symptoms. Please refer to After Visit Summary for other counseling recommendations.   I provided 10 minutes of non-face-to-face time during this encounter.  Return in about 4 weeks (around 12/13/2023) for return OB at 28 weeks - please do not eat or drink anything besides water  before your next appointme.  Future Appointments  Date Time Provider Department Center  11/21/2023  8:30 AM [facility] CONSULT RM [facility] None  11/21/2023  9:00 AM [facility] US1 [facility]  12/13/2023  8:00 AM [facility] LAB [facility]  12/13/2023  8:35 AM Anyanwu, Kathrine Paris, MD [facility]  05/14/2024 10:30 AM Artemio Larry, MD [facility] None    Izell Marsh, MD Center for [facility], [facility]

## 2023-11-15 NOTE — Progress Notes (Signed)
 ROB

## 2023-11-17 ENCOUNTER — Ambulatory Visit: Payer: Self-pay

## 2023-11-17 ENCOUNTER — Encounter: Payer: Self-pay | Admitting: Obstetrics and Gynecology

## 2023-11-21 ENCOUNTER — Other Ambulatory Visit: Payer: Self-pay

## 2023-11-21 ENCOUNTER — Ambulatory Visit (HOSPITAL_BASED_OUTPATIENT_CLINIC_OR_DEPARTMENT_OTHER): Payer: Self-pay | Admitting: Obstetrics and Gynecology

## 2023-11-21 ENCOUNTER — Ambulatory Visit: Payer: Self-pay | Attending: Obstetrics and Gynecology

## 2023-11-21 VITALS — BP 111/63 | HR 85

## 2023-11-21 DIAGNOSIS — N200 Calculus of kidney: Secondary | ICD-10-CM

## 2023-11-21 DIAGNOSIS — Z3A24 24 weeks gestation of pregnancy: Secondary | ICD-10-CM | POA: Insufficient documentation

## 2023-11-21 DIAGNOSIS — O99212 Obesity complicating pregnancy, second trimester: Secondary | ICD-10-CM | POA: Insufficient documentation

## 2023-11-21 DIAGNOSIS — Z362 Encounter for other antenatal screening follow-up: Secondary | ICD-10-CM | POA: Insufficient documentation

## 2023-11-21 DIAGNOSIS — E669 Obesity, unspecified: Secondary | ICD-10-CM

## 2023-11-21 DIAGNOSIS — Z87442 Personal history of urinary calculi: Secondary | ICD-10-CM | POA: Insufficient documentation

## 2023-11-21 DIAGNOSIS — O26832 Pregnancy related renal disease, second trimester: Secondary | ICD-10-CM

## 2023-11-21 DIAGNOSIS — O2690 Pregnancy related conditions, unspecified, unspecified trimester: Secondary | ICD-10-CM | POA: Insufficient documentation

## 2023-11-21 DIAGNOSIS — O99891 Other specified diseases and conditions complicating pregnancy: Secondary | ICD-10-CM

## 2023-11-21 NOTE — Progress Notes (Signed)
 Maternal-Fetal Medicine   Name: Evelyn Caldwell MRN: [id number] G3 P2002 at 24w 4d gestation.  Patient is here for completion of fetal anatomy.  Obstetrical history significant for 2 term vaginal deliveries.  In March 2025, patient had renal ultrasound following flank pain.  She has small renal stones in both kidneys (7 mm and 6 mm).  Patient does not have any flank pain now. She had ureteric stent placement and removal of stone in her previous pregnancy. She is planning bilateral tubal sterilization.  Patient is not taking aspirin.  She does not have risk factors for preeclampsia.  Ultrasound Amniotic fluid is normal good fetal activity seen.  Fetal growth is appropriate for gestational age.  Fetal anatomical survey was completed and appears normal.  Fetal spine appears normal.  I reassured the patient of the findings. We discussed renal calculus in pregnancy.  I encouraged her to increase her oral hydration that may lead to the calculus being flushed out.  I counseled the patient that fluoroscopy for procedures including nephrostomy is safe in pregnancy. She does not have any other risk factors.  Recommendations - Follow-up scans as clinically indicated. Consultation including face-to-face counseling (more than 50% of time spent) is 10 minutes.

## 2023-12-13 ENCOUNTER — Ambulatory Visit: Payer: Self-pay

## 2023-12-13 ENCOUNTER — Other Ambulatory Visit: Payer: Self-pay

## 2023-12-13 ENCOUNTER — Ambulatory Visit (INDEPENDENT_AMBULATORY_CARE_PROVIDER_SITE_OTHER): Payer: Self-pay | Admitting: Obstetrics & Gynecology

## 2023-12-13 ENCOUNTER — Encounter: Payer: Self-pay | Admitting: Obstetrics & Gynecology

## 2023-12-13 VITALS — BP 96/65 | HR 81 | Wt 216.0 lb

## 2023-12-13 DIAGNOSIS — Z3482 Encounter for supervision of other normal pregnancy, second trimester: Secondary | ICD-10-CM

## 2023-12-13 DIAGNOSIS — Z3A27 27 weeks gestation of pregnancy: Secondary | ICD-10-CM

## 2023-12-13 DIAGNOSIS — Z23 Encounter for immunization: Secondary | ICD-10-CM

## 2023-12-13 NOTE — Progress Notes (Signed)
   PRENATAL VISIT NOTE  Subjective:  Evelyn Caldwell is a 33 y.o. G3P2002 at [redacted]w[redacted]d being seen today for ongoing prenatal care.  She is currently monitored for the following issues for this low-risk pregnancy and has Supervision of normal pregnancy; Obesity affecting pregnancy, antepartum; and Consultation for female sterilization on their problem list.  Patient reports no complaints.  Contractions: Irritability. Vag. Bleeding: None.  Movement: Present. Denies leaking of fluid.   The following portions of the patient's history were reviewed and updated as appropriate: allergies, current medications, past family history, past medical history, past social history, past surgical history and problem list.   Objective:    Vitals:   12/13/23 0820  BP: 96/65  Pulse: 81  Weight: 216 lb (98 kg)    Fetal Status:  Fetal Heart Rate (bpm): 138 Fundal Height: 28 cm Movement: Present    General: Alert, oriented and cooperative. Patient is in no acute distress.  Skin: Skin is warm and dry. No rash noted.   Cardiovascular: Normal heart rate noted  Respiratory: Normal respiratory effort, no problems with respiration noted  Abdomen: Soft, gravid, appropriate for gestational age.  Pain/Pressure: Absent     Pelvic: Cervical exam deferred        Extremities: Normal range of motion.  Edema: None  Mental Status: Normal mood and affect. Normal behavior. Normal judgment and thought content.   Assessment and Plan:  Pregnancy: G3P2002 at [redacted]w[redacted]d 1. Need for Tdap vaccination - Tdap vaccine greater than or equal to 7yo IM  2. [redacted] weeks gestation of pregnancy 3. Encounter for supervision of other normal pregnancy in second trimester (Primary) GTT, CBC, HIV, RPR checked today, will follow up results and manage accordingly. Third trimester expectations reviewed and all questions answered. Preterm labor symptoms and general obstetric precautions including but not limited to vaginal bleeding, contractions,  leaking of fluid and fetal movement were reviewed in detail with the patient. Please refer to After Visit Summary for other counseling recommendations.   Return in about 2 weeks (around 12/27/2023) for OB VISIT (MD or APP) - virtual as per patient request.  Future Appointments  Date Time Provider Department Center  12/29/2023  8:35 AM Fredirick Glenys RAMAN, MD [facility]  01/11/2024 10:55 AM Emilio Delilah HERO, CNM [facility]  01/26/2024 10:35 AM Herchel, Gloris LABOR, MD [facility]  02/09/2024 10:35 AM Cully Luckow, Gloris LABOR, MD [facility]  02/16/2024 10:35 AM Fredirick Glenys RAMAN, MD [facility]  02/23/2024 10:35 AM Tynisha Ogan, Gloris LABOR, MD [facility]  05/14/2024 10:30 AM Jackquline Sawyer, MD [facility] None    Gloris Herchel, MD

## 2023-12-13 NOTE — Patient Instructions (Signed)

## 2023-12-14 ENCOUNTER — Ambulatory Visit: Payer: Self-pay | Admitting: Obstetrics & Gynecology

## 2023-12-14 DIAGNOSIS — Z3482 Encounter for supervision of other normal pregnancy, second trimester: Secondary | ICD-10-CM

## 2023-12-14 LAB — CBC
Hematocrit: 32.9 % — ABNORMAL LOW (ref 34.0–46.6)
Hemoglobin: 10.7 g/dL — ABNORMAL LOW (ref 11.1–15.9)
MCH: 29.1 pg (ref 26.6–33.0)
MCHC: 32.5 g/dL (ref 31.5–35.7)
MCV: 89 fL (ref 79–97)
Platelets: 212 x10E3/uL (ref 150–450)
RBC: 3.68 x10E6/uL — ABNORMAL LOW (ref 3.77–5.28)
RDW: 13.3 % (ref 11.7–15.4)
WBC: 5.8 x10E3/uL (ref 3.4–10.8)

## 2023-12-14 LAB — GLUCOSE TOLERANCE, 2 HOURS W/ 1HR
Glucose, 1 hour: 127 mg/dL (ref 70–179)
Glucose, 2 hour: 105 mg/dL (ref 70–152)
Glucose, Fasting: 87 mg/dL (ref 70–91)

## 2023-12-14 LAB — RPR: RPR Ser Ql: NONREACTIVE

## 2023-12-14 LAB — HIV ANTIBODY (ROUTINE TESTING W REFLEX): HIV Screen 4th Generation wRfx: NONREACTIVE

## 2023-12-29 ENCOUNTER — Telehealth: Payer: Self-pay | Admitting: Family Medicine

## 2023-12-29 VITALS — BP 108/70 | HR 80

## 2023-12-29 DIAGNOSIS — Z3483 Encounter for supervision of other normal pregnancy, third trimester: Secondary | ICD-10-CM

## 2023-12-29 DIAGNOSIS — Z3A30 30 weeks gestation of pregnancy: Secondary | ICD-10-CM

## 2023-12-29 NOTE — Progress Notes (Signed)
 Rob: Evelyn Caldwell

## 2023-12-29 NOTE — Progress Notes (Signed)
   OBSTETRICS PRENATAL VIRTUAL VISIT ENCOUNTER NOTE  Provider location: [facility]   Patient location: Home  I connected with Evelyn Caldwell on 12/29/23 at  8:35 AM EDT by MyChart Video Encounter and verified that I am speaking with the correct person using two identifiers. I discussed the limitations, risks, security and privacy concerns of performing an evaluation and management service virtually and the availability of in person appointments. I also discussed with the patient that there may be a patient responsible charge related to this service. The patient expressed understanding and agreed to proceed. Subjective:  Evelyn Caldwell is a 33 y.o. G3P2002 at [redacted]w[redacted]d being seen today for ongoing prenatal care.  She is currently monitored for the following issues for this low-risk pregnancy and has Supervision of normal pregnancy; Obesity affecting pregnancy, antepartum; and Consultation for female sterilization on their problem list.  Patient reports no complaints.  Contractions: Irregular. Vag. Bleeding: None.  Movement: Present. Denies any leaking of fluid.   The following portions of the patient's history were reviewed and updated as appropriate: allergies, current medications, past family history, past medical history, past social history, past surgical history and problem list.   Objective:    Vitals:   12/29/23 0816  BP: 108/70  Pulse: 80    Fetal Status:      Movement: Present    General: Alert, oriented and cooperative. Patient is in no acute distress.  Respiratory: Normal respiratory effort, no problems with respiration noted  Mental Status: Normal mood and affect. Normal behavior. Normal judgment and thought content.  Rest of physical exam deferred due to type of encounter  Imaging: No results found.  Assessment and Plan:  Pregnancy: G3P2002 at [redacted]w[redacted]d 1. Encounter for supervision of other normal pregnancy in third  trimester (Primary) Continue routine prenatal care. Having few BH contractions  2. [redacted] weeks gestation of pregnancy   Preterm labor symptoms and general obstetric precautions including but not limited to vaginal bleeding, contractions, leaking of fluid and fetal movement were reviewed in detail with the patient. I discussed the assessment and treatment plan with the patient. The patient was provided an opportunity to ask questions and all were answered. The patient agreed with the plan and demonstrated an understanding of the instructions. The patient was advised to call back or seek an in-person office evaluation/go to MAU at [facility] for any urgent or concerning symptoms. Please refer to After Visit Summary for other counseling recommendations.   I provided 6 minutes of face-to-face time during this encounter.  Return in 2 weeks (on 01/12/2024).  Future Appointments  Date Time Provider Department Center  12/29/2023  8:35 AM Fredirick Glenys RAMAN, MD [facility]  01/11/2024 10:55 AM Emilio Delilah HERO, CNM [facility]  01/26/2024 10:35 AM Herchel, Gloris LABOR, MD [facility]  02/09/2024 10:35 AM Herchel, Gloris LABOR, MD [facility]  02/16/2024 10:35 AM Fredirick Glenys RAMAN, MD [facility]  02/23/2024 10:35 AM Herchel, Gloris LABOR, MD [facility]  05/14/2024 10:30 AM Jackquline Sawyer, MD [facility] None    Glenys RAMAN Fredirick, MD Center for [facility], [facility]

## 2024-01-11 ENCOUNTER — Ambulatory Visit: Payer: Self-pay | Admitting: Certified Nurse Midwife

## 2024-01-11 VITALS — BP 114/73 | HR 90 | Wt 215.0 lb

## 2024-01-11 DIAGNOSIS — Z3483 Encounter for supervision of other normal pregnancy, third trimester: Secondary | ICD-10-CM

## 2024-01-11 DIAGNOSIS — R300 Dysuria: Secondary | ICD-10-CM

## 2024-01-11 DIAGNOSIS — Z3493 Encounter for supervision of normal pregnancy, unspecified, third trimester: Secondary | ICD-10-CM

## 2024-01-11 DIAGNOSIS — Z3A31 31 weeks gestation of pregnancy: Secondary | ICD-10-CM

## 2024-01-11 LAB — POCT URINALYSIS DIPSTICK
Bilirubin, UA: NEGATIVE
Glucose, UA: NEGATIVE
Ketones, UA: NEGATIVE
Nitrite, UA: NEGATIVE
Protein, UA: POSITIVE — AB
Spec Grav, UA: 1.02 (ref 1.010–1.025)
Urobilinogen, UA: 0.2 U/dL
pH, UA: 6.5 (ref 5.0–8.0)

## 2024-01-11 NOTE — Progress Notes (Signed)
 ROB   CC: pt believes she may be passing small kidney stones.pt has a picture.

## 2024-01-11 NOTE — Progress Notes (Signed)
   PRENATAL VISIT NOTE  Subjective:  Evelyn Caldwell is a 33 y.o. G3P2002 at [redacted]w[redacted]d being seen today for ongoing prenatal care.  She is currently monitored for the following issues for this low-risk pregnancy and has Supervision of normal pregnancy; Obesity affecting pregnancy, antepartum; and Consultation for female sterilization on their problem list.  Patient reports no complaints. Reports passing several kidney stones this morning. Denies any pain or blood.   Contractions: Irritability. Vag. Bleeding: None.  Movement: Present. Denies leaking of fluid.   The following portions of the patient's history were reviewed and updated as appropriate: allergies, current medications, past family history, past medical history, past social history, past surgical history and problem list.   Objective:    Vitals:   01/11/24 1104  BP: 114/73  Pulse: 90  Weight: 215 lb (97.5 kg)    Fetal Status:  Fetal Heart Rate (bpm): 142 Fundal Height: 34 cm Movement: Present    General: Alert, oriented and cooperative. Patient is in no acute distress.  Skin: Skin is warm and dry. No rash noted.   Cardiovascular: Normal heart rate noted  Respiratory: Normal respiratory effort, no problems with respiration noted  Abdomen: Soft, gravid, appropriate for gestational age.  Pain/Pressure: Present     Pelvic: Cervical exam deferred        Extremities: Normal range of motion.  Edema: None  Mental Status: Normal mood and affect. Normal behavior. Normal judgment and thought content.   Assessment and Plan:  Pregnancy: G3P2002 at [redacted]w[redacted]d 1. Encounter for supervision of low-risk pregnancy in third trimester (Primary) - Patient doing well.  - Reports positive fetal movement. States that fetus has a pattern for moving that is different from her girls. She states that baby is moving for his normal.   2. Dysuria - Small amount of blood and bacteria noted. Since for culture but likely related to the passing of Kidney  stones this morning.  - Culture, OB Urine - POCT Urinalysis Dipstick  3. [redacted] weeks gestation of pregnancy - Fundal height slightly over appropriateness for gestational age however fetus was stretching out during exam.  - Continue to monitor FH   Preterm labor symptoms and general obstetric precautions including but not limited to vaginal bleeding, contractions, leaking of fluid and fetal movement were reviewed in detail with the patient. Please refer to After Visit Summary for other counseling recommendations.   Return in about 2 weeks (around 01/25/2024) for LOB.  Future Appointments  Date Time Provider Department Center  01/26/2024 10:35 AM Anyanwu, Gloris LABOR, MD [facility]  02/09/2024 10:35 AM Anyanwu, Gloris LABOR, MD [facility]  02/16/2024 10:35 AM Fredirick Glenys RAMAN, MD [facility]  02/23/2024 10:35 AM Herchel, Gloris LABOR, MD [facility]  05/14/2024 10:30 AM Jackquline Sawyer, MD [facility] None    Han Vejar Erven) Emilio, MSN, CNM  Center for [facility]  01/11/2024 11:45 AM

## 2024-01-13 LAB — URINE CULTURE, OB REFLEX

## 2024-01-13 LAB — CULTURE, OB URINE

## 2024-01-23 ENCOUNTER — Encounter: Payer: Self-pay | Admitting: Obstetrics & Gynecology

## 2024-01-24 ENCOUNTER — Other Ambulatory Visit: Payer: Self-pay | Admitting: *Deleted

## 2024-01-24 MED ORDER — PANTOPRAZOLE SODIUM 40 MG PO TBEC: 40.0000 mg | DELAYED_RELEASE_TABLET | Freq: Every day | ORAL | 2 refills | Status: DC

## 2024-01-26 ENCOUNTER — Ambulatory Visit: Payer: Self-pay | Admitting: Obstetrics & Gynecology

## 2024-01-26 VITALS — BP 110/75 | HR 85 | Wt 215.2 lb

## 2024-01-26 DIAGNOSIS — Z3483 Encounter for supervision of other normal pregnancy, third trimester: Secondary | ICD-10-CM

## 2024-01-26 DIAGNOSIS — Z3A34 34 weeks gestation of pregnancy: Secondary | ICD-10-CM

## 2024-01-26 DIAGNOSIS — O26843 Uterine size-date discrepancy, third trimester: Secondary | ICD-10-CM

## 2024-01-26 NOTE — Progress Notes (Signed)
   PRENATAL VISIT NOTE  Subjective:  Evelyn Caldwell is a 33 y.o. G3P2002 at [redacted]w[redacted]d being seen today for ongoing prenatal care.  She is currently monitored for the following issues for this low-risk pregnancy and has Supervision of normal pregnancy; Obesity affecting pregnancy, antepartum; and Consultation for female sterilization on their problem list.  Patient reports some pelvic pressure due to enlarging pregnant belly..  Contractions: Irregular. Vag. Bleeding: None.  Movement: Present. Denies leaking of fluid.   The following portions of the patient's history were reviewed and updated as appropriate: allergies, current medications, past family history, past medical history, past social history, past surgical history and problem list.   Objective:    Vitals:   01/26/24 1039  BP: 110/75  Pulse: 85  Weight: 215 lb 4 oz (97.6 kg)    Fetal Status:  Fetal Heart Rate (bpm): 146 Fundal Height: 39 cm Movement: Present    General: Alert, oriented and cooperative. Patient is in no acute distress.  Skin: Skin is warm and dry. No rash noted.   Cardiovascular: Normal heart rate noted  Respiratory: Normal respiratory effort, no problems with respiration noted  Abdomen: Soft, gravid, appropriate for gestational age.  Pain/Pressure: Present     Pelvic: Cervical exam deferred        Extremities: Normal range of motion.  Edema: None  Mental Status: Normal mood and affect. Normal behavior. Normal judgment and thought content.   Assessment and Plan:  Pregnancy: G3P2002 at [redacted]w[redacted]d 1. Significant discrepancy between uterine size and clinical dates, antepartum, third trimester (Primary) Elevated fundal height, will obtain ultrasound - US  MFM OB FOLLOW UP; Future  2. [redacted] weeks gestation of pregnancy 3. Encounter for supervision of other normal pregnancy in third trimester No other concerns. Preterm labor symptoms and general obstetric precautions including but not limited to vaginal bleeding,  contractions, leaking of fluid and fetal movement were reviewed in detail with the patient. Please refer to After Visit Summary for other counseling recommendations.   Return in about 2 weeks (around 02/09/2024) for OFFICE OB VISIT (MD only).  Future Appointments  Date Time Provider Department Center  02/09/2024 10:35 AM Delfina Schreurs, Gloris LABOR, MD [facility]  02/16/2024 10:35 AM Fredirick Glenys RAMAN, MD [facility]  02/23/2024 10:35 AM Herchel, Gloris LABOR, MD [facility]  05/14/2024 10:30 AM Jackquline Sawyer, MD [facility] None    Gloris Herchel, MD

## 2024-01-26 NOTE — Patient Instructions (Signed)

## 2024-01-31 ENCOUNTER — Ambulatory Visit: Payer: Self-pay | Attending: Obstetrics & Gynecology | Admitting: Maternal & Fetal Medicine

## 2024-01-31 ENCOUNTER — Ambulatory Visit (HOSPITAL_BASED_OUTPATIENT_CLINIC_OR_DEPARTMENT_OTHER): Payer: Self-pay

## 2024-01-31 VITALS — BP 117/75

## 2024-01-31 DIAGNOSIS — O26843 Uterine size-date discrepancy, third trimester: Secondary | ICD-10-CM | POA: Insufficient documentation

## 2024-01-31 DIAGNOSIS — O99213 Obesity complicating pregnancy, third trimester: Secondary | ICD-10-CM | POA: Insufficient documentation

## 2024-01-31 DIAGNOSIS — Z3403 Encounter for supervision of normal first pregnancy, third trimester: Secondary | ICD-10-CM

## 2024-01-31 DIAGNOSIS — E669 Obesity, unspecified: Secondary | ICD-10-CM

## 2024-01-31 DIAGNOSIS — Z3A34 34 weeks gestation of pregnancy: Secondary | ICD-10-CM

## 2024-01-31 DIAGNOSIS — N3000 Acute cystitis without hematuria: Secondary | ICD-10-CM

## 2024-01-31 DIAGNOSIS — Z3483 Encounter for supervision of other normal pregnancy, third trimester: Secondary | ICD-10-CM

## 2024-01-31 DIAGNOSIS — O99891 Other specified diseases and conditions complicating pregnancy: Secondary | ICD-10-CM

## 2024-01-31 NOTE — Progress Notes (Unsigned)
 N/a

## 2024-01-31 NOTE — Progress Notes (Signed)
   Patient information  Patient Name: Evelyn Caldwell  Patient MRN:   [id number]  Referring practice: MFM Referring Provider: [facility]  Problem List   Patient Active Problem List   Diagnosis Date Noted   Consultation for female sterilization 11/15/2023   Obesity affecting pregnancy, antepartum 10/06/2023   Supervision of normal pregnancy 07/22/2023   Maternal Fetal medicine Consult  Evelyn Caldwell is a 33 y.o. G3P2002 at [redacted]w[redacted]d here for ultrasound and consultation. Evelyn Caldwell is doing well today with no acute concerns. Today we focused on the following:   Size/date discrepancy: The patient was sent back to us  for a size/date discrepancy on fundal height. Today the EFW is at the 69th percentile.   Nephrolithiasis: The patient reports that most of her kidney stone pain is resolved.  She has passed multiple stones during this pregnancy.  I encouraged her to stay hydrated and continue to follow the diet prescribed by her OB provider  The patient had time to ask questions that were answered to her satisfaction.  She verbalized understanding and agrees to proceed with the plan below.  Sonographic findings Single intrauterine pregnancy at 34w 5d.  Fetal cardiac activity:  Observed and appears normal. Presentation: Cephalic. Interval fetal anatomy appears normal. Fetal biometry shows the estimated fetal weight at the 70 percentile. Amniotic fluid volume: Within normal limits. MVP: 7.49 cm. Placenta: Anterior.  There are limitations of prenatal ultrasound such as the inability to detect certain abnormalities due to poor visualization. Various factors such as fetal position, gestational age and maternal body habitus may increase the difficulty in visualizing the fetal anatomy.    Recommendations -No further ultrasounds are recommended at this time based on the current indications. If future indications arise (e.g. size/date discrepancy on  fundal height, gestational diabetes or hypertension) and an ultrasound is to be desired at our MFM office, please send a referral.  Review of Systems: A review of systems was performed and was negative except per HPI   Vitals and Physical Exam    01/31/2024   10:09 AM 01/26/2024   10:39 AM 01/11/2024   11:04 AM  Vitals with BMI  Weight  215 lbs 4 oz 215 lbs  Systolic 117 110 885  Diastolic 75 75 73  Pulse  85 90    Sitting comfortably on the sonogram tableb  Nonlabored breathing Normal rate and rhythm Abdomen is nontender  Past pregnancies OB History  Gravida Para Term Preterm AB Living  3 2 2   2   SAB IAB Ectopic Multiple Live Births     0 2    # Outcome Date GA Lbr Len/2nd Weight Sex Type Anes PTL Lv  3 Current           2 Term 10/18/18 [redacted]w[redacted]d 06:30 / 01:49 8 lb 12.4 oz (3.98 kg) F Vag-Spont EPI  LIV  1 Term 03/09/16 [redacted]w[redacted]d  8 lb 1.6 oz (3.674 kg) F Vag-Spont   LIV     I spent 20 minutes reviewing the patients chart, including labs and images as well as counseling the patient about her medical conditions. Greater than 50% of the time was spent in direct face-to-face patient counseling.  Evelyn Caldwell  MFM, [facility]   01/31/2024  11:04 AM

## 2024-02-04 ENCOUNTER — Inpatient Hospital Stay (HOSPITAL_COMMUNITY)
Admission: AD | Admit: 2024-02-04 | Discharge: 2024-02-04 | Disposition: A | Discharge status: Home / Self Care | Payer: Self-pay | Source: Ambulatory Visit | Attending: Obstetrics & Gynecology | Admitting: Obstetrics & Gynecology

## 2024-02-04 ENCOUNTER — Inpatient Hospital Stay (HOSPITAL_COMMUNITY): Payer: Self-pay

## 2024-02-04 ENCOUNTER — Encounter (HOSPITAL_COMMUNITY): Payer: Self-pay | Admitting: Obstetrics & Gynecology

## 2024-02-04 ENCOUNTER — Other Ambulatory Visit: Payer: Self-pay

## 2024-02-04 DIAGNOSIS — O4703 False labor before 37 completed weeks of gestation, third trimester: Secondary | ICD-10-CM | POA: Insufficient documentation

## 2024-02-04 DIAGNOSIS — O479 False labor, unspecified: Secondary | ICD-10-CM

## 2024-02-04 DIAGNOSIS — N200 Calculus of kidney: Secondary | ICD-10-CM

## 2024-02-04 DIAGNOSIS — O99891 Other specified diseases and conditions complicating pregnancy: Secondary | ICD-10-CM | POA: Insufficient documentation

## 2024-02-04 DIAGNOSIS — Z3A35 35 weeks gestation of pregnancy: Secondary | ICD-10-CM

## 2024-02-04 DIAGNOSIS — N132 Hydronephrosis with renal and ureteral calculous obstruction: Secondary | ICD-10-CM | POA: Insufficient documentation

## 2024-02-04 LAB — CBC WITH DIFFERENTIAL/PLATELET
Abs Immature Granulocytes: 0.03 K/uL (ref 0.00–0.07)
Basophils Absolute: 0 K/uL (ref 0.0–0.1)
Basophils Relative: 0 %
Eosinophils Absolute: 0 K/uL (ref 0.0–0.5)
Eosinophils Relative: 1 %
HCT: 28.5 % — ABNORMAL LOW (ref 36.0–46.0)
Hemoglobin: 9.5 g/dL — ABNORMAL LOW (ref 12.0–15.0)
Immature Granulocytes: 0 %
Lymphocytes Relative: 23 %
Lymphs Abs: 1.8 K/uL (ref 0.7–4.0)
MCH: 27.9 pg (ref 26.0–34.0)
MCHC: 33.3 g/dL (ref 30.0–36.0)
MCV: 83.6 fL (ref 80.0–100.0)
Monocytes Absolute: 0.4 K/uL (ref 0.1–1.0)
Monocytes Relative: 6 %
Neutro Abs: 5.4 K/uL (ref 1.7–7.7)
Neutrophils Relative %: 70 %
Platelets: 229 K/uL (ref 150–400)
RBC: 3.41 MIL/uL — ABNORMAL LOW (ref 3.87–5.11)
RDW: 13.7 % (ref 11.5–15.5)
WBC: 7.7 K/uL (ref 4.0–10.5)
nRBC: 0 % (ref 0.0–0.2)

## 2024-02-04 LAB — URINALYSIS, ROUTINE W REFLEX MICROSCOPIC
Bilirubin Urine: NEGATIVE
Glucose, UA: NEGATIVE mg/dL
Ketones, ur: NEGATIVE mg/dL
Leukocytes,Ua: NEGATIVE
Nitrite: NEGATIVE
Protein, ur: 100 mg/dL — AB
Specific Gravity, Urine: 1.012 (ref 1.005–1.030)
pH: 6 (ref 5.0–8.0)

## 2024-02-04 LAB — COMPREHENSIVE METABOLIC PANEL WITH GFR
ALT: 10 U/L (ref 0–44)
AST: 17 U/L (ref 15–41)
Albumin: 2.4 g/dL — ABNORMAL LOW (ref 3.5–5.0)
Alkaline Phosphatase: 146 U/L — ABNORMAL HIGH (ref 38–126)
Anion gap: 13 (ref 5–15)
BUN: 6 mg/dL (ref 6–20)
CO2: 19 mmol/L — ABNORMAL LOW (ref 22–32)
Calcium: 8.3 mg/dL — ABNORMAL LOW (ref 8.9–10.3)
Chloride: 104 mmol/L (ref 98–111)
Creatinine, Ser: 0.53 mg/dL (ref 0.44–1.00)
GFR, Estimated: >60 mL/min (ref >60)
Glucose, Bld: 93 mg/dL (ref 70–99)
Potassium: 3.5 mmol/L (ref 3.5–5.1)
Sodium: 136 mmol/L (ref 135–145)
Total Bilirubin: 0.3 mg/dL (ref 0.0–1.2)
Total Protein: 6.5 g/dL (ref 6.5–8.1)

## 2024-02-04 MED ORDER — HYDROMORPHONE HCL 1 MG/ML IJ SOLN
1.0000 mg | Freq: Once | INTRAMUSCULAR | Status: AC
  Administered 2024-02-04: 1 mg via INTRAVENOUS
  Filled 2024-02-04: qty 1

## 2024-02-04 MED ORDER — PROMETHAZINE HCL 25 MG PO TABS: 25.0000 mg | ORAL_TABLET | Freq: Four times a day (QID) | ORAL | 0 refills | Status: DC | PRN

## 2024-02-04 MED ORDER — OXYCODONE HCL 5 MG PO TABS: 5.0000 mg | ORAL_TABLET | Freq: Four times a day (QID) | ORAL | 0 refills | Status: DC | PRN

## 2024-02-04 MED ORDER — SODIUM CHLORIDE 0.9 % IV SOLN
25.0000 mg | Freq: Once | INTRAVENOUS | Status: AC
  Administered 2024-02-04: 25 mg via INTRAVENOUS
  Filled 2024-02-04: qty 1

## 2024-02-04 MED ORDER — TAMSULOSIN HCL 0.4 MG PO CAPS: 0.4000 mg | ORAL_CAPSULE | Freq: Every day | ORAL | 2 refills | Status: DC

## 2024-02-04 MED ORDER — LACTATED RINGERS IV BOLUS
1000.0000 mL | Freq: Once | INTRAVENOUS | Status: AC
  Administered 2024-02-04: 1000 mL via INTRAVENOUS

## 2024-02-04 NOTE — MAU Provider Note (Signed)
 History     [id number]  Arrival date and time: 02/04/24 1658    Chief Complaint  Patient presents with   Back Pain   Abdominal Pain   Contractions     HPI Evelyn Caldwell is a 33 y.o. G3P2002 at [redacted]w[redacted]d by LMP c/w 9 wk u/s who presents for right flank pain. Hx of kidney stones in all of her pregnancies including obstructing stone resulting in pyelo & ICU admission in 2020.  Last saw urology 09/2023 with plans to f/u after delivery.  Current symptoms started last night & worsened today. Reports right flank pain that is constant as well as low back pain. Has been having intermittent abdominal tightening that is not painful. Endorses nausea. Denies fever/chills, vomiting, dysuria, hematuria, vaginal bleeding, or LOF. Reports good fetal movement. Hasn't treated symptoms.   A/Positive/-- (02/21 1106)  OB History     Gravida  3   Para  2   Term  2   Preterm      AB      Living  2      SAB      IAB      Ectopic      Multiple  0   Live Births  2           Past Medical History:  Diagnosis Date   Abdominal pain, epigastric    Acne    Anxiety    Atypical chest pain    Erosive esophagitis    GERD (gastroesophageal reflux disease)    Headache, migraine 01/24/2015   Hiatal hernia    History of kidney stones    Internal hemorrhoids 08/18/2016   Kidney stone    Migraine headache    with season changes   Nephrolithiasis 10/22/2015   Renal ultrasound-mild right hydronephrosis likely consistent with physiologic hydronephrosis of pregnancy; no obstruction with bilateral ureteral jets seen; urinalysis notable for 2+ hemoglobin     Status post vacuum-assisted vaginal delivery 03/09/2016   Vulvar lesion     Past Surgical History:  Procedure Laterality Date   CYSTOSCOPY WITH STENT PLACEMENT Right 07/27/2018   Procedure: CYSTOSCOPY WITH STENT PLACEMENT;  Surgeon: Francisca Redell BROCKS, MD;  Location: [facility];  Service: Urology;  Laterality: Right;    CYSTOSCOPY/URETEROSCOPY/HOLMIUM LASER/STENT PLACEMENT Right 08/03/2018   Procedure: CYSTOSCOPY/URETEROSCOPY/HOLMIUM LASER/STENT EXCHANGE **WITH NST BEFORE AND AFTER**;  Surgeon: Penne Knee, MD;  Location: [facility];  Service: Urology;  Laterality: Right;   CYSTOSCOPY/URETEROSCOPY/HOLMIUM LASER/STENT PLACEMENT Right 03/02/2021   Procedure: CYSTOSCOPY/URETEROSCOPY/HOLMIUM LASER/STENT PLACEMENT;  Surgeon: Penne Knee, MD;  Location: [facility];  Service: Urology;  Laterality: Right;   ESOPHAGOGASTRODUODENOSCOPY (EGD) WITH PROPOFOL  N/A 02/18/2022   Procedure: ESOPHAGOGASTRODUODENOSCOPY (EGD) WITH PROPOFOL ;  Surgeon: Unk Corinn Skiff, MD;  Location: [facility];  Service: Endoscopy;  Laterality: N/A;   EXTRACORPOREAL SHOCK WAVE LITHOTRIPSY Left 02/15/2019   Procedure: EXTRACORPOREAL SHOCK WAVE LITHOTRIPSY (ESWL);  Surgeon: Penne Knee, MD;  Location: [facility];  Service: Urology;  Laterality: Left;   none      Family History  Problem Relation Age of Onset   Alcohol abuse Father    Congestive Heart Failure Maternal Grandfather    Parkinson's disease Paternal Grandmother    Kidney cancer Neg Hx    Prostate cancer Neg Hx    Breast cancer Neg Hx    Ovarian cancer Neg Hx    Colon cancer Neg Hx     Social History   Socioeconomic History   Marital status: Married    Spouse  name: Not on file   Number of children: Not on file   Years of education: Not on file   Highest education level: Not on file  Occupational History   Not on file  Tobacco Use   Smoking status: Never   Smokeless tobacco: Never  Vaping Use   Vaping status: Never Used  Substance and Sexual Activity   Alcohol use: Not Currently    Comment: not since being pregnant   Drug use: No   Sexual activity: Yes  Other Topics Concern   Not on file  Social History Narrative   Not on file   Social Drivers of Health   Financial Resource Strain: Low Risk  (10/18/2018)   Overall Financial Resource Strain (CARDIA)     Difficulty of Paying Living Expenses: Not hard at all  Food Insecurity: No Food Insecurity (07/22/2023)   Hunger Vital Sign    Worried About Running Out of Food in the Last Year: Never true    Ran Out of Food in the Last Year: Never true  Transportation Needs: No Transportation Needs (07/22/2023)   PRAPARE - Administrator, Civil Service (Medical): No    Lack of Transportation (Non-Medical): No  Physical Activity: Unknown (10/18/2018)   Exercise Vital Sign    Days of Exercise per Week: Patient declined    Minutes of Exercise per Session: Patient declined  Stress: No Stress Concern Present (07/22/2023)   [employer] of Occupational Health - Occupational Stress Questionnaire    Feeling of Stress : Not at all  Social Connections: Unknown (10/18/2018)   Social Connection and Isolation Panel    Frequency of Communication with Friends and Family: Patient declined    Frequency of Social Gatherings with Friends and Family: Patient declined    Attends Religious Services: Patient declined    Database administrator or Organizations: Patient declined    Attends Banker Meetings: Patient declined    Marital Status: Patient declined  Intimate Partner Violence: Not At Risk (07/22/2023)   Humiliation, Afraid, Rape, and Kick questionnaire    Fear of Current or Ex-Partner: No    Emotionally Abused: No    Physically Abused: No    Sexually Abused: No    No Known Allergies  No current facility-administered medications on file prior to encounter.   Current Outpatient Medications on File Prior to Encounter  Medication Sig Dispense Refill   pantoprazole  (PROTONIX ) 40 MG tablet Take 1 tablet (40 mg total) by mouth daily. 30 tablet 2   Prenatal Vit-Fe Fumarate-FA (PRENATAL VITAMIN PO) Take by mouth.       ROS Pertinent positives and negative per HPI, all others reviewed and negative  Physical Exam   BP 131/62 (BP Location: Right Arm)   Pulse 82   Temp 98.1 F (36.7  C) (Oral)   Resp 18   Ht 5' 7 (1.702 m)   Wt 97.8 kg   LMP 06/02/2023   SpO2 100%   BMI 33.78 kg/m   Patient Vitals for the past 24 hrs:  BP Temp Temp src Pulse Resp SpO2 Height Weight  02/04/24 1928 131/62 -- -- 82 -- -- -- --  02/04/24 1900 -- -- -- -- 18 -- -- --  02/04/24 1822 -- -- -- -- 18 -- -- --  02/04/24 1751 (!) 123/59 -- -- 82 20 -- -- --  02/04/24 1750 -- -- -- -- -- 100 % -- --  02/04/24 1745 -- -- -- -- -- 99 % -- --  02/04/24 1736 -- -- -- -- -- 99 % -- --  02/04/24 1722 129/81 98.1 F (36.7 C) Oral 94 18 99 % -- --  02/04/24 1709 -- -- -- -- -- -- 5' 7 (1.702 m) 97.8 kg    Physical Exam Vitals and nursing note reviewed. Exam conducted with a chaperone present.  Constitutional:      General: She is not in acute distress.    Appearance: Normal appearance. She is not ill-appearing.  HENT:     Head: Normocephalic and atraumatic.  Eyes:     General: No scleral icterus.    Pupils: Pupils are equal, round, and reactive to light.  Pulmonary:     Effort: Pulmonary effort is normal. No respiratory distress.  Abdominal:     Tenderness: There is no abdominal tenderness. There is right CVA tenderness. There is no left CVA tenderness.     Comments: gravid  Skin:    General: Skin is warm and dry.  Neurological:     Mental Status: She is alert.      Cervical Exam Dilation: 1 Effacement (%): Thick Station: Ballotable Exam by:: Jerilynn, NP   FHT Baseline 150, moderate variability, 15x15 accels, no decels Toco: Q 2-4 mins Cat: 1  Labs Results for orders placed or performed during the hospital encounter of 02/04/24 (from the past 24 hours)  Urinalysis, Routine w reflex microscopic -Urine, Clean Catch     Status: Abnormal   Collection Time: 02/04/24  5:26 PM  Result Value Ref Range   Color, Urine YELLOW YELLOW   APPearance HAZY (A) CLEAR   Specific Gravity, Urine 1.012 1.005 - 1.030   pH 6.0 5.0 - 8.0   Glucose, UA NEGATIVE NEGATIVE mg/dL   Hgb urine  dipstick MODERATE (A) NEGATIVE   Bilirubin Urine NEGATIVE NEGATIVE   Ketones, ur NEGATIVE NEGATIVE mg/dL   Protein, ur 899 (A) NEGATIVE mg/dL   Nitrite NEGATIVE NEGATIVE   Leukocytes,Ua NEGATIVE NEGATIVE   RBC / HPF 11-20 0 - 5 RBC/hpf   WBC, UA 0-5 0 - 5 WBC/hpf   Bacteria, UA RARE (A) NONE SEEN   Squamous Epithelial / HPF 0-5 0 - 5 /HPF   Mucus PRESENT   CBC with Differential/Platelet     Status: Abnormal   Collection Time: 02/04/24  6:13 PM  Result Value Ref Range   WBC 7.7 4.0 - 10.5 K/uL   RBC 3.41 (L) 3.87 - 5.11 MIL/uL   Hemoglobin 9.5 (L) 12.0 - 15.0 g/dL   HCT 71.4 (L) 63.9 - 53.9 %   MCV 83.6 80.0 - 100.0 fL   MCH 27.9 26.0 - 34.0 pg   MCHC 33.3 30.0 - 36.0 g/dL   RDW 86.2 88.4 - 84.4 %   Platelets 229 150 - 400 K/uL   nRBC 0.0 0.0 - 0.2 %   Neutrophils Relative % 70 %   Neutro Abs 5.4 1.7 - 7.7 K/uL   Lymphocytes Relative 23 %   Lymphs Abs 1.8 0.7 - 4.0 K/uL   Monocytes Relative 6 %   Monocytes Absolute 0.4 0.1 - 1.0 K/uL   Eosinophils Relative 1 %   Eosinophils Absolute 0.0 0.0 - 0.5 K/uL   Basophils Relative 0 %   Basophils Absolute 0.0 0.0 - 0.1 K/uL   Immature Granulocytes 0 %   Abs Immature Granulocytes 0.03 0.00 - 0.07 K/uL  Comprehensive metabolic panel with GFR     Status: Abnormal   Collection Time: 02/04/24  6:13 PM  Result Value Ref  Range   Sodium 136 135 - 145 mmol/L   Potassium 3.5 3.5 - 5.1 mmol/L   Chloride 104 98 - 111 mmol/L   CO2 19 (L) 22 - 32 mmol/L   Glucose, Bld 93 70 - 99 mg/dL   BUN 6 6 - 20 mg/dL   Creatinine, Ser 9.46 0.44 - 1.00 mg/dL   Calcium  8.3 (L) 8.9 - 10.3 mg/dL   Total Protein 6.5 6.5 - 8.1 g/dL   Albumin 2.4 (L) 3.5 - 5.0 g/dL   AST 17 15 - 41 U/L   ALT 10 0 - 44 U/L   Alkaline Phosphatase 146 (H) 38 - 126 U/L   Total Bilirubin 0.3 0.0 - 1.2 mg/dL   GFR, Estimated >39 >39 mL/min   Anion gap 13 5 - 15    Imaging CT Renal Stone Study Result Date: 02/04/2024 CLINICAL DATA:  Abdominal and flank pain, [redacted] weeks  pregnant, nephrolithiasis and right hydronephrosis on earlier ultrasound EXAM: CT ABDOMEN AND PELVIS WITHOUT CONTRAST TECHNIQUE: Multidetector CT imaging of the abdomen and pelvis was performed following the standard protocol without IV contrast. RADIATION DOSE REDUCTION: This exam was performed according to the departmental dose-optimization program which includes automated exposure control, adjustment of the mA and/or kV according to patient size and/or use of iterative reconstruction technique. COMPARISON:  04/15/2019, 02/04/2024 FINDINGS: Lower chest: No acute pleural or parenchymal lung disease. Hepatobiliary: Unremarkable unenhanced appearance of the liver and gallbladder. No biliary duct dilation. Pancreas: Unremarkable unenhanced appearance. Spleen: Unremarkable unenhanced appearance. Adrenals/Urinary Tract: The adrenals are unremarkable. There are numerous bilateral renal calculi, measuring up to 4 mm on the left and 8 mm on the right. There is moderate right hydronephrosis and proximal right hydroureter, with no evidence of obstructing calculus. Obstruction may be due to extrinsic mass effect by the enlarged gravid uterus. The bladder is decompressed, limiting its evaluation. Stomach/Bowel: No bowel obstruction or ileus. Normal appendix right lower quadrant. No bowel wall thickening or inflammatory change. Vascular/Lymphatic: No significant vascular findings are present. No enlarged abdominal or pelvic lymph nodes. Reproductive: Gravid uterus is identified, with a single intrauterine pregnancy in cephalic presentation. Assessment of fetal anatomy is beyond the scope of this unenhanced CT, please refer to recent obstetric ultrasound exam. No adnexal masses. Other: No free fluid or free intraperitoneal gas. No abdominal wall hernia. Musculoskeletal: No acute or destructive bony abnormalities. Reconstructed images demonstrate no additional findings. IMPRESSION: 1. Multiple bilateral nonobstructing renal  calculi as above. 2. Moderate right-sided hydronephrosis and proximal right hydroureter, with no evidence of obstructing ureteral calculus. Obstruction is likely due to extrinsic mass effect upon the mid right ureter by the enlarged gravid uterus. 3. Single intrauterine pregnancy in cephalic presentation. Please refer to recent obstetric ultrasound for full description of fetal anatomy. Electronically Signed   By: Ozell Daring M.D.   On: 02/04/2024 20:39   US  RENAL Result Date: 02/04/2024 CLINICAL DATA:  Pregnancy with nephrolithiasis in 3rd trimester. Acute right flank pain. EXAM: RENAL / URINARY TRACT ULTRASOUND COMPLETE COMPARISON:  08/04/2023 FINDINGS: Right Kidney: Renal measurements: 613.3 x 5.7 x 6.2 cm = volume: 247 mL. Schmorl tubal nonobstructing stones in the right kidney, the largest 2 cm in the lower pole. Moderate right hydronephrosis. Left Kidney: Renal measurements: 12.3 x 5.7 x 5.3 cm = volume: 208 mL. Echogenicity within normal limits. No mass or hydronephrosis visualized. Bladder: Appears normal for degree of bladder distention. Other: None. IMPRESSION: Right nephrolithiasis. Moderate right hydronephrosis. Electronically Signed   By: Franky Crease  M.D.   On: 02/04/2024 19:12    MAU Course  Procedures Lab Orders         Culture, OB Urine         Urinalysis, Routine w reflex microscopic -Urine, Clean Catch         CBC with Differential/Platelet         Comprehensive metabolic panel with GFR    Meds ordered this encounter  Medications   lactated ringers  bolus 1,000 mL   promethazine  (PHENERGAN ) 25 mg in sodium chloride  0.9 % 50 mL IVPB   HYDROmorphone  (DILAUDID ) injection 1 mg   HYDROmorphone  (DILAUDID ) injection 1 mg   oxyCODONE  (OXY IR/ROXICODONE ) 5 MG immediate release tablet    Sig: Take 1 tablet (5 mg total) by mouth every 6 (six) hours as needed for breakthrough pain or severe pain (pain score 7-10).    Dispense:  15 tablet    Refill:  0    Supervising Provider:   PRATT,  TANYA S [[id number]]   promethazine  (PHENERGAN ) 25 MG tablet    Sig: Take 1 tablet (25 mg total) by mouth every 6 (six) hours as needed for nausea or vomiting.    Dispense:  30 tablet    Refill:  0    Supervising Provider:   PRATT, TANYA S [[id number]]   tamsulosin  (FLOMAX ) 0.4 MG CAPS capsule    Sig: Take 1 capsule (0.4 mg total) by mouth daily.    Dispense:  30 capsule    Refill:  2    Supervising Provider:   PRATT, TANYA S [[id number]]   Imaging Orders         US  RENAL         CT Renal Stone Study      MDM moderate  Assessment and Plan   1. Pregnancy complicated by nephrolithiasis in third trimester, antepartum -Presents with symptoms of stones. Does have some right CVAT but afebrile, no leukocytosis, & u/a not obvious for infection.  -Renal ultrasound shows right stones, largest measuring 2 cm, non obstructing. Patient reports continued flank pain after IV dilaudid . Per consult with Dr. Carolee with urology, renal CT ordered to evaluate for ureteral stones/obstruction.  -Renal CT confirms bilateral stones. No ureteral stones or obstruction.  -Per discussion with Dr. Cleatus, will send home with pain/nausea meds & flomax .  -Reviewed return precautions with patient -Urine culture pending   2. Braxton Hicks contractions  -Contracting on monitor & reports intermittent abdominal tightening. Denies pain with contractions. Cervix 1/thick/-3 & unchanged prior to discharge  3. [redacted] weeks gestation of pregnancy     #FWB: reactive nst    Dispo: discharged to home in stable condition.     Rocky Satterfield, NP 02/04/24 9:40 PM  Allergies as of 02/04/2024   No Known Allergies      Medication List     TAKE these medications    oxyCODONE  5 MG immediate release tablet Commonly known as: Oxy IR/ROXICODONE  Take 1 tablet (5 mg total) by mouth every 6 (six) hours as needed for breakthrough pain or severe pain (pain score 7-10).   pantoprazole  40 MG tablet Commonly known as: Protonix  Take 1 tablet  (40 mg total) by mouth daily.   PRENATAL VITAMIN PO Take by mouth.   promethazine  25 MG tablet Commonly known as: PHENERGAN  Take 1 tablet (25 mg total) by mouth every 6 (six) hours as needed for nausea or vomiting.   tamsulosin  0.4 MG Caps capsule Commonly known as: FLOMAX  Take 1 capsule (0.4 mg  total) by mouth daily.

## 2024-02-04 NOTE — MAU Note (Signed)
 Evelyn Caldwell is a 33 y.o. at [redacted]w[redacted]d here in MAU reporting: right sided, upper abdominal pain that started yesterday afternoon. Then she reports right sided, lower back pain that started around 1300 today. She reports feeling occasional contractions, denies having LOF or vaginal bleeding. Patient does report feeling fetal movement.   EDD:  03/08/2024 Onset of complaint: yesterday afternoon   Pain score: 8 Vitals:   02/04/24 1722  BP: 129/81  Pulse: 94  Resp: 18  Temp: 98.1 F (36.7 C)  SpO2: 99%     FHT: 150  Lab orders placed from triage: urinalysis

## 2024-02-07 LAB — CULTURE, OB URINE
Culture: 30000 — AB
Special Requests: NORMAL

## 2024-02-09 ENCOUNTER — Other Ambulatory Visit (HOSPITAL_COMMUNITY)
Admission: RE | Admit: 2024-02-09 | Discharge: 2024-02-09 | Disposition: A | Discharge status: Home / Self Care | Payer: Self-pay | Source: Ambulatory Visit | Attending: Obstetrics & Gynecology | Admitting: Obstetrics & Gynecology

## 2024-02-09 ENCOUNTER — Encounter: Payer: Self-pay | Admitting: Obstetrics & Gynecology

## 2024-02-09 ENCOUNTER — Ambulatory Visit: Payer: Self-pay | Admitting: Obstetrics & Gynecology

## 2024-02-09 VITALS — BP 115/74 | HR 93 | Wt 217.4 lb

## 2024-02-09 DIAGNOSIS — Z3A36 36 weeks gestation of pregnancy: Secondary | ICD-10-CM

## 2024-02-09 DIAGNOSIS — Z3483 Encounter for supervision of other normal pregnancy, third trimester: Secondary | ICD-10-CM | POA: Insufficient documentation

## 2024-02-09 DIAGNOSIS — Z23 Encounter for immunization: Secondary | ICD-10-CM

## 2024-02-09 DIAGNOSIS — N200 Calculus of kidney: Secondary | ICD-10-CM | POA: Insufficient documentation

## 2024-02-09 DIAGNOSIS — O26893 Other specified pregnancy related conditions, third trimester: Secondary | ICD-10-CM

## 2024-02-09 DIAGNOSIS — O99891 Other specified diseases and conditions complicating pregnancy: Secondary | ICD-10-CM

## 2024-02-09 NOTE — Patient Instructions (Signed)

## 2024-02-09 NOTE — Progress Notes (Signed)
 PRENATAL VISIT NOTE  Subjective:  Evelyn Caldwell is a 33 y.o. G3P2002 at [redacted]w[redacted]d being seen today for ongoing prenatal care.  She is currently monitored for the following issues for this low-risk pregnancy and has Supervision of normal pregnancy; Obesity affecting pregnancy, antepartum; Consultation for female sterilization; and Pregnancy complicated by nephrolithiasis in third trimester, antepartum on their problem list.  Patient reports persistent pain from her kidney stones, 02/04/24 CT scan showed nonobstructing 8 mm and 4 mm stones, moderate right hydronephrosis.  She had to be induced in previous pregnancy due to this, she wants to be scheduled for induction for this pregnancy too.  Contractions: Irritability. Vag. Bleeding: None.  Movement: Present. Denies leaking of fluid.   The following portions of the patient's history were reviewed and updated as appropriate: allergies, current medications, past family history, past medical history, past social history, past surgical history and problem list.   Objective:    Vitals:   02/09/24 1040  BP: 115/74  Pulse: 93  Weight: 217 lb 6.4 oz (98.6 kg)    Fetal Status:  Fetal Heart Rate (bpm): 149   Movement: Present    General: Alert, oriented and cooperative. Patient is in no acute distress.  Skin: Skin is warm and dry. No rash noted.   Cardiovascular: Normal heart rate noted  Respiratory: Normal respiratory effort, no problems with respiration noted  Abdomen: Soft, gravid, appropriate for gestational age.  Pain/Pressure: Absent     Pelvic: Cervical exam deferred, but cultures done in presence of a chaperone        Extremities: Normal range of motion.  Edema: Trace (feet; OSD 1-2 days)  Mental Status: Normal mood and affect. Normal behavior. Normal judgment and thought content.   CT Renal Stone Study Result Date: 02/04/2024 CLINICAL DATA:  Abdominal and flank pain, [redacted] weeks pregnant, nephrolithiasis and right hydronephrosis on  earlier ultrasound EXAM: CT ABDOMEN AND PELVIS WITHOUT CONTRAST TECHNIQUE: Multidetector CT imaging of the abdomen and pelvis was performed following the standard protocol without IV contrast. RADIATION DOSE REDUCTION: This exam was performed according to the departmental dose-optimization program which includes automated exposure control, adjustment of the mA and/or kV according to patient size and/or use of iterative reconstruction technique. COMPARISON:  04/15/2019, 02/04/2024 FINDINGS: Lower chest: No acute pleural or parenchymal lung disease. Hepatobiliary: Unremarkable unenhanced appearance of the liver and gallbladder. No biliary duct dilation. Pancreas: Unremarkable unenhanced appearance. Spleen: Unremarkable unenhanced appearance. Adrenals/Urinary Tract: The adrenals are unremarkable. There are numerous bilateral renal calculi, measuring up to 4 mm on the left and 8 mm on the right. There is moderate right hydronephrosis and proximal right hydroureter, with no evidence of obstructing calculus. Obstruction may be due to extrinsic mass effect by the enlarged gravid uterus. The bladder is decompressed, limiting its evaluation. Stomach/Bowel: No bowel obstruction or ileus. Normal appendix right lower quadrant. No bowel wall thickening or inflammatory change. Vascular/Lymphatic: No significant vascular findings are present. No enlarged abdominal or pelvic lymph nodes. Reproductive: Gravid uterus is identified, with a single intrauterine pregnancy in cephalic presentation. Assessment of fetal anatomy is beyond the scope of this unenhanced CT, please refer to recent obstetric ultrasound exam. No adnexal masses. Other: No free fluid or free intraperitoneal gas. No abdominal wall hernia. Musculoskeletal: No acute or destructive bony abnormalities. Reconstructed images demonstrate no additional findings. IMPRESSION: 1. Multiple bilateral nonobstructing renal calculi as above. 2. Moderate right-sided hydronephrosis  and proximal right hydroureter, with no evidence of obstructing ureteral calculus. Obstruction is likely due  to extrinsic mass effect upon the mid right ureter by the enlarged gravid uterus. 3. Single intrauterine pregnancy in cephalic presentation. Please refer to recent obstetric ultrasound for full description of fetal anatomy. Electronically Signed   By: Ozell Daring M.D.   On: 02/04/2024 20:39   US  RENAL Result Date: 02/04/2024 CLINICAL DATA:  Pregnancy with nephrolithiasis in 3rd trimester. Acute right flank pain. EXAM: RENAL / URINARY TRACT ULTRASOUND COMPLETE COMPARISON:  08/04/2023 FINDINGS: Right Kidney: Renal measurements: 613.3 x 5.7 x 6.2 cm = volume: 247 mL. Schmorl tubal nonobstructing stones in the right kidney, the largest 2 cm in the lower pole. Moderate right hydronephrosis. Left Kidney: Renal measurements: 12.3 x 5.7 x 5.3 cm = volume: 208 mL. Echogenicity within normal limits. No mass or hydronephrosis visualized. Bladder: Appears normal for degree of bladder distention. Other: None. IMPRESSION: Right nephrolithiasis. Moderate right hydronephrosis. Electronically Signed   By: Franky Crease M.D.   On: 02/04/2024 19:12   US  MFM OB FOLLOW UP Result Date: 01/31/2024 ----------------------------------------------------------------------  OBSTETRICS REPORT                       (Signed Final 01/31/2024 11:21 am) ---------------------------------------------------------------------- Patient Info  ID #:       [id number]                          D.O.B.:  Jan 12, 1991 (33 yrs)(F)  Name:       Evelyn Caldwell                  Visit Date: 01/31/2024 10:29 am              [place] ---------------------------------------------------------------------- Performed By  Attending:        Delora Smaller DO       Ref. Address:     [address]                                                             [address]  Performed By:     Rumaldo Sharps RDMS      [facility]  Referred By:      [facility] ---------------------------------------------------------------------- Orders  #  Description                           Code        Ordered By  1  US  MFM OB FOLLOW UP                   23183.98    GLORIS HUGGER ----------------------------------------------------------------------  #  Order #                     Accession #                Episode #  1  [id number]                   [id number]                 [id number] ---------------------------------------------------------------------- Indications  Uterine size-date discrepancy, third trimester O26.843  Obesity complicating pregnancy, third          O99.213  trimester  Medical complication of pregnancy              O26.90  (nephrolithiasis)  Low Risk NIPS - declined AFP  Passed GTT  [redacted] weeks gestation of pregnancy                Z3A.34 ---------------------------------------------------------------------- Vital Signs  BP:          117/75 ---------------------------------------------------------------------- Fetal Evaluation  Num Of Fetuses:         1  Fetal Heart Rate(bpm):  145  Cardiac Activity:       Observed  Presentation:           Cephalic  Placenta:               Anterior  P. Cord Insertion:      Previously seen  Amniotic Fluid  AFI FV:      Within normal limits  AFI Sum(cm)     %Tile       Largest Pocket(cm)  21.59           81          7.49  RUQ(cm)       RLQ(cm)       LUQ(cm)        LLQ(cm)  7.49          5.64          3.7            4.76 ---------------------------------------------------------------------- Biometry  BPD:      87.7  mm     G. Age:  35w 3d         71  %    CI:        71.67   %    70 - 86                                                          FL/HC:      21.0   %    20.1 - 22.3  HC:      329.8  mm     G. Age:  37w 4d  83  %    HC/AC:      1.06        0.93 - 1.11  AC:      312.3  mm     G. Age:  35w 1d         69  %    FL/BPD:     78.8   %    71 - 87  FL:       69.1  mm     G. Age:  35w 3d         62  %    FL/AC:      22.1   %    20 - 24  LV:        4.8  mm  Est. FW:    2699  gm    5 lb 15 oz      70  % ---------------------------------------------------------------------- OB History  Gravidity:    3         Term:   2  Living:       2 ---------------------------------------------------------------------- Gestational Age  LMP:           34w 5d        Date:  06/02/23                   EDD:   03/08/24  U/S Today:     35w 6d                                        EDD:   02/29/24  Best:          34w 5d     Det. By:  LMP  (06/02/23)          EDD:   03/08/24 ---------------------------------------------------------------------- Anatomy  Cranium:               Previously seen        Aortic Arch:            Previously seen  Cavum:                 Previously seen        Ductal Arch:            Previously seen  Ventricles:            Appears normal         Diaphragm:              Appears normal  Choroid Plexus:        Previously seen        Stomach:                Appears normal, left                                                                        sided  Cerebellum:            Previously seen        Abdomen:                Previously seen  Posterior Fossa:  Previously seen        Abdominal Wall:         Previously seen  Face:                  Orbits and profile     Cord Vessels:           Previously seen                         previously seen  Lips:                  Previously seen        Kidneys:                Appear normal  Thoracic:              Previously seen        Bladder:                Appears normal  Heart:                 Previously seen        Spine:                  Previously seen  RVOT:                  Previously seen        Upper Extremities:      Previously seen  LVOT:                  Previously seen         Lower Extremities:      Previously seen  Other:  4CH limited today due to fetal position. ---------------------------------------------------------------------- Cervix Uterus Adnexa  Cervix  Not visualized (advanced GA >24wks)  Uterus  No abnormality visualized.  Right Ovary  Within normal limits.  Left Ovary  Not visualized.  Cul De Sac  No free fluid seen.  Adnexa  No adnexal mass visualized ---------------------------------------------------------------------- Comments  Maternal Fetal medicine Consult  Blythe AMBER IANNELLI is a 33 y.o. G3P2002 at  [redacted]w[redacted]d here for ultrasound and consultation. Nour  AMBER Linquist is doing well today with no acute  concerns. Today we focused on the following:  Size/date discrepancy: The patient was sent back to us  for a  size/date discrepancy on fundal height. Today the EFW is at  the 69th percentile.  Nephrolithiasis: The patient reports that most of her kidney  stone pain is resolved.  She has passed multiple stones  during this pregnancy.  I encouraged her to stay hydrated and  continue to follow the diet prescribed by her OB provider  The patient had time to ask questions that were answered to  her satisfaction.  She verbalized understanding and agrees to  proceed with the plan below.  Sonographic findings  Single intrauterine pregnancy at 34w 5d.  Fetal cardiac activity:  Observed and appears normal.  Presentation: Cephalic.  Interval fetal anatomy appears normal.  Fetal biometry shows the estimated fetal weight at the 70  percentile.  Amniotic fluid volume: Within normal limits. MVP: 7.49 cm.  Placenta: Anterior.  There are limitations of prenatal ultrasound such as the  inability to detect certain abnormalities due to poor  visualization. Various factors such as fetal position,  gestational age and maternal body habitus may increase the  difficulty in visualizing the fetal anatomy.  Recommendations  -No further ultrasounds  are recommended at this time based  on the  current indications. If future indications arise (e.g.  size/date discrepancy on fundal height, gestational diabetes  or hypertension) and an ultrasound is to be desired at our  MFM office, please send a referral. ----------------------------------------------------------------------                 Delora Smaller, DO Electronically Signed Final Report   01/31/2024 11:21 am ----------------------------------------------------------------------    Assessment and Plan:  Pregnancy: G3P2002 at [redacted]w[redacted]d 1. Pregnancy complicated by nephrolithiasis in third trimester, antepartum (Primary) IOL scheduled at 39 weeks (03/01/24) at midnight, orders signed and held. She will continue pain medications for now, advised to come to [facility] for any concerns.  2. Flu vaccine need - Flu vaccine trivalent PF, 6mos and older(Flulaval,Afluria,Fluarix,Fluzone) given  3. [redacted] weeks gestation of pregnancy 4. Encounter for supervision of other normal pregnancy in third trimester Pelvic cultures, will follow up results and manage accordingly. - Strep Gp B NAA - Cervicovaginal ancillary only Preterm labor symptoms and general obstetric precautions including but not limited to vaginal bleeding, contractions, leaking of fluid and fetal movement were reviewed in detail with the patient. Please refer to After Visit Summary for other counseling recommendations.   Return in about 1 week (around 02/16/2024) for OFFICE OB VISIT (MD or APP).  Future Appointments  Date Time Provider Department Center  02/13/2024 10:40 AM Maurine Lukes, PA-C [facility] None  02/16/2024 10:35 AM Fredirick Glenys RAMAN, MD [facility]  02/23/2024 10:35 AM Herchel, Gloris LABOR, MD [facility]  03/01/2024 12:00 AM [facility] SCHED ROOM [facility] None  05/14/2024 10:30 AM Jackquline Sawyer, MD [facility] None    Gloris Herchel, MD

## 2024-02-10 ENCOUNTER — Ambulatory Visit: Payer: Self-pay | Admitting: Obstetrics & Gynecology

## 2024-02-10 LAB — CERVICOVAGINAL ANCILLARY ONLY
Chlamydia: NEGATIVE
Comment: NEGATIVE
Comment: NORMAL
Neisseria Gonorrhea: NEGATIVE

## 2024-02-11 LAB — STREP GP B NAA: Strep Gp B NAA: NEGATIVE

## 2024-02-13 ENCOUNTER — Encounter: Payer: Self-pay | Admitting: Physician Assistant

## 2024-02-13 ENCOUNTER — Ambulatory Visit (INDEPENDENT_AMBULATORY_CARE_PROVIDER_SITE_OTHER): Payer: Self-pay | Admitting: Physician Assistant

## 2024-02-13 VITALS — BP 120/77 | HR 93 | Ht 67.0 in | Wt 217.0 lb

## 2024-02-13 DIAGNOSIS — R109 Unspecified abdominal pain: Secondary | ICD-10-CM

## 2024-02-13 DIAGNOSIS — Z331 Pregnant state, incidental: Secondary | ICD-10-CM

## 2024-02-13 DIAGNOSIS — N200 Calculus of kidney: Secondary | ICD-10-CM

## 2024-02-13 LAB — URINALYSIS, COMPLETE
Bilirubin, UA: NEGATIVE
Glucose, UA: NEGATIVE
Ketones, UA: NEGATIVE
Nitrite, UA: NEGATIVE
Specific Gravity, UA: 1.02 (ref 1.005–1.030)
Urobilinogen, Ur: 1 mg/dL (ref 0.2–1.0)
pH, UA: 6.5 (ref 5.0–7.5)

## 2024-02-13 LAB — MICROSCOPIC EXAMINATION: Epithelial Cells (non renal): >10 /HPF — AB (ref 0–10)

## 2024-02-14 DIAGNOSIS — N200 Calculus of kidney: Secondary | ICD-10-CM

## 2024-02-14 NOTE — Progress Notes (Signed)
 02/13/2024 9:32 AM   [name] [facility] [birth year] [id number]  CC: Chief Complaint  Patient presents with   Flank pain with history of urolithiasis    HPI: Evelyn Caldwell is a 33 y.o. pregnant female at  104w4d with a history of nephrolithiasis who presents today for L&D triage follow-up.  She was seen acutely on 02/04/2024 with reports of right flank pain.  CT stone study showed physiologic right hydroureteronephrosis of pregnancy and bilateral nonobstructing renal stones, R>L measuring up to 8 mm.  No evidence of obstructing stones.  Today she reports her discomfort is largely resolved.  She would like to pursue elective stone management of her larger stones on the right side after she delivers.  She is scheduled for elective induction on 03/01/2024.  In-office UA today positive for trace protein, trace intact blood, and 1+ leukocytes; urine microscopy with 6-10 WBCs/HPF, 11-30 RBCs/HPF, >10 epithelial cells/hpf, and many bacteria.  PMH: Past Medical History:  Diagnosis Date   Abdominal pain, epigastric    Acne    Anxiety    Atypical chest pain    Erosive esophagitis    GERD (gastroesophageal reflux disease)    Headache, migraine 01/24/2015   Hiatal hernia    History of kidney stones    Internal hemorrhoids 08/18/2016   Kidney stone    Migraine headache    with season changes   Nephrolithiasis 10/22/2015   Renal ultrasound-mild right hydronephrosis likely consistent with physiologic hydronephrosis of pregnancy; no obstruction with bilateral ureteral jets seen; urinalysis notable for 2+ hemoglobin     Status post vacuum-assisted vaginal delivery 03/09/2016   Vulvar lesion     Surgical History: Past Surgical History:  Procedure Laterality Date   CYSTOSCOPY WITH STENT PLACEMENT Right 07/27/2018   Procedure: CYSTOSCOPY WITH STENT PLACEMENT;  Surgeon: Francisca Redell BROCKS, MD;  Location: [facility];  Service: Urology;  Laterality: Right;    CYSTOSCOPY/URETEROSCOPY/HOLMIUM LASER/STENT PLACEMENT Right 08/03/2018   Procedure: CYSTOSCOPY/URETEROSCOPY/HOLMIUM LASER/STENT EXCHANGE **WITH NST BEFORE AND AFTER**;  Surgeon: Penne Knee, MD;  Location: [facility];  Service: Urology;  Laterality: Right;   CYSTOSCOPY/URETEROSCOPY/HOLMIUM LASER/STENT PLACEMENT Right 03/02/2021   Procedure: CYSTOSCOPY/URETEROSCOPY/HOLMIUM LASER/STENT PLACEMENT;  Surgeon: Penne Knee, MD;  Location: [facility];  Service: Urology;  Laterality: Right;   ESOPHAGOGASTRODUODENOSCOPY (EGD) WITH PROPOFOL  N/A 02/18/2022   Procedure: ESOPHAGOGASTRODUODENOSCOPY (EGD) WITH PROPOFOL ;  Surgeon: Unk Corinn Skiff, MD;  Location: [facility];  Service: Endoscopy;  Laterality: N/A;   EXTRACORPOREAL SHOCK WAVE LITHOTRIPSY Left 02/15/2019   Procedure: EXTRACORPOREAL SHOCK WAVE LITHOTRIPSY (ESWL);  Surgeon: Penne Knee, MD;  Location: [facility];  Service: Urology;  Laterality: Left;   none      Home Medications:  Allergies as of 02/13/2024   No Known Allergies      Medication List        Accurate as of February 13, 2024 11:59 PM. If you have any questions, ask your nurse or doctor.          oxyCODONE  5 MG immediate release tablet Commonly known as: Oxy IR/ROXICODONE  Take 1 tablet (5 mg total) by mouth every 6 (six) hours as needed for breakthrough pain or severe pain (pain score 7-10).   pantoprazole  40 MG tablet Commonly known as: Protonix  Take 1 tablet (40 mg total) by mouth daily.   PRENATAL VITAMIN PO Take by mouth.   promethazine  25 MG tablet Commonly known as: PHENERGAN  Take 1 tablet (25 mg total) by mouth every 6 (six) hours as needed for nausea or vomiting.  tamsulosin  0.4 MG Caps capsule Commonly known as: FLOMAX  Take 1 capsule (0.4 mg total) by mouth daily.        Allergies:  No Known Allergies  Family History: Family History  Problem Relation Age of Onset   Alcohol abuse Father    Congestive Heart Failure Maternal  Grandfather    Parkinson's disease Paternal Grandmother    Kidney cancer Neg Hx    Prostate cancer Neg Hx    Breast cancer Neg Hx    Ovarian cancer Neg Hx    Colon cancer Neg Hx     Social History:   reports that she has never smoked. She has never used smokeless tobacco. She reports that she does not currently use alcohol. She reports that she does not use drugs.  Physical Exam: BP 120/77 (BP Location: Left Arm, Patient Position: Sitting, Cuff Size: Normal)   Pulse 93   Ht 5' 7 (1.702 m)   Wt 217 lb (98.4 kg)   LMP 06/02/2023   BMI 33.99 kg/m   Constitutional:  Alert and oriented, no acute distress, nontoxic appearing HEENT: [place], AT Cardiovascular: No clubbing, cyanosis, or edema Respiratory: Normal respiratory effort, no increased work of breathing GU: gravid uterus Skin: No rashes, bruises or suspicious lesions Neurologic: Grossly intact, no focal deficits, moving all 4 extremities Psychiatric: Normal mood and affect  Laboratory Data: Results for orders placed or performed in visit on 02/13/24  Microscopic Examination   Collection Time: 02/13/24 10:36 AM   Urine  Result Value Ref Range   WBC, UA 6-10 (A) 0 - 5 /hpf   RBC, Urine 11-30 (A) 0 - 2 /hpf   Epithelial Cells (non renal) >10 (A) 0 - 10 /hpf   Mucus, UA Present (A) Not Estab.   Bacteria, UA Many (A) None seen/Few  Urinalysis, Complete   Collection Time: 02/13/24 10:36 AM  Result Value Ref Range   Specific Gravity, UA 1.020 1.005 - 1.030   pH, UA 6.5 5.0 - 7.5   Color, UA Amber (A) Yellow   Appearance Ur Slightly cloudy Clear   Leukocytes,UA 1+ (A) Negative   Protein,UA Trace Negative/Trace   Glucose, UA Negative Negative   Ketones, UA Negative Negative   RBC, UA Trace (A) Negative   Bilirubin, UA Negative Negative   Urobilinogen, Ur 1.0 0.2 - 1.0 mg/dL   Nitrite, UA Negative Negative   Microscopic Examination See below:    Pertinent Imaging: Results for orders placed during the hospital encounter of  02/04/24  CT Renal Stone Study  Narrative CLINICAL DATA:  Abdominal and flank pain, [redacted] weeks pregnant, nephrolithiasis and right hydronephrosis on earlier ultrasound  EXAM: CT ABDOMEN AND PELVIS WITHOUT CONTRAST  TECHNIQUE: Multidetector CT imaging of the abdomen and pelvis was performed following the standard protocol without IV contrast.  RADIATION DOSE REDUCTION: This exam was performed according to the departmental dose-optimization program which includes automated exposure control, adjustment of the mA and/or kV according to patient size and/or use of iterative reconstruction technique.  COMPARISON:  04/15/2019, 02/04/2024  FINDINGS: Lower chest: No acute pleural or parenchymal lung disease.  Hepatobiliary: Unremarkable unenhanced appearance of the liver and gallbladder. No biliary duct dilation.  Pancreas: Unremarkable unenhanced appearance.  Spleen: Unremarkable unenhanced appearance.  Adrenals/Urinary Tract: The adrenals are unremarkable.  There are numerous bilateral renal calculi, measuring up to 4 mm on the left and 8 mm on the right. There is moderate right hydronephrosis and proximal right hydroureter, with no evidence of obstructing calculus. Obstruction may  be due to extrinsic mass effect by the enlarged gravid uterus. The bladder is decompressed, limiting its evaluation.  Stomach/Bowel: No bowel obstruction or ileus. Normal appendix right lower quadrant. No bowel wall thickening or inflammatory change.  Vascular/Lymphatic: No significant vascular findings are present. No enlarged abdominal or pelvic lymph nodes.  Reproductive: Gravid uterus is identified, with a single intrauterine pregnancy in cephalic presentation. Assessment of fetal anatomy is beyond the scope of this unenhanced CT, please refer to recent obstetric ultrasound exam. No adnexal masses.  Other: No free fluid or free intraperitoneal gas. No abdominal  wall hernia.  Musculoskeletal: No acute or destructive bony abnormalities. Reconstructed images demonstrate no additional findings.  IMPRESSION: 1. Multiple bilateral nonobstructing renal calculi as above. 2. Moderate right-sided hydronephrosis and proximal right hydroureter, with no evidence of obstructing ureteral calculus. Obstruction is likely due to extrinsic mass effect upon the mid right ureter by the enlarged gravid uterus. 3. Single intrauterine pregnancy in cephalic presentation. Please refer to recent obstetric ultrasound for full description of fetal anatomy.   Electronically Signed By: Ozell Daring M.D. On: 02/04/2024 20:39  I personally reviewed the images referenced above and note bilateral nonobstructing renal stones, R>L, with physiologic right hydroureteronephrosis due to pregnancy.  Assessment & Plan:   1. Bilateral nephrolithiasis (Primary) No evidence of acute urinary obstruction.  Agree with elective management of her right renal stones after she delivers.  I will speak with Dr. Francisca further about recommended timing and contact the patient with recommendations.  UA today is suspicious, though appears contaminated.  She is not showing any signs of UTI, so overall my clinical suspicion for UTI is low.  Will send for culture regardless in light of her pregnancy. - Urinalysis, Complete - CULTURE, URINE COMPREHENSIVE  Return for Will call with plan.  Lucie Hones, PA-C  [facility] [address] [place], [place] [address] [phone]

## 2024-02-16 ENCOUNTER — Ambulatory Visit: Payer: Self-pay | Admitting: Family Medicine

## 2024-02-16 VITALS — BP 115/77 | HR 90 | Wt 217.6 lb

## 2024-02-16 DIAGNOSIS — Z3A37 37 weeks gestation of pregnancy: Secondary | ICD-10-CM

## 2024-02-16 DIAGNOSIS — O99891 Other specified diseases and conditions complicating pregnancy: Secondary | ICD-10-CM

## 2024-02-16 DIAGNOSIS — N200 Calculus of kidney: Secondary | ICD-10-CM

## 2024-02-16 DIAGNOSIS — Z3483 Encounter for supervision of other normal pregnancy, third trimester: Secondary | ICD-10-CM

## 2024-02-16 LAB — CULTURE, URINE COMPREHENSIVE

## 2024-02-16 NOTE — Progress Notes (Signed)
 Pt presents for rob. Pt has no questions or concerns at this time.

## 2024-02-16 NOTE — Progress Notes (Signed)
    PRENATAL VISIT NOTE  Subjective:  Evelyn Caldwell is a 33 y.o. G3P2002 at [redacted]w[redacted]d being seen today for ongoing prenatal care.  She is currently monitored for the following issues for this low-risk pregnancy and has Supervision of normal pregnancy; Obesity affecting pregnancy, antepartum; Consultation for female sterilization; and Pregnancy complicated by nephrolithiasis in third trimester, antepartum on their problem list.  Patient reports no complaints.  Contractions: Irritability. Vag. Bleeding: None.  Movement: Present. Denies leaking of fluid.   The following portions of the patient's history were reviewed and updated as appropriate: allergies, current medications, past family history, past medical history, past social history, past surgical history and problem list.   Objective:    Vitals:   02/16/24 1027  BP: 115/77  Pulse: 90  Weight: 217 lb 9.6 oz (98.7 kg)    Fetal Status:  Fetal Heart Rate (bpm): 148 Fundal Height: 37 cm Movement: Present Presentation: Vertex  General: Alert, oriented and cooperative. Patient is in no acute distress.  Skin: Skin is warm and dry. No rash noted.   Cardiovascular: Normal heart rate noted  Respiratory: Normal respiratory effort, no problems with respiration noted  Abdomen: Soft, gravid, appropriate for gestational age.  Pain/Pressure: Absent     Pelvic: Cervical exam deferred        Extremities: Normal range of motion.  Edema: Trace  Mental Status: Normal mood and affect. Normal behavior. Normal judgment and thought content.   Assessment and Plan:  Pregnancy: G3P2002 at [redacted]w[redacted]d 1. Pregnancy complicated by nephrolithiasis in third trimester, antepartum (Primary) On flomax  Has plan post delivery  2. Encounter for supervision of other normal pregnancy in third trimester Continue prenatal care.  3. [redacted] weeks gestation of pregnancy   Term labor symptoms and general obstetric precautions including but not limited to vaginal  bleeding, contractions, leaking of fluid and fetal movement were reviewed in detail with the patient. Please refer to After Visit Summary for other counseling recommendations.   Return in 1 week (on 02/23/2024).  Future Appointments  Date Time Provider Department Center  02/23/2024 10:35 AM Anyanwu, Gloris LABOR, MD [facility]  03/01/2024 12:00 AM [facility] SCHED ROOM [facility] None  05/14/2024 10:30 AM Jackquline Sawyer, MD [facility] None    Glenys GORMAN Birk, MD

## 2024-02-17 ENCOUNTER — Ambulatory Visit: Payer: Self-pay | Admitting: Physician Assistant

## 2024-02-23 ENCOUNTER — Telehealth (HOSPITAL_COMMUNITY): Payer: Self-pay | Admitting: *Deleted

## 2024-02-23 ENCOUNTER — Encounter: Payer: Self-pay | Admitting: Obstetrics & Gynecology

## 2024-02-23 ENCOUNTER — Ambulatory Visit (INDEPENDENT_AMBULATORY_CARE_PROVIDER_SITE_OTHER): Payer: Self-pay | Admitting: Obstetrics & Gynecology

## 2024-02-23 ENCOUNTER — Encounter (HOSPITAL_COMMUNITY): Payer: Self-pay | Admitting: *Deleted

## 2024-02-23 VITALS — BP 115/76 | HR 87 | Wt 214.0 lb

## 2024-02-23 DIAGNOSIS — Z3A38 38 weeks gestation of pregnancy: Secondary | ICD-10-CM

## 2024-02-23 DIAGNOSIS — Z3483 Encounter for supervision of other normal pregnancy, third trimester: Secondary | ICD-10-CM

## 2024-02-23 DIAGNOSIS — N200 Calculus of kidney: Secondary | ICD-10-CM

## 2024-02-23 DIAGNOSIS — O99891 Other specified diseases and conditions complicating pregnancy: Secondary | ICD-10-CM

## 2024-02-23 NOTE — Telephone Encounter (Signed)
 Preadmission screen

## 2024-02-23 NOTE — Patient Instructions (Signed)

## 2024-02-23 NOTE — Progress Notes (Signed)
   PRENATAL VISIT NOTE  Subjective:  Evelyn Caldwell is a 33 y.o. G3P2002 at [redacted]w[redacted]d being seen today for ongoing prenatal care.  She is currently monitored for the following issues for this low-risk pregnancy and has Supervision of normal pregnancy; Obesity affecting pregnancy, antepartum; Consultation for female sterilization; and Pregnancy complicated by nephrolithiasis in third trimester, antepartum on their problem list.  Patient reports passing kidney stone yesterday, no current symptoms.  Contractions: Irritability. Vag. Bleeding: None.  Movement: Present. Denies leaking of fluid.   The following portions of the patient's history were reviewed and updated as appropriate: allergies, current medications, past family history, past medical history, past social history, past surgical history and problem list.   Objective:    Vitals:   02/23/24 1044  BP: 115/76  Pulse: 87  Weight: 214 lb (97.1 kg)    Fetal Status:  Fetal Heart Rate (bpm): 143 Fundal Height: 39 cm Movement: Present    General: Alert, oriented and cooperative. Patient is in no acute distress.  Skin: Skin is warm and dry. No rash noted.   Cardiovascular: Normal heart rate noted  Respiratory: Normal respiratory effort, no problems with respiration noted  Abdomen: Soft, gravid, appropriate for gestational age.  Pain/Pressure: Absent     Pelvic: Cervical exam deferred        Extremities: Normal range of motion.     Mental Status: Normal mood and affect. Normal behavior. Normal judgment and thought content.   Assessment and Plan:  Pregnancy: G3P2002 at [redacted]w[redacted]d 1. Pregnancy complicated by nephrolithiasis in third trimester, antepartum 2. [redacted] weeks gestation of pregnancy 3. Encounter for supervision of other normal pregnancy in third trimester (Primary) Scheduled for IOL 03/01/24 midnight, orders signed and held Labor symptoms and general obstetric precautions including but not limited to vaginal bleeding,  contractions, leaking of fluid and fetal movement were reviewed in detail with the patient. Please refer to After Visit Summary for other counseling recommendations.   Return for Postpartum check.  Future Appointments  Date Time Provider Department Center  03/01/2024 12:00 AM [facility] SCHED ROOM [facility] None  05/14/2024 10:30 AM Jackquline Sawyer, MD [facility] None    Gloris Hugger, MD

## 2024-02-29 ENCOUNTER — Other Ambulatory Visit: Payer: Self-pay | Admitting: Advanced Practice Midwife

## 2024-02-29 NOTE — H&P (Incomplete)
 OBSTETRIC ADMISSION HISTORY AND PHYSICAL  Marely Apgar is a 33 y.o. female [phone] with IUP at [redacted]w[redacted]d by LMP presenting for IOL due to worsening pain from kidney stones. She reports +FMs, No LOF, no VB, no blurry vision, headaches, SOB, or RUQ pain. She reports mild pedal edema. She plans on breast feeding. She request interal BTL for birth control. She received her prenatal care at [facility]   Dating: By LMP --->  Estimated Date of Delivery: 03/08/24  Sono:    @[redacted]w[redacted]d , CWD, normal anatomy, cephalic presentation, Est. FW: 2699 gm, 5 lb 15 oz, 70 %    Prenatal History/Complications: pregnancy complicated by nephrolithiasis   Past Medical History: Past Medical History:  Diagnosis Date   Abdominal pain, epigastric    Acne    Anxiety    Atypical chest pain    Erosive esophagitis    GERD (gastroesophageal reflux disease)    Headache, migraine 01/24/2015   Hiatal hernia    History of kidney stones    Internal hemorrhoids 08/18/2016   Kidney stone    Migraine headache    with season changes   Nephrolithiasis 10/22/2015   Renal ultrasound-mild right hydronephrosis likely consistent with physiologic hydronephrosis of pregnancy; no obstruction with bilateral ureteral jets seen; urinalysis notable for 2+ hemoglobin     Status post vacuum-assisted vaginal delivery 03/09/2016   Vulvar lesion     Past Surgical History: Past Surgical History:  Procedure Laterality Date   CYSTOSCOPY WITH STENT PLACEMENT Right 07/27/2018   Procedure: CYSTOSCOPY WITH STENT PLACEMENT;  Surgeon: Francisca Redell BROCKS, MD;  Location: [facility];  Service: Urology;  Laterality: Right;   CYSTOSCOPY/URETEROSCOPY/HOLMIUM LASER/STENT PLACEMENT Right 08/03/2018   Procedure: CYSTOSCOPY/URETEROSCOPY/HOLMIUM LASER/STENT EXCHANGE **WITH NST BEFORE AND AFTER**;  Surgeon: Penne Knee, MD;  Location: [facility];  Service: Urology;  Laterality: Right;   CYSTOSCOPY/URETEROSCOPY/HOLMIUM LASER/STENT PLACEMENT Right 03/02/2021    Procedure: CYSTOSCOPY/URETEROSCOPY/HOLMIUM LASER/STENT PLACEMENT;  Surgeon: Penne Knee, MD;  Location: [facility];  Service: Urology;  Laterality: Right;   ESOPHAGOGASTRODUODENOSCOPY (EGD) WITH PROPOFOL  N/A 02/18/2022   Procedure: ESOPHAGOGASTRODUODENOSCOPY (EGD) WITH PROPOFOL ;  Surgeon: Unk Corinn Skiff, MD;  Location[facility];  Service: Endoscopy;  Laterality: N/A;   EXTRACORPOREAL SHOCK WAVE LITHOTRIPSY Left 02/15/2019   Procedure: EXTRACORPOREAL SHOCK WAVE LITHOTRIPSY (ESWL);  Surgeon: Penne Knee, MD;  Location: [facility];  Service: Urology;  Laterality: Left;   none      Obstetrical History: OB History     Gravida  3   Para  2   Term  2   Preterm      AB      Living  2      SAB      IAB      Ectopic      Multiple  0   Live Births  2           Social History Social History   Socioeconomic History   Marital status: Married    Spouse name: Not on file   Number of children: Not on file   Years of education: Not on file   Highest education level: Not on file  Occupational History   Not on file  Tobacco Use   Smoking status: Never   Smokeless tobacco: Never  Vaping Use   Vaping status: Never Used  Substance and Sexual Activity   Alcohol use: Not Currently    Comment: not since being pregnant   Drug use: No   Sexual activity: Yes  Other Topics  Concern   Not on file  Social History Narrative   Not on file   Social Drivers of Health   Financial Resource Strain: Low Risk  (10/18/2018)   Overall Financial Resource Strain (CARDIA)    Difficulty of Paying Living Expenses: Not hard at all  Food Insecurity: No Food Insecurity (03/01/2024)   Hunger Vital Sign    Worried About Running Out of Food in the Last Year: Never true    Ran Out of Food in the Last Year: Never true  Transportation Needs: No Transportation Needs (03/01/2024)   PRAPARE - Administrator, Civil Service (Medical): No    Lack of Transportation  (Non-Medical): No  Physical Activity: Unknown (10/18/2018)   Exercise Vital Sign    Days of Exercise per Week: Patient declined    Minutes of Exercise per Session: Patient declined  Stress: No Stress Concern Present (07/22/2023)   [employer] of Occupational Health - Occupational Stress Questionnaire    Feeling of Stress : Not at all  Social Connections: Unknown (10/18/2018)   Social Connection and Isolation Panel    Frequency of Communication with Friends and Family: Patient declined    Frequency of Social Gatherings with Friends and Family: Patient declined    Attends Religious Services: Patient declined    Database administrator or Organizations: Patient declined    Attends Engineer, structural: Patient declined    Marital Status: Patient declined    Family History: Family History  Problem Relation Age of Onset   Alcohol abuse Father    Congestive Heart Failure Maternal Grandfather    Parkinson's disease Paternal Grandmother    Kidney cancer Neg Hx    Prostate cancer Neg Hx    Breast cancer Neg Hx    Ovarian cancer Neg Hx    Colon cancer Neg Hx     Allergies: No Known Allergies  Medications Prior to Admission  Medication Sig Dispense Refill Last Dose/Taking   oxyCODONE  (OXY IR/ROXICODONE ) 5 MG immediate release tablet Take 1 tablet (5 mg total) by mouth every 6 (six) hours as needed for breakthrough pain or severe pain (pain score 7-10). 15 tablet 0 Past Month   pantoprazole  (PROTONIX ) 40 MG tablet Take 1 tablet (40 mg total) by mouth daily. 30 tablet 2 Past Week   Prenatal Vit-Fe Fumarate-FA (PRENATAL VITAMIN PO) Take by mouth.   02/29/2024   promethazine  (PHENERGAN ) 25 MG tablet Take 1 tablet (25 mg total) by mouth every 6 (six) hours as needed for nausea or vomiting. 30 tablet 0 Past Week   tamsulosin  (FLOMAX ) 0.4 MG CAPS capsule Take 1 capsule (0.4 mg total) by mouth daily. 30 capsule 2 Past Month     Review of Systems   All systems reviewed and  negative except as stated in HPI  Blood pressure 118/64, pulse 71, temperature 97.6 F (36.4 C), temperature source Oral, resp. rate 18, height 5' 7 (1.702 m), weight 98.1 kg, last menstrual period 06/02/2023, SpO2 100%. General appearance: alert, cooperative, and no distress Lungs: normal respiratory effort  Heart: regular rate  Abdomen: gravid Presentation: cephalic Fetal monitoring Baseline: 135 bpm, Variability: moderate, Accelerations: present, and Decelerations: Absent Uterine activity: contractions present Dilation: 4 Effacement (%): 60 Station: -3 Exam by:: Luke Gentry, CNM   Prenatal labs: ABO, Rh: --/--/A POS (10/02 0025) Antibody: NEG (10/02 0025) Rubella: 3.57 (02/21 1106) RPR: NON REACTIVE (10/02 0022)  HBsAg: Negative (02/21 1106)  HIV: Non Reactive (07/15 0959)  GBS: Negative/-- (09/11 1100)  Lab Results  Component Value Date   GBS Negative 02/09/2024   Third trimester : nml 2 hr GTT  Genetic screening: maternity21- negative  Anatomy US  normal  Immunization History  Administered Date(s) Administered   Influenza, Seasonal, Injecte, Preservative Fre 02/09/2024   Influenza,inj,Quad PF,6+ Mos 04/01/2015, 03/11/2016, 07/31/2018, 06/29/2019   MMR 03/11/2016   Tdap 12/16/2015, 08/08/2018, 12/13/2023   Varicella 03/11/2016    Prenatal Transfer Tool  Maternal Diabetes: No Genetic Screening: Normal Maternal Ultrasounds/Referrals: Normal Fetal Ultrasounds or other Referrals:  Referred to Materal Fetal Medicine  Maternal Substance Abuse:  No Significant Maternal Medications:  Meds include: Protonix , Flomax , Oxycodone   Significant Maternal Lab Results: Group B Strep negative Number of Prenatal Visits:greater than 3 verified prenatal visits Maternal Vaccinations:TDap and Flu Other Comments:  None   Results for orders placed or performed during the hospital encounter of 03/01/24 (from the past 24 hours)  CBC   Collection Time: 03/01/24 12:22 AM  Result Value  Ref Range   WBC 7.4 4.0 - 10.5 K/uL   RBC 3.39 (L) 3.87 - 5.11 MIL/uL   Hemoglobin 9.2 (L) 12.0 - 15.0 g/dL   HCT 72.4 (L) 63.9 - 53.9 %   MCV 81.1 80.0 - 100.0 fL   MCH 27.1 26.0 - 34.0 pg   MCHC 33.5 30.0 - 36.0 g/dL   RDW 85.4 88.4 - 84.4 %   Platelets 259 150 - 400 K/uL   nRBC 0.0 0.0 - 0.2 %  RPR   Collection Time: 03/01/24 12:22 AM  Result Value Ref Range   RPR Ser Ql NON REACTIVE NON REACTIVE  Type and screen   Collection Time: 03/01/24 12:25 AM  Result Value Ref Range   ABO/RH(D) A POS    Antibody Screen NEG    Sample Expiration      03/04/2024,2359 Performed at [facility], [address], [place], [place] [address]     Patient Active Problem List   Diagnosis Date Noted   Pregnancy complicated by nephrolithiasis in third trimester, antepartum 02/09/2024   Consultation for female sterilization 11/15/2023   Obesity affecting pregnancy, antepartum 10/06/2023   Supervision of normal pregnancy 07/22/2023    Assessment/Plan:  Katalaya Beel is a 33 y.o. G3P2002 at [redacted]w[redacted]d here for IOL due to worsening pain from kidney stones.  #Labor: Starting pitocin ; plan for AROM when ctx regular #Pain: Planning on epidural  #FWB: Cat 1 #GBS status:  negative #Feeding: Breastmilk  #Reproductive Life planning: Tubal Ligation (interval as is self pay) #Circ:  yes  Suzen JONETTA Gentry, CNM  03/01/2024, 10:39 AM

## 2024-03-01 ENCOUNTER — Other Ambulatory Visit: Payer: Self-pay

## 2024-03-01 ENCOUNTER — Inpatient Hospital Stay (HOSPITAL_COMMUNITY): Payer: Self-pay | Admitting: Anesthesiology

## 2024-03-01 ENCOUNTER — Encounter (HOSPITAL_COMMUNITY)
Admission: RE | Disposition: A | Discharge status: Home / Self Care | Payer: Self-pay | Source: Home / Self Care | Attending: Obstetrics & Gynecology

## 2024-03-01 ENCOUNTER — Encounter (HOSPITAL_COMMUNITY): Payer: Self-pay | Admitting: Obstetrics & Gynecology

## 2024-03-01 ENCOUNTER — Inpatient Hospital Stay (HOSPITAL_COMMUNITY): Payer: Self-pay

## 2024-03-01 ENCOUNTER — Inpatient Hospital Stay (HOSPITAL_COMMUNITY)
Admission: RE | Admit: 2024-03-01 | Discharge: 2024-03-03 | DRG: 784 | Disposition: A | Discharge status: Home / Self Care | Payer: Self-pay | Attending: Obstetrics & Gynecology | Admitting: Obstetrics & Gynecology

## 2024-03-01 DIAGNOSIS — O99891 Other specified diseases and conditions complicating pregnancy: Principal | ICD-10-CM

## 2024-03-01 DIAGNOSIS — O324XX0 Maternal care for high head at term, not applicable or unspecified: Secondary | ICD-10-CM | POA: Diagnosis present

## 2024-03-01 DIAGNOSIS — N200 Calculus of kidney: Secondary | ICD-10-CM

## 2024-03-01 DIAGNOSIS — O26833 Pregnancy related renal disease, third trimester: Principal | ICD-10-CM | POA: Diagnosis present

## 2024-03-01 DIAGNOSIS — Z98891 History of uterine scar from previous surgery: Secondary | ICD-10-CM

## 2024-03-01 DIAGNOSIS — Z3490 Encounter for supervision of normal pregnancy, unspecified, unspecified trimester: Secondary | ICD-10-CM

## 2024-03-01 DIAGNOSIS — O9962 Diseases of the digestive system complicating childbirth: Secondary | ICD-10-CM | POA: Diagnosis present

## 2024-03-01 DIAGNOSIS — O99214 Obesity complicating childbirth: Secondary | ICD-10-CM | POA: Diagnosis present

## 2024-03-01 DIAGNOSIS — Z79899 Other long term (current) drug therapy: Secondary | ICD-10-CM

## 2024-03-01 DIAGNOSIS — D62 Acute posthemorrhagic anemia: Secondary | ICD-10-CM | POA: Diagnosis not present

## 2024-03-01 DIAGNOSIS — Z3A39 39 weeks gestation of pregnancy: Secondary | ICD-10-CM

## 2024-03-01 DIAGNOSIS — Z302 Encounter for sterilization: Secondary | ICD-10-CM

## 2024-03-01 DIAGNOSIS — O9081 Anemia of the puerperium: Secondary | ICD-10-CM | POA: Diagnosis not present

## 2024-03-01 DIAGNOSIS — K219 Gastro-esophageal reflux disease without esophagitis: Secondary | ICD-10-CM | POA: Diagnosis present

## 2024-03-01 DIAGNOSIS — O99210 Obesity complicating pregnancy, unspecified trimester: Secondary | ICD-10-CM | POA: Diagnosis present

## 2024-03-01 DIAGNOSIS — Z8249 Family history of ischemic heart disease and other diseases of the circulatory system: Secondary | ICD-10-CM

## 2024-03-01 DIAGNOSIS — Z3009 Encounter for other general counseling and advice on contraception: Secondary | ICD-10-CM | POA: Diagnosis present

## 2024-03-01 DIAGNOSIS — Z3A38 38 weeks gestation of pregnancy: Secondary | ICD-10-CM

## 2024-03-01 LAB — CBC
HCT: 27.5 % — ABNORMAL LOW (ref 36.0–46.0)
Hemoglobin: 9.2 g/dL — ABNORMAL LOW (ref 12.0–15.0)
MCH: 27.1 pg (ref 26.0–34.0)
MCHC: 33.5 g/dL (ref 30.0–36.0)
MCV: 81.1 fL (ref 80.0–100.0)
Platelets: 259 K/uL (ref 150–400)
RBC: 3.39 MIL/uL — ABNORMAL LOW (ref 3.87–5.11)
RDW: 14.5 % (ref 11.5–15.5)
WBC: 7.4 K/uL (ref 4.0–10.5)
nRBC: 0 % (ref 0.0–0.2)

## 2024-03-01 LAB — RPR: RPR Ser Ql: NONREACTIVE

## 2024-03-01 SURGERY — Surgical Case
Anesthesia: Epidural

## 2024-03-01 MED ORDER — FENTANYL CITRATE (PF) 100 MCG/2ML IJ SOLN
INTRAMUSCULAR | Status: AC
  Filled 2024-03-01: qty 2

## 2024-03-01 MED ORDER — DEXAMETHASONE SODIUM PHOSPHATE 10 MG/ML IJ SOLN
INTRAMUSCULAR | Status: DC | PRN
  Administered 2024-03-01: 10 mg via INTRAVENOUS

## 2024-03-01 MED ORDER — PHENYLEPHRINE 80 MCG/ML (10ML) SYRINGE FOR IV PUSH (FOR BLOOD PRESSURE SUPPORT)
PREFILLED_SYRINGE | INTRAVENOUS | Status: DC | PRN
  Administered 2024-03-01: 160 ug via INTRAVENOUS
  Administered 2024-03-01: 80 ug via INTRAVENOUS
  Administered 2024-03-01: 160 ug via INTRAVENOUS

## 2024-03-01 MED ORDER — MORPHINE SULFATE (PF) 0.5 MG/ML IJ SOLN
INTRAMUSCULAR | Status: AC
  Filled 2024-03-01: qty 10

## 2024-03-01 MED ORDER — ACETAMINOPHEN 325 MG PO TABS: 650.0000 mg | ORAL_TABLET | ORAL | Status: DC | PRN

## 2024-03-01 MED ORDER — OXYTOCIN-SODIUM CHLORIDE 30-0.9 UT/500ML-% IV SOLN
1.0000 m[IU]/min | INTRAVENOUS | Status: DC
  Administered 2024-03-01: 2 m[IU]/min via INTRAVENOUS
  Filled 2024-03-01: qty 500

## 2024-03-01 MED ORDER — SUCROSE 24% NICU/PEDS ORAL SOLUTION: 0.5000 mL | OROMUCOSAL | Status: DC | PRN

## 2024-03-01 MED ORDER — VASELINE PETROLATUM GAUZE EX PADS: 1.0000 | MEDICATED_PAD | Freq: Once | CUTANEOUS | Status: DC | PRN

## 2024-03-01 MED ORDER — DIPHENHYDRAMINE HCL 50 MG/ML IJ SOLN: 12.5000 mg | INTRAMUSCULAR | Status: DC | PRN

## 2024-03-01 MED ORDER — FENTANYL CITRATE (PF) 100 MCG/2ML IJ SOLN: 50.0000 ug | INTRAMUSCULAR | Status: DC | PRN

## 2024-03-01 MED ORDER — CEFAZOLIN SODIUM-DEXTROSE 2-4 GM/100ML-% IV SOLN: 2.0000 g | INTRAVENOUS | Status: DC

## 2024-03-01 MED ORDER — EPINEPHRINE TOPICAL FOR CIRCUMCISION 0.1 MG/ML: 1.0000 [drp] | TOPICAL | Status: DC | PRN

## 2024-03-01 MED ORDER — PHENYLEPHRINE 80 MCG/ML (10ML) SYRINGE FOR IV PUSH (FOR BLOOD PRESSURE SUPPORT): 80.0000 ug | PREFILLED_SYRINGE | INTRAVENOUS | Status: DC | PRN

## 2024-03-01 MED ORDER — EPHEDRINE 5 MG/ML INJ: 10.0000 mg | INTRAVENOUS | Status: DC | PRN

## 2024-03-01 MED ORDER — OXYTOCIN-SODIUM CHLORIDE 30-0.9 UT/500ML-% IV SOLN: 2.5000 [IU]/h | INTRAVENOUS | Status: DC

## 2024-03-01 MED ORDER — ACETAMINOPHEN 10 MG/ML IV SOLN
INTRAVENOUS | Status: DC | PRN
  Administered 2024-03-01: 1000 mg via INTRAVENOUS

## 2024-03-01 MED ORDER — OXYTOCIN BOLUS FROM INFUSION: 333.0000 mL | Freq: Once | INTRAVENOUS | Status: DC

## 2024-03-01 MED ORDER — MISOPROSTOL 50MCG HALF TABLET: 50.0000 ug | ORAL_TABLET | Freq: Once | ORAL | Status: DC

## 2024-03-01 MED ORDER — MORPHINE SULFATE (PF) 0.5 MG/ML IJ SOLN
INTRAMUSCULAR | Status: DC | PRN
  Administered 2024-03-01: 3 mg via EPIDURAL

## 2024-03-01 MED ORDER — WHITE PETROLATUM EX OINT: 1.0000 | TOPICAL_OINTMENT | CUTANEOUS | Status: DC | PRN

## 2024-03-01 MED ORDER — HYDROXYZINE HCL 50 MG PO TABS: 50.0000 mg | ORAL_TABLET | Freq: Four times a day (QID) | ORAL | Status: DC | PRN

## 2024-03-01 MED ORDER — ZOLPIDEM TARTRATE 5 MG PO TABS: 5.0000 mg | ORAL_TABLET | Freq: Every evening | ORAL | Status: DC | PRN

## 2024-03-01 MED ORDER — PHENYLEPHRINE 80 MCG/ML (10ML) SYRINGE FOR IV PUSH (FOR BLOOD PRESSURE SUPPORT)
80.0000 ug | PREFILLED_SYRINGE | INTRAVENOUS | Status: DC | PRN
  Filled 2024-03-01: qty 10

## 2024-03-01 MED ORDER — ONDANSETRON HCL 4 MG/2ML IJ SOLN
INTRAMUSCULAR | Status: DC | PRN
  Administered 2024-03-01: 4 mg via INTRAVENOUS

## 2024-03-01 MED ORDER — OXYCODONE-ACETAMINOPHEN 5-325 MG PO TABS: 2.0000 | ORAL_TABLET | ORAL | Status: DC | PRN

## 2024-03-01 MED ORDER — TAMSULOSIN HCL 0.4 MG PO CAPS
0.4000 mg | ORAL_CAPSULE | Freq: Every day | ORAL | Status: DC
Start: 2024-03-01 — End: 2024-03-03
  Administered 2024-03-01 – 2024-03-03 (×3): 0.4 mg via ORAL
  Filled 2024-03-01 (×3): qty 1

## 2024-03-01 MED ORDER — SOD CITRATE-CITRIC ACID 500-334 MG/5ML PO SOLN: 30.0000 mL | ORAL | Status: DC

## 2024-03-01 MED ORDER — TRANEXAMIC ACID-NACL 1000-0.7 MG/100ML-% IV SOLN
1000.0000 mg | Freq: Once | INTRAVENOUS | Status: DC
Start: 2024-03-01 — End: 2024-03-02

## 2024-03-01 MED ORDER — FLEET ENEMA RE ENEM: 1.0000 | ENEMA | Freq: Every day | RECTAL | Status: DC | PRN

## 2024-03-01 MED ORDER — LACTATED RINGERS IV SOLN: 500.0000 mL | Freq: Once | INTRAVENOUS | Status: DC

## 2024-03-01 MED ORDER — SODIUM CHLORIDE 0.9 % IV SOLN
INTRAVENOUS | Status: DC | PRN
  Administered 2024-03-01: 500 mg via INTRAVENOUS

## 2024-03-01 MED ORDER — STERILE WATER FOR IRRIGATION IR SOLN
Status: DC | PRN
  Administered 2024-03-01: 1000 mL

## 2024-03-01 MED ORDER — ALBUMIN HUMAN 5 % IV SOLN
INTRAVENOUS | Status: DC | PRN
Start: 2024-03-01 — End: 2024-03-02

## 2024-03-01 MED ORDER — LIDOCAINE-EPINEPHRINE (PF) 2 %-1:200000 IJ SOLN
INTRAMUSCULAR | Status: DC | PRN
  Administered 2024-03-01: 5 mL via EPIDURAL
  Administered 2024-03-01: 3 mL via EPIDURAL
  Administered 2024-03-01: 2 mL via EPIDURAL
  Administered 2024-03-01: 5 mL via EPIDURAL
  Administered 2024-03-01: 2 mL via EPIDURAL

## 2024-03-01 MED ORDER — LIDOCAINE HCL (PF) 1 % IJ SOLN
INTRAMUSCULAR | Status: DC | PRN
  Administered 2024-03-01 (×2): 4 mL via EPIDURAL

## 2024-03-01 MED ORDER — LACTATED RINGERS IV SOLN: INTRAVENOUS | Status: AC

## 2024-03-01 MED ORDER — ONDANSETRON HCL 4 MG/2ML IJ SOLN
4.0000 mg | Freq: Four times a day (QID) | INTRAMUSCULAR | Status: DC | PRN
  Administered 2024-03-01: 4 mg via INTRAVENOUS
  Filled 2024-03-01: qty 2

## 2024-03-01 MED ORDER — TRANEXAMIC ACID-NACL 1000-0.7 MG/100ML-% IV SOLN
INTRAVENOUS | Status: DC | PRN
Start: 2024-03-01 — End: 2024-03-02
  Administered 2024-03-01: 1000 mg via INTRAVENOUS

## 2024-03-01 MED ORDER — SOD CITRATE-CITRIC ACID 500-334 MG/5ML PO SOLN
30.0000 mL | ORAL | Status: DC | PRN
  Administered 2024-03-01: 30 mL via ORAL
  Filled 2024-03-01: qty 30

## 2024-03-01 MED ORDER — LACTATED RINGERS IV SOLN
500.0000 mL | INTRAVENOUS | Status: AC | PRN
  Administered 2024-03-01: 500 mL via INTRAVENOUS

## 2024-03-01 MED ORDER — FENTANYL CITRATE (PF) 100 MCG/2ML IJ SOLN
INTRAMUSCULAR | Status: DC | PRN
  Administered 2024-03-01: 100 ug via EPIDURAL

## 2024-03-01 MED ORDER — LACTATED RINGERS IV SOLN: INTRAVENOUS | Status: DC | PRN

## 2024-03-01 MED ORDER — CEFAZOLIN SODIUM-DEXTROSE 2-3 GM-%(50ML) IV SOLR
INTRAVENOUS | Status: DC | PRN
  Administered 2024-03-01: 2 g via INTRAVENOUS

## 2024-03-01 MED ORDER — OXYCODONE-ACETAMINOPHEN 5-325 MG PO TABS: 1.0000 | ORAL_TABLET | ORAL | Status: DC | PRN

## 2024-03-01 MED ORDER — DIPHENHYDRAMINE HCL 50 MG/ML IJ SOLN
INTRAMUSCULAR | Status: DC | PRN
Start: 2024-03-01 — End: 2024-03-02
  Administered 2024-03-01: 12.5 mg via INTRAVENOUS

## 2024-03-01 MED ORDER — FAMOTIDINE IN NACL 20-0.9 MG/50ML-% IV SOLN
20.0000 mg | Freq: Two times a day (BID) | INTRAVENOUS | Status: DC
  Administered 2024-03-01 (×2): 20 mg via INTRAVENOUS
  Filled 2024-03-01 (×5): qty 50

## 2024-03-01 MED ORDER — OXYTOCIN-SODIUM CHLORIDE 30-0.9 UT/500ML-% IV SOLN
INTRAVENOUS | Status: DC | PRN
  Administered 2024-03-01: 300 mL via INTRAVENOUS

## 2024-03-01 MED ORDER — MISOPROSTOL 25 MCG QUARTER TABLET: 25.0000 ug | ORAL_TABLET | Freq: Once | ORAL | Status: DC

## 2024-03-01 MED ORDER — MORPHINE SULFATE (PF) 0.5 MG/ML IJ SOLN
INTRAMUSCULAR | Status: DC | PRN
Start: 2024-03-01 — End: 2024-03-02
  Administered 2024-03-01: 2 mg via INTRAVENOUS

## 2024-03-01 MED ORDER — TERBUTALINE SULFATE 1 MG/ML IJ SOLN: 0.2500 mg | Freq: Once | INTRAMUSCULAR | Status: DC | PRN

## 2024-03-01 MED ORDER — LIDOCAINE 1% INJECTION FOR CIRCUMCISION: 0.8000 mL | INJECTION | Freq: Once | INTRAVENOUS | Status: DC

## 2024-03-01 MED ORDER — FENTANYL-BUPIVACAINE-NACL 0.5-0.125-0.9 MG/250ML-% EP SOLN
12.0000 mL/h | EPIDURAL | Status: DC | PRN
  Administered 2024-03-01: 12 mL/h via EPIDURAL
  Filled 2024-03-01: qty 250

## 2024-03-01 MED ORDER — LIDOCAINE HCL (PF) 1 % IJ SOLN: 30.0000 mL | INTRAMUSCULAR | Status: DC | PRN

## 2024-03-01 MED ORDER — SODIUM CHLORIDE 0.9 % IV SOLN: 500.0000 mg | INTRAVENOUS | Status: DC

## 2024-03-01 SURGICAL SUPPLY — 33 items
BENZOIN TINCTURE PRP APPL 2/3 (GAUZE/BANDAGES/DRESSINGS) IMPLANT
CHLORAPREP W/TINT 26 (MISCELLANEOUS) ×2 IMPLANT
CLAMP UMBILICAL CORD (MISCELLANEOUS) ×1 IMPLANT
CLOTH BEACON ORANGE TIMEOUT ST (SAFETY) ×1 IMPLANT
DERMABOND ADVANCED .7 DNX12 (GAUZE/BANDAGES/DRESSINGS) IMPLANT
DISSECTOR SURG LIGASURE 21 (MISCELLANEOUS) IMPLANT
DRSG OPSITE POSTOP 4X10 (GAUZE/BANDAGES/DRESSINGS) ×1 IMPLANT
ELECTRODE REM PT RTRN 9FT ADLT (ELECTROSURGICAL) ×1 IMPLANT
EXTRACTOR VACUUM KIWI (MISCELLANEOUS) IMPLANT
GAUZE PAD ABD 7.5X8 STRL (GAUZE/BANDAGES/DRESSINGS) IMPLANT
GAUZE SPONGE 4X4 12PLY STRL LF (GAUZE/BANDAGES/DRESSINGS) IMPLANT
GLOVE SURG ORTHO 8.0 STRL STRW (GLOVE) ×1 IMPLANT
GOWN STRL REUS W/TWL LRG LVL3 (GOWN DISPOSABLE) ×2 IMPLANT
KIT ABG SYR 3ML LUER SLIP (SYRINGE) IMPLANT
MAT PREVALON FULL STRYKER (MISCELLANEOUS) IMPLANT
NDL HYPO 25X5/8 SAFETYGLIDE (NEEDLE) IMPLANT
NEEDLE HYPO 25X5/8 SAFETYGLIDE (NEEDLE) IMPLANT
NS IRRIG 1000ML POUR BTL (IV SOLUTION) ×1 IMPLANT
PACK C SECTION WH (CUSTOM PROCEDURE TRAY) ×1 IMPLANT
PAD OB MATERNITY 4.3X12.25 (PERSONAL CARE ITEMS) ×1 IMPLANT
RTRCTR C-SECT PINK 25CM LRG (MISCELLANEOUS) IMPLANT
SPONGE LAP 18X18 X RAY DECT (DISPOSABLE) IMPLANT
STRIP CLOSURE SKIN 1/2X4 (GAUZE/BANDAGES/DRESSINGS) IMPLANT
SUT MNCRL AB 4-0 PS2 18 (SUTURE) IMPLANT
SUT MON AB-0 CT1 36 (SUTURE) ×2 IMPLANT
SUT PLAIN 0 NONE (SUTURE) IMPLANT
SUT VIC AB 0 CT1 27XBRD ANBCTR (SUTURE) ×2 IMPLANT
SUT VIC AB 2-0 CT1 TAPERPNT 27 (SUTURE) ×1 IMPLANT
SUT VIC AB 4-0 SH 27XANBCTRL (SUTURE) ×1 IMPLANT
SUTURE PLAIN GUT 2.0 ETHICON (SUTURE) IMPLANT
TOWEL OR 17X24 6PK STRL BLUE (TOWEL DISPOSABLE) ×1 IMPLANT
TRAY FOLEY W/BAG SLVR 14FR LF (SET/KITS/TRAYS/PACK) ×1 IMPLANT
WATER STERILE IRR 1000ML POUR (IV SOLUTION) ×1 IMPLANT

## 2024-03-01 NOTE — Anesthesia Procedure Notes (Signed)
 Epidural Patient location during procedure: OB Start time: 03/01/2024 4:01 AM End time: 03/01/2024 4:04 AM  Staffing Anesthesiologist: Paul Lamarr BRAVO, MD Performed: anesthesiologist   Preanesthetic Checklist Completed: patient identified, IV checked, risks and benefits discussed, monitors and equipment checked, pre-op evaluation and timeout performed  Epidural Patient position: sitting Prep: DuraPrep and site prepped and draped Patient monitoring: continuous pulse ox, blood pressure and heart rate Approach: midline Location: L3-L4 Injection technique: LOR air  Needle:  Needle type: Tuohy  Needle gauge: 17 G Needle length: 9 cm Needle insertion depth: 5 cm Catheter type: closed end flexible Catheter size: 19 Gauge Catheter at skin depth: 10 cm Test dose: negative and Other (1% lidocaine )  Assessment Events: blood not aspirated, no cerebrospinal fluid, injection not painful, no injection resistance, no paresthesia and negative IV test  Additional Notes Patient identified. Risks, benefits, and alternatives discussed with patient including but not limited to bleeding, infection, nerve damage, paralysis, failed block, incomplete pain control, headache, blood pressure changes, nausea, vomiting, reactions to medication, itching, and postpartum back pain. Confirmed with bedside nurse the patient's most recent platelet count. Confirmed with patient that they are not currently taking any anticoagulation, have any bleeding history, or any family history of bleeding disorders. Patient expressed understanding and wished to proceed. All questions were answered. Sterile technique was used throughout the entire procedure. Please see nursing notes for vital signs.   Crisp LOR on first pass. Test dose was given through epidural catheter and negative prior to continuing to dose epidural or start infusion. Warning signs of high block given to the patient including shortness of breath,  tingling/numbness in hands, complete motor block, or any concerning symptoms with instructions to call for help. Patient was given instructions on fall risk and not to get out of bed. All questions and concerns addressed with instructions to call with any issues or inadequate analgesia.  Reason for block:procedure for pain

## 2024-03-01 NOTE — Progress Notes (Signed)
 Labor Progress Note Evelyn Caldwell is a 33 y.o. G3P2002 at [redacted]w[redacted]d  S: Pt has pushed for approximately 4 hours with no significant descent.  Occiput posterior diagnosis.  Dr. Danny tried to rotate the fetus multiple times with ultrasound guidance.  Unfortunately, there was little success.  Pt did try to push again, but fetal skull is at about -2 station with caput slightly lower.  O:  BP 138/87   Pulse (!) 110   Temp 97.9 F (36.6 C) (Oral)   Resp 18   Ht 5' 7 (1.702 m)   Wt 98.1 kg   LMP 06/02/2023   SpO2 100%   BMI 33.86 kg/m  EFM: 160s/moderate variability/occasional late decel and variable decel  CVE: Dilation: 10 Dilation Complete Date: 03/01/24 Dilation Complete Time: 1352 Effacement (%): 100 Cervical Position: Middle Station: -2 Presentation: Vertex Exam by:: Chiquita Riser, RN   A&P: 33 y.o. G3P2002 [redacted]w[redacted]d failure to descend, OP position Pt has pushed for about 4 hours with little change in station.  At this point she desires primary c section with tubal ligation.  Patient and spouse are self pay and understand there will be an additional $1170 charge for the procedure.   The risks of surgery were discussed with the patient including but were not limited to: bleeding which may require transfusion or reoperation; infection which may require antibiotics; injury to bowel, bladder, ureters or other surrounding organs; injury to the fetus; need for additional procedures including hysterectomy in the event of a life-threatening hemorrhage; formation of adhesions; placental abnormalities wth subsequent pregnancies; incisional problems; thromboembolic phenomenon and other postoperative/anesthesia complications.  The patient concurred with the proposed plan, giving informed written consent for the procedure.   Patient has been NPO since admission, she will remain NPO for procedure. Anesthesia and OR aware. Preoperative prophylactic antibiotics and SCDs ordered on call to the OR.  To  OR when ready.    Evelyn DELENA Buddle, MD 9:50 PM

## 2024-03-01 NOTE — Anesthesia Preprocedure Evaluation (Addendum)
 Anesthesia Evaluation  Patient identified by MRN, date of birth, ID band Patient awake    Reviewed: Allergy & Precautions, NPO status , Patient's Chart, lab work & pertinent test results  History of Anesthesia Complications Negative for: history of anesthetic complications  Airway Mallampati: II  TM Distance: >3 FB Neck ROM: Full    Dental no notable dental hx. (+) Teeth Intact, Dental Advisory Given   Pulmonary neg pulmonary ROS   Pulmonary exam normal breath sounds clear to auscultation       Cardiovascular negative cardio ROS Normal cardiovascular exam Rhythm:Regular Rate:Normal     Neuro/Psych  Headaches  Anxiety      negative psych ROS   GI/Hepatic Neg liver ROS, hiatal hernia, PUD,GERD  Medicated,,  Endo/Other  Obesity  Renal/GU Renal diseaseHx/o nephrolithiasis  negative genitourinary   Musculoskeletal negative musculoskeletal ROS (+)    Abdominal  (+) + obese  Peds  Hematology negative hematology ROS (+)   Anesthesia Other Findings   Reproductive/Obstetrics (+) Pregnancy Desires sterilization                              Anesthesia Physical Anesthesia Plan  ASA: 2 and emergent  Anesthesia Plan: Epidural   Post-op Pain Management: Minimal or no pain anticipated and Ofirmev  IV (intra-op)*   Induction: Intravenous  PONV Risk Score and Plan: 4 or greater and Treatment may vary due to age or medical condition and Scopolamine patch - Pre-op  Airway Management Planned: Natural Airway  Additional Equipment: Fetal Monitoring and None  Intra-op Plan:   Post-operative Plan:   Informed Consent: I have reviewed the patients History and Physical, chart, labs and discussed the procedure including the risks, benefits and alternatives for the proposed anesthesia with the patient or authorized representative who has indicated his/her understanding and acceptance.     Dental  advisory given  Plan Discussed with: Anesthesiologist and CRNA  Anesthesia Plan Comments: (Patient for Urgent C/Section and BTL . Will use epidural for C/Section. M. Jerrye,  MD)         Anesthesia Quick Evaluation

## 2024-03-01 NOTE — Progress Notes (Signed)
 LABOR PROGRESS NOTE Pt rechecked at 2020 Patient comfortable with epidural. Pit at 7.  RN working on closed knee pushing.  BSUS shows direct OP Attempted manual rotation, however fetus returned to OP position Urinary Foley catheter was expelled with balloon still inflated Pushing with tug-of-war with slight advancement Catheter re-placed Station 0   FHT: baseline 150, mod variability, +accels, late decels; overall category II. Toco: q2-4 min  A/P:  Continue pushing in various positions Hopeful for vagdel +/- VAVD if continued fetal descent. Currently too high to vacuum   Barabara Maier, DO 8:55 PM

## 2024-03-01 NOTE — Progress Notes (Signed)
 LABOR PROGRESS NOTE Pt rechecked at 1730 Patient comfortable with epidural. Pit at 7. Pushing with RN.   Difficult to palpate fontanelles, possibly LOP presentation.  FHT: baseline 140, mod variability, +accels, intermittent late decels with pushing; overall category II. Toco: q3 min  A/P:  Continue pushing Anticipate NVD  Barabara Maier, DO 5:45 PM

## 2024-03-01 NOTE — Progress Notes (Signed)
 Patient ID: Evelyn Caldwell, female   DOB: Aug 01, 1990, 33 y.o.   MRN: [id number]  Comfortable w epidural  VSS, afebrile FHR 120s, +accels, no decels, Cat 1 Ctx q 3-5 mins with Pit at 43mu/min Cx 4/60/vtx -2; AROM for copious clear fluid  IUP@39 .0wks IOL process Nephrolithiasis with worsening pain  Hopeful with AROM & Pit that she will progress to active labor in the near future Anticipate vag delivery Has urology plan for postpartum  Evelyn Caldwell 03/01/2024 7:49 AM

## 2024-03-01 NOTE — Op Note (Incomplete)
 Evelyn Caldwell PROCEDURE DATE: 03/01/2024  PREOPERATIVE DIAGNOSES: Intrauterine pregnancy at [redacted]w[redacted]d weeks gestation; failure to progress: arrest of descent and persistent occiput posterior position.  Pt pushed x 4 hours with minimal descent.  Rotation of fetal head was ineffective.  POSTOPERATIVE DIAGNOSES: The same  PROCEDURE: Primary Low Transverse Cesarean Section, bilateral salpingectomy  SURGEON:  Dr. Jerilynn Buddle  ASSISTANT:  Leeroy Pouch, MD  ANESTHESIOLOGY TEAM: Anesthesiologist: Jerrye Sharper, MD; Jefm Garnette LABOR, MD; Paul Lamarr BRAVO, MD CRNA: Mylo Asberry BIRCH, CRNA  INDICATIONS: Evelyn Caldwell is a 33 y.o. [phone] at [redacted]w[redacted]d here for cesarean section secondary to the indications listed under preoperative diagnoses; please see preoperative note for further details.  The risks of surgery were discussed with the patient including but were not limited to: bleeding which may require transfusion or reoperation; infection which may require antibiotics; injury to bowel, bladder, ureters or other surrounding organs; injury to the fetus; need for additional procedures including hysterectomy in the event of a life-threatening hemorrhage; formation of adhesions; placental abnormalities wth subsequent pregnancies; incisional problems; thromboembolic phenomenon and other postoperative/anesthesia complications.  The patient concurred with the proposed plan, giving informed written consent for the procedure.    FINDINGS:  Viable female infant in cephalic presentation.  Apgars *** and ***.  Clear amniotic fluid.  Intact placenta, three vessel cord.  Normal uterus, fallopian tubes and ovaries bilaterally.  ANESTHESIA: Spinal***Epidural  INTRAVENOUS FLUIDS: *** ml   ESTIMATED BLOOD LOSS: *** ml URINE OUTPUT:  *** ml SPECIMENS: Placenta sent to L&D***pathology COMPLICATIONS: None immediate  PROCEDURE IN DETAIL:  The patient preoperatively received intravenous antibiotics and had  sequential compression devices applied to her lower extremities.  She was then taken to the operating room where spinal anesthesia was administered in sterile fashion***the epidural anesthesia was dosed up to surgical level*** and was found to be adequate. She was then placed in a dorsal supine position with a leftward tilt, and prepped and draped in a sterile manner.  A foley catheter was placed into her bladder and attached to constant gravity.  After an adequate timeout was performed, a Pfannenstiel skin incision was made with scalpel two fingerbreaths above the pubic symphasis***on her preexisting scar***and carried through to the underlying layer of fascia. The fascia was incised in the midline, and this incision was extended bilaterally using the Mayo scissors.  Kocher clamps x 2 were applied to the superior aspect of the fascial incision and the underlying rectus muscles were dissected off bluntly and sharply.  A similar process was carried out on the inferior aspect of the fascial incision. The rectus muscles were separated in the midline and the peritoneum was entered bluntly. The Alexis self-retaining retractor was introduced into the abdominal cavity.  The vesicouterine peritoneum was identified and grasped using smooth pickups.  It was incised and extended laterally using the Metzenbaum scissors.  A bladder flap was then digitally created.  Attention was turned to the lower uterine segment where a low transverse hysterotomy was made with a scalpel and extended bilaterally bluntly.  The infant was successfully delivered, the cord was clamped and cut after one minute, and the infant was handed over to the awaiting neonatology team. Uterine massage was then administered, and the placenta delivered intact with a three-vessel cord. The uterus was then cleared of clots and debris using manual curettage.  The uterine incision was closed with 1-0 chromic in a running locked fashion, and an imbricating layer was  also placed with 1-0 chromic.  ***Figure-of-eight  0 Vicryl serosal stitches were placed to help with hemostasis.  The pelvis was cleared of all clot and debris with irrigation and suction. Hemostasis was confirmed on all surfaces.  The retractor was removed.  The peritoneum was closed with a 2-0 Vicryl running stitch ***and the rectus muscles were reapproximated using 0 Vicryl interrupted stitches. The fascia was then closed using 0 Vicryl***PDS in a running fashion.  The subcutaneous layer was irrigated, ***reapproximated with 2-0 plain gut interrupted stitches***, and the skin was closed with a 4-0 Vicryl subcuticular stitch. The patient tolerated the procedure well. Sponge, instrument and needle counts were correct x 3.  She was taken to the recovery room in stable condition.    Jerilynn Buddle, MD, FACOG Obstetrician & Gynecologist, [facility] for [facility], [facility]

## 2024-03-01 NOTE — Op Note (Signed)
 [employer] PROCEDURE DATE: 03/01/2024  PREOPERATIVE DIAGNOSES: Intrauterine pregnancy at [redacted]w[redacted]d weeks gestation; failure to progress: arrest of descent and persistent occiput posterior position.  Pt pushed x 4 hours with minimal descent.  Rotation of fetal head was ineffective.  POSTOPERATIVE DIAGNOSES: The same  PROCEDURE: Primary Low Transverse Cesarean Section, bilateral salpingectomy  SURGEON:  Dr. Jerilynn Buddle  ASSISTANT:  Leeroy Pouch, MD, Dr. Evangeline  ANESTHESIOLOGY TEAM: Anesthesiologist: Jerrye Sharper, MD; Jefm Garnette LABOR, MD; Paul Lamarr BRAVO, MD CRNA: Mylo Asberry BIRCH, CRNA  INDICATIONS: Evelyn Caldwell is a 33 y.o. [phone] at [redacted]w[redacted]d here for cesarean section secondary to the indications listed under preoperative diagnoses; please see preoperative note for further details.  The risks of surgery were discussed with the patient including but were not limited to: bleeding which may require transfusion or reoperation; infection which may require antibiotics; injury to bowel, bladder, ureters or other surrounding organs; injury to the fetus; need for additional procedures including hysterectomy in the event of a life-threatening hemorrhage; formation of adhesions; placental abnormalities wth subsequent pregnancies; incisional problems; thromboembolic phenomenon and other postoperative/anesthesia complications.  The patient concurred with the proposed plan, giving informed written consent for the procedure.    FINDINGS:  Viable female infant in cephalic presentation.  Apgars 8 and 9.  Clear amniotic fluid.  Intact placenta, three vessel cord.  Normal uterus, fallopian tubes and ovaries bilaterally.  ANESTHESIA: Epidural  INTRAVENOUS FLUIDS: 2500 ml   ESTIMATED BLOOD LOSS: 1372 ml URINE OUTPUT:  250 ml Albumin 250 ml SPECIMENS: Placenta sent to L&D COMPLICATIONS: Cervical extensions  PROCEDURE IN DETAIL:  The patient preoperatively received intravenous  antibiotics and had sequential compression devices applied to her lower extremities.  She was then taken to the operating room where the epidural anesthesia was dosed up to surgical level and was found to be adequate. She was then placed in a dorsal supine position with a leftward tilt, and prepped and draped in a sterile manner.  A foley catheter was placed into her bladder and attached to constant gravity.  After an adequate timeout was performed, a Pfannenstiel skin incision was made with scalpel two fingerbreaths above the pubic symphysis and carried through to the underlying layer of fascia. The fascia was incised in the midline, and this incision was extended bilaterally using the Mayo scissors.  Kocher clamps x 2 were applied to the superior aspect of the fascial incision and the underlying rectus muscles were dissected off bluntly and sharply.  A similar process was carried out on the inferior aspect of the fascial incision. The rectus muscles were separated in the midline and the peritoneum was entered bluntly. The Alexis self-retaining retractor was introduced into the abdominal cavity.  The vesicouterine peritoneum was identified and grasped using smooth pickups.  It was incised and extended laterally using the Metzenbaum scissors.  A bladder flap was then digitally created.  Attention was turned to the lower uterine segment where a low transverse hysterotomy was made with a scalpel and extended bilaterally bluntly. Dr. Pouch attempted delivery of the fetal head and was unsuccessful.  I was able to deliver the head and infant, but there immediately appeared to be deep cervical extensions since the infant was so low. The infant was successfully delivered, the cord was clamped and cut after one minute, and the infant was handed over to the awaiting neonatology team. Uterine massage was then administered, and the placenta delivered intact with a three-vessel cord. The uterus was then cleared of clots  and  debris using manual curettage.  The uterine incision was closed with 0 monocryl in a running locked fashion,We began repairing the cervical extensions and Dr. Evangeline scrubbed in at this time to aid with suction and visualization of the hysterotomy. The pelvis was cleared of all clot and debris with irrigation and suction.   Attention was then turned to the left fallopian tube. A hand held ligasure device was placed on the mesosalpinx underneath most of the tube.  The ligasure device was moved down the length of the tube, cauterizing the mesosalpinx until it was fully excised.   The right fallopian tube was then identified and was excised in a similar fashion allowing for bilateral tubal sterilization via bilateral salpingectomy.  Good hemostasis was noted overall. Hemostasis was confirmed on all surfaces.  The retractor was removed.  The peritoneum was closed with a 2-0 Vicryl running stitch. The fascia was then closed using 0 Vicryl in a running fashion.  The subcutaneous layer was irrigated, reapproximated with 2-0 plain gut interrupted stitches, and the skin was closed with a 4-0 Monocryl subcuticular stitch. The patient tolerated the procedure well. Sponge, instrument and needle counts were correct x 3.  She was taken to the recovery room in stable condition.    Jerilynn Buddle, MD, FACOG Obstetrician & Gynecologist, [facility] for [facility], [facility]

## 2024-03-01 NOTE — Progress Notes (Signed)
 Evelyn Caldwell is a 33 y.o. G3P2002 at [redacted]w[redacted]d.  Subjective: Not feeling presure w/ UC's. Pushing x 45 minutes  Objective: BP 106/84   Pulse (!) 104   Temp 97.9 F (36.6 C) (Oral)   Resp 18   Ht 5' 7 (1.702 m)   Wt 98.1 kg   LMP 06/02/2023   SpO2 100%   BMI 33.86 kg/m    FHT:  FHR: 140 bpm, variability: mod,  accelerations:  15x15,  decelerations:  repetitive variables, lates UC:   Q 2-4 minutes, strong Dilation: 10 Dilation Complete Date: 03/01/24 Dilation Complete Time: 1352 Effacement (%): 100 Cervical Position: Middle Station: -2 Presentation: Vertex Exam by:: Chiquita Riser, RN  Labs: Results for orders placed or performed during the hospital encounter of 03/01/24 (from the past 24 hours)  CBC     Status: Abnormal   Collection Time: 03/01/24 12:22 AM  Result Value Ref Range   WBC 7.4 4.0 - 10.5 K/uL   RBC 3.39 (L) 3.87 - 5.11 MIL/uL   Hemoglobin 9.2 (L) 12.0 - 15.0 g/dL   HCT 72.4 (L) 63.9 - 53.9 %   MCV 81.1 80.0 - 100.0 fL   MCH 27.1 26.0 - 34.0 pg   MCHC 33.5 30.0 - 36.0 g/dL   RDW 85.4 88.4 - 84.4 %   Platelets 259 150 - 400 K/uL   nRBC 0.0 0.0 - 0.2 %  RPR     Status: None   Collection Time: 03/01/24 12:22 AM  Result Value Ref Range   RPR Ser Ql NON REACTIVE NON REACTIVE  Type and screen     Status: None   Collection Time: 03/01/24 12:25 AM  Result Value Ref Range   ABO/RH(D) A POS    Antibody Screen NEG    Sample Expiration      03/04/2024,2359 Performed at [facility], [address] [place], [place] [address]     Assessment / Plan: [redacted]w[redacted]d week IUP ROM x Hours: 7 Minutes: 4 Labor: Second stage. Baby still high. Not much decent with pushing.  Fetal Wellbeing:  Category I- II, recent development of variable and late decels with pushing. Will stop pushing and see if baby recovers. Will D/C pitocin  if tracing does not improve quickly. Attending aware.   Pain Control:  Epid Anticipated MOD:  SVD Hgb 9.2: Plan TXA  Harrington Jobe,  Tecia Cinnamon , CNM 03/01/2024 2:48 PM

## 2024-03-02 ENCOUNTER — Encounter (HOSPITAL_COMMUNITY): Payer: Self-pay | Admitting: Obstetrics and Gynecology

## 2024-03-02 LAB — CBC
HCT: 19.6 % — ABNORMAL LOW (ref 36.0–46.0)
Hemoglobin: 6.3 g/dL — CL (ref 12.0–15.0)
MCH: 26.7 pg (ref 26.0–34.0)
MCHC: 32.1 g/dL (ref 30.0–36.0)
MCV: 83.1 fL (ref 80.0–100.0)
Platelets: 203 K/uL (ref 150–400)
RBC: 2.36 MIL/uL — ABNORMAL LOW (ref 3.87–5.11)
RDW: 14.8 % (ref 11.5–15.5)
WBC: 9.8 K/uL (ref 4.0–10.5)
nRBC: 0.2 % (ref 0.0–0.2)

## 2024-03-02 LAB — BIRTH TISSUE RECOVERY COLLECTION (PLACENTA DONATION)

## 2024-03-02 LAB — PREPARE RBC (CROSSMATCH)

## 2024-03-02 LAB — CREATININE, SERUM
Creatinine, Ser: 0.67 mg/dL (ref 0.44–1.00)
GFR, Estimated: >60 mL/min (ref >60)

## 2024-03-02 MED ORDER — FENTANYL CITRATE (PF) 100 MCG/2ML IJ SOLN: 25.0000 ug | INTRAMUSCULAR | Status: DC | PRN

## 2024-03-02 MED ORDER — NALBUPHINE HCL 10 MG/ML IJ SOLN
5.0000 mg | Freq: Once | INTRAMUSCULAR | Status: AC
  Administered 2024-03-02: 5 mg via INTRAVENOUS
  Filled 2024-03-02: qty 1

## 2024-03-02 MED ORDER — OXYCODONE HCL 5 MG PO TABS: 5.0000 mg | ORAL_TABLET | ORAL | Status: DC | PRN

## 2024-03-02 MED ORDER — ACETAMINOPHEN 500 MG PO TABS
1000.0000 mg | ORAL_TABLET | Freq: Four times a day (QID) | ORAL | Status: DC | PRN
  Administered 2024-03-02 – 2024-03-03 (×3): 1000 mg via ORAL
  Filled 2024-03-02 (×4): qty 2

## 2024-03-02 MED ORDER — SENNOSIDES-DOCUSATE SODIUM 8.6-50 MG PO TABS
2.0000 | ORAL_TABLET | Freq: Every day | ORAL | Status: DC
  Administered 2024-03-03: 2 via ORAL
  Filled 2024-03-02: qty 2

## 2024-03-02 MED ORDER — SIMETHICONE 80 MG PO CHEW
80.0000 mg | CHEWABLE_TABLET | Freq: Three times a day (TID) | ORAL | Status: DC
  Administered 2024-03-02 – 2024-03-03 (×2): 80 mg via ORAL
  Filled 2024-03-02 (×3): qty 1

## 2024-03-02 MED ORDER — GABAPENTIN 100 MG PO CAPS
200.0000 mg | ORAL_CAPSULE | Freq: Two times a day (BID) | ORAL | Status: DC
  Administered 2024-03-02 – 2024-03-03 (×4): 200 mg via ORAL
  Filled 2024-03-02 (×4): qty 2

## 2024-03-02 MED ORDER — ZOLPIDEM TARTRATE 5 MG PO TABS: 5.0000 mg | ORAL_TABLET | Freq: Every evening | ORAL | Status: DC | PRN

## 2024-03-02 MED ORDER — PRENATAL MULTIVITAMIN CH
1.0000 | ORAL_TABLET | Freq: Every day | ORAL | Status: DC
  Administered 2024-03-03: 1 via ORAL
  Filled 2024-03-02 (×2): qty 1

## 2024-03-02 MED ORDER — OXYTOCIN-SODIUM CHLORIDE 30-0.9 UT/500ML-% IV SOLN
2.5000 [IU]/h | INTRAVENOUS | Status: AC
  Filled 2024-03-02: qty 500

## 2024-03-02 MED ORDER — COCONUT OIL OIL: 1.0000 | TOPICAL_OIL | Status: DC | PRN

## 2024-03-02 MED ORDER — MENTHOL 3 MG MT LOZG: 1.0000 | LOZENGE | OROMUCOSAL | Status: DC | PRN

## 2024-03-02 MED ORDER — ONDANSETRON HCL 4 MG/2ML IJ SOLN
4.0000 mg | Freq: Three times a day (TID) | INTRAMUSCULAR | Status: DC | PRN
  Administered 2024-03-02: 4 mg via INTRAVENOUS
  Filled 2024-03-02: qty 2

## 2024-03-02 MED ORDER — MEPERIDINE HCL 25 MG/ML IJ SOLN: 6.2500 mg | INTRAMUSCULAR | Status: DC | PRN

## 2024-03-02 MED ORDER — KETOROLAC TROMETHAMINE 30 MG/ML IJ SOLN
30.0000 mg | Freq: Four times a day (QID) | INTRAMUSCULAR | Status: AC | PRN
  Administered 2024-03-02: 30 mg via INTRAVENOUS

## 2024-03-02 MED ORDER — SODIUM CHLORIDE 0.9% IV SOLUTION: Freq: Once | INTRAVENOUS | Status: AC

## 2024-03-02 MED ORDER — NALOXONE HCL 0.4 MG/ML IJ SOLN: 0.4000 mg | INTRAMUSCULAR | Status: DC | PRN

## 2024-03-02 MED ORDER — WITCH HAZEL-GLYCERIN EX PADS: 1.0000 | MEDICATED_PAD | CUTANEOUS | Status: DC | PRN

## 2024-03-02 MED ORDER — IBUPROFEN 600 MG PO TABS
600.0000 mg | ORAL_TABLET | Freq: Four times a day (QID) | ORAL | Status: DC
  Administered 2024-03-02 – 2024-03-03 (×5): 600 mg via ORAL
  Filled 2024-03-02 (×6): qty 1

## 2024-03-02 MED ORDER — SODIUM CHLORIDE 0.9% FLUSH: 3.0000 mL | INTRAVENOUS | Status: DC | PRN

## 2024-03-02 MED ORDER — ENOXAPARIN SODIUM 60 MG/0.6ML IJ SOSY
50.0000 mg | PREFILLED_SYRINGE | INTRAMUSCULAR | Status: DC
  Administered 2024-03-02: 50 mg via SUBCUTANEOUS
  Filled 2024-03-02: qty 0.6

## 2024-03-02 MED ORDER — NALOXONE HCL 4 MG/10ML IJ SOLN: 1.0000 ug/kg/h | INTRAVENOUS | Status: DC | PRN

## 2024-03-02 MED ORDER — KETOROLAC TROMETHAMINE 30 MG/ML IJ SOLN: 30.0000 mg | Freq: Four times a day (QID) | INTRAMUSCULAR | Status: AC | PRN

## 2024-03-02 MED ORDER — DIPHENHYDRAMINE HCL 50 MG/ML IJ SOLN: 12.5000 mg | INTRAMUSCULAR | Status: DC | PRN

## 2024-03-02 MED ORDER — SIMETHICONE 80 MG PO CHEW: 80.0000 mg | CHEWABLE_TABLET | ORAL | Status: DC | PRN

## 2024-03-02 MED ORDER — DIPHENHYDRAMINE HCL 25 MG PO CAPS: 25.0000 mg | ORAL_CAPSULE | ORAL | Status: DC | PRN

## 2024-03-02 MED ORDER — KETOROLAC TROMETHAMINE 30 MG/ML IJ SOLN
INTRAMUSCULAR | Status: AC
  Filled 2024-03-02: qty 1

## 2024-03-02 MED ORDER — DIPHENHYDRAMINE HCL 25 MG PO CAPS
25.0000 mg | ORAL_CAPSULE | Freq: Four times a day (QID) | ORAL | Status: DC | PRN
  Filled 2024-03-02: qty 1

## 2024-03-02 MED ORDER — DIBUCAINE (PERIANAL) 1 % EX OINT: 1.0000 | TOPICAL_OINTMENT | CUTANEOUS | Status: DC | PRN

## 2024-03-02 NOTE — Progress Notes (Addendum)
 POSTPARTUM PROGRESS NOTE  Post Partum Day 1  Subjective:  Brookelynn Hamor is a 33 y.o. [phone] s/p 1*LTCS at [redacted]w[redacted]d.  She reports she is doing well. No acute events overnight. She denies any problems with ambulating, voiding or po intake. She has had a couple episodes of nausea and vomiting, but was receiving zofran  at the bedside on rounds.  Pain is well controlled.  Lochia is Normal.  Objective: Blood pressure 118/76, pulse 89, temperature 98.7 F (37.1 C), temperature source Oral, resp. rate 18, height 5' 7 (1.702 m), weight 98.1 kg, last menstrual period 06/02/2023, SpO2 99%, unknown if currently breastfeeding.  BP Readings from Last 3 Encounters:  03/02/24 118/76  02/23/24 115/76  02/16/24 115/77    Physical Exam:  General: alert, cooperative and no distress Chest: no respiratory distress Heart:regular rate, distal pulses intact Uterine Fundus: firm, appropriately tender DVT Evaluation: No calf swelling or tenderness Extremities: no edema Skin: warm, dry  Recent Labs    03/01/24 0022 03/02/24 0540  HGB 9.2* 6.3*  HCT 27.5* 19.6*    Assessment/Plan: Mykala Mccready is a 33 y.o. [phone] s/p PCS at [redacted]w[redacted]d   PPD# 1 - Doing well  Routine postpartum care  Delivery Complications: arrest of descent necessitating C/S Blood Pressure: normal Anemia/Hb Status: Stable, appropriate postpartum Hb, no intervention indicated and Very Low, blood transfusion indicated , consented Contraception: Interval BTL Feeding: breast feeding Needs Lactation Consultation: Yes    Dispo: Plan for discharge in the next 2 days.   LOS: 1 day   Charlie DELENA Courts, MD OB Fellow  03/02/2024, 11:45 AM

## 2024-03-02 NOTE — Progress Notes (Signed)
 Called Vernell Ruddle, CNM requesting an order for scheduled tylenol  to go along with her ibuprofen . Also notified of slightly decreased urine output and a small, stringy clot noted in patient's foley when emptying it. Patient's blood transfusion has now started and pitocin  stopped for transfusion. To notify MD if urine output decreases more. Tammy, Steva [employer]

## 2024-03-02 NOTE — Lactation Note (Signed)
 This note was copied from a baby's chart. Lactation Consultation Note  Patient Name: Evelyn Caldwell Unijb'd Date: 03/02/2024 Age:34 hours Reason for consult: Follow-up assessment;Term  P3- [name] reports that infant has been latching really well so far. [name] reports feeling shocked with how well he latches without a nipple shield because her two older children needed them. [name] reports that her nipples are feeling tender and would like coconut oil. LC provided this to her. [name] denies having any questions or concerns at this time. LC encouraged [name] to call for further assistance as needed.  Maternal Data Has patient been taught Hand Expression?: Yes Does the patient have breastfeeding experience prior to this delivery?: Yes  Feeding Mother's Current Feeding Choice: Breast Milk  Interventions Interventions: Breast feeding basics reviewed;Education;Coconut oil;LC Services brochure  Discharge Discharge Education: Engorgement and breast care;Warning signs for feeding baby Pump: DEBP;Personal  Consult Status Consult Status: Follow-up Date: 03/03/24 Follow-up type: In-patient    Recardo Hoit BS, IBCLC 03/02/2024, 7:18 PM

## 2024-03-02 NOTE — Progress Notes (Signed)
 [name] was referred for history of depression/anxiety.  * Referral screened out by Clinical Social Worker because none of the following criteria appear to apply:  ~ History of anxiety/depression during this pregnancy, or of post-partum depression following prior delivery.  ~ Diagnosis of anxiety and/or depression within last 3 years Per OB notes, [name] did not indicate any signs/symptoms during her pregnancy.  Anxiety and Depression 2017  OR  * [name]'s symptoms currently being treated with medication and/or therapy.  Please contact the Clinical Social Worker if needs arise, by [facility] request, or if [name] scores greater than 9/yes to question 10 on Edinburgh Postpartum Depression Screen.  Evelyn Caldwell, [place] Clinical Social Worker [phone]

## 2024-03-02 NOTE — Lactation Note (Signed)
 This note was copied from a baby's chart. Lactation Consultation Note  Patient Name: Evelyn Caldwell Date: 03/02/2024 Age:34 hours Reason for consult: Initial assessment;Term  P3. Experienced BF mom stated this baby has BF well this is 2nd time. Latches well w/no pain. Mom stated for some reason mom needed a NS w/her other 2 children but this time her nipples appear to be everted and the baby appears to be latching well. Praised mom. Asked mom if her other 2 children had tongue ties, mom stated she doesn't think so. Mom encouraged to feed baby 8-12 times/24 hours and with feeding cues.  Newborn feeding habits, STS, I&O, body alignment reviewed. If baby hasn't cued to feed in 3 hrs wake baby to feed. Baby appears to be transferring colostrum during feeding. Has good breast compressions w/suckling. Encouraged mom to call for assistance as needed. Maternal Data Does the patient have breastfeeding experience prior to this delivery?: Yes How long did the patient breastfeed?: 3 months w/1st child, now 28 yrs old, and 6-8 months w/2nd child, now 21 yrs old.  Feeding    LATCH Score Latch: Grasps breast easily, tongue down, lips flanged, rhythmical sucking.  Audible Swallowing: A few with stimulation  Type of Nipple: Everted at rest and after stimulation  Comfort (Breast/Nipple): Soft / non-tender  Hold (Positioning): No assistance needed to correctly position infant at breast.  LATCH Score: 9   Lactation Tools Discussed/Used    Interventions Interventions: Breast feeding basics reviewed;Skin to skin;Education;LPT handout/interventions  Discharge Pump: DEBP  Consult Status Consult Status: Follow-up Date: 03/03/24 Follow-up type: In-patient    Allianna Beaubien G 03/02/2024, 4:31 AM

## 2024-03-02 NOTE — Progress Notes (Addendum)
 CRITICAL VALUE STICKER  CRITICAL VALUE: Hgb 6.3  RECEIVER (on-site recipient of call): Kaysen Sefcik, RN  DATE & TIME NOTIFIED: 03/02/2024 @ 0627  MESSENGER (representative from lab): Glinda  MD NOTIFIED:  Cathlean Rover- Dishmon, CNM  TIME OF NOTIFICATION: 0627  RESPONSE:  No new orders

## 2024-03-02 NOTE — Transfer of Care (Signed)
 Immediate Anesthesia Transfer of Care Note  Patient: Evelyn Caldwell  Procedure(s) Performed: CESAREAN SECTION, WITH BILATERAL TUBAL LIGATION  Patient Location: PACU  Anesthesia Type:Epidural  Level of Consciousness: awake, alert , and oriented  Airway & Oxygen Therapy: Patient Spontanous Breathing  Post-op Assessment: Report given to RN and Post -op Vital signs reviewed and stable  Post vital signs: Reviewed and stable  Last Vitals:  Vitals Value Taken Time  BP 128/84 03/02/24 00:05  Temp 36.9 C 03/02/24 00:05  Pulse 113 03/02/24 00:13  Resp 16 03/02/24 00:13  SpO2 98 % 03/02/24 00:13  Vitals shown include unfiled device data.  Last Pain:  Vitals:   03/02/24 0005  TempSrc: Oral  PainSc:          Complications: No notable events documented.

## 2024-03-02 NOTE — Anesthesia Postprocedure Evaluation (Signed)
 Anesthesia Post Note  Patient: Evelyn Caldwell  Procedure(s) Performed: CESAREAN SECTION, WITH BILATERAL TUBAL LIGATION     Patient location during evaluation: PACU Anesthesia Type: Epidural Level of consciousness: oriented and awake and alert Pain management: pain level controlled Vital Signs Assessment: post-procedure vital signs reviewed and stable Respiratory status: spontaneous breathing, respiratory function stable and nonlabored ventilation Cardiovascular status: blood pressure returned to baseline and stable Postop Assessment: no headache, no backache, no apparent nausea or vomiting, epidural receding and patient able to bend at knees Anesthetic complications: no   No notable events documented.  Last Vitals:  Vitals:   03/02/24 0050 03/02/24 0100  BP:  133/71  Pulse: 94 (!) 114  Resp: 17 19  Temp:    SpO2: 97% 97%    Last Pain:  Vitals:   03/02/24 0105  TempSrc:   PainSc: 2    Pain Goal:                Epidural/Spinal Function Cutaneous sensation: Normal sensation (03/02/24 0105), Patient able to flex knees: Yes (03/02/24 0105), Patient able to lift hips off bed: No (03/02/24 0105), Back pain beyond tenderness at insertion site: No (03/02/24 0105), Progressively worsening motor and/or sensory loss: No (03/02/24 0105), Bowel and/or bladder incontinence post epidural: No (03/02/24 0105)  Jaley Yan A.

## 2024-03-03 DIAGNOSIS — Z98891 History of uterine scar from previous surgery: Secondary | ICD-10-CM

## 2024-03-03 DIAGNOSIS — D62 Acute posthemorrhagic anemia: Secondary | ICD-10-CM

## 2024-03-03 HISTORY — DX: Acute posthemorrhagic anemia: D62

## 2024-03-03 LAB — BPAM RBC
Blood Product Expiration Date: 202510252359
Blood Product Expiration Date: 202510252359
ISSUE DATE / TIME: 202510031115
ISSUE DATE / TIME: 202510031318
Unit Type and Rh: 6200
Unit Type and Rh: 6200

## 2024-03-03 LAB — TYPE AND SCREEN
ABO/RH(D): A POS
Antibody Screen: NEGATIVE
Unit division: 0
Unit division: 0

## 2024-03-03 LAB — CBC
HCT: 21.2 % — ABNORMAL LOW (ref 36.0–46.0)
Hemoglobin: 7.2 g/dL — ABNORMAL LOW (ref 12.0–15.0)
MCH: 28 pg (ref 26.0–34.0)
MCHC: 34 g/dL (ref 30.0–36.0)
MCV: 82.5 fL (ref 80.0–100.0)
Platelets: 198 K/uL (ref 150–400)
RBC: 2.57 MIL/uL — ABNORMAL LOW (ref 3.87–5.11)
RDW: 14.7 % (ref 11.5–15.5)
WBC: 8 K/uL (ref 4.0–10.5)
nRBC: 0 % (ref 0.0–0.2)

## 2024-03-03 MED ORDER — FAMOTIDINE 20 MG PO TABS
20.0000 mg | ORAL_TABLET | Freq: Two times a day (BID) | ORAL | Status: DC
  Administered 2024-03-03: 20 mg via ORAL
  Filled 2024-03-03: qty 1

## 2024-03-03 MED ORDER — IBUPROFEN 600 MG PO TABS: 600.0000 mg | ORAL_TABLET | Freq: Four times a day (QID) | ORAL | 1 refills | Status: DC

## 2024-03-03 MED ORDER — OXYCODONE HCL 5 MG PO TABS: 5.0000 mg | ORAL_TABLET | ORAL | 0 refills | Status: DC | PRN

## 2024-03-03 NOTE — Progress Notes (Signed)
The patient is receiving Pepcid by the intravenous route.  Based on criteria approved by the Pharmacy and Therapeutics Committee and the Medical Executive Committee, the medication is being converted to the equivalent oral dose form.  These criteria include: -No Active GI bleeding -Able to tolerate diet of full liquids (or better) or tube feeding -Able to tolerate other medications by the oral or enteral route  If you have any questions about this conversion, please contact the Pharmacy Department (ext [phone]).  Thank you.

## 2024-03-03 NOTE — Progress Notes (Addendum)
 Post Partum Day 2  Subjective: No acute events overnight. no complaints, up ad lib, voiding, tolerating PO, and + flatus. Reports she has an appetite and hasn't had difficulty walking or going to the bathroom. Reports little lochia. Breastfeeding and bottle feeding, baby doing well.  Objective: Blood pressure (!) 115/56, pulse 69, temperature 98 F (36.7 C), temperature source Oral, resp. rate 17, height 5' 7 (1.702 m), weight 98.1 kg, last menstrual period 06/02/2023, SpO2 100%, unknown if currently breastfeeding.  Physical Exam:  General: alert, cooperative, and no distress Lochia: appropriate Uterine Fundus: firm Incision: healing well, no significant drainage, no dehiscence, tender to palpation DVT Evaluation: No evidence of DVT seen on physical exam. Negative Homan's sign. No cords or calf tenderness. No significant calf/ankle edema.  Recent Labs    03/02/24 0540 03/03/24 0543  HGB 6.3* 7.2*  HCT 19.6* 21.2*    Assessment/Plan: #POD2: Doing well. Plan for discharge tomorrow.  #Anemia: S/P two units pRBC. AM labs trending upward, 7.2 this AM from 6.3 yesterday and patient feeling well. Considering IV iron  infusion. Continue PO iron  and follow-up out patient.    LOS: 2 days   Terrace Compton, MD 03/03/2024, 7:42 AM    Attestation of Attending Supervision of Resident Physician (MD/DO): Evaluation, management, and procedures were performed by the Resident Physician under my supervision and collaboration.  I saw and evaluated this patient independently of the resident and agree with the findings. I have reviewed the Resident Physician's note and chart, and I agree with the management and plan.  Feeling very well, had reaction to IV iron  last time and would prefer not to get it. Will send home with PO iron , she is agreeable to this plan. Stable H/H. S/p BTL, no acute issues.    LOIS Yolanda Moats, MD, [facility] Attending Center for [employer] (Faculty  Practice)  03/03/2024 10:56 AM

## 2024-03-03 NOTE — Discharge Summary (Signed)
 Postpartum Discharge Summary     Patient Name: Evelyn Caldwell DOB: 1991-01-06 MRN: [id number]  Date of admission: 03/01/2024 Delivery date:03/01/2024 Delivering provider: ZINA SATTERFIELD A Date of discharge: 03/03/2024  Admitting diagnosis: Pregnancy complicated by nephrolithiasis in third trimester, antepartum [O99.891, N20.0] Intrauterine pregnancy: [redacted]w[redacted]d     Secondary diagnosis:  Active Problems:   Nephrolithiasis   Supervision of normal pregnancy   Obesity affecting pregnancy, antepartum   Consultation for female sterilization   Pregnancy complicated by nephrolithiasis in third trimester, antepartum   S/P C-section   Acute blood loss anemia  Additional problems: n/a    Discharge diagnosis: Term Pregnancy Delivered                                              Post partum procedures:blood transfusion Augmentation: AROM and Pitocin  Complications: Hemorrhage>1035mL  Hospital course: Induction of Labor With Cesarean Section   33 y.o. yo G3P3003 at [redacted]w[redacted]d was admitted to the hospital 03/01/2024 for induction of labor. Patient had a labor course significant for progression to 10 cm dilated with no descent of fetal head despite 4 hours active pushing, noted to be OP. The patient went for cesarean section due to Arrest of Descent. Delivery details are as follows: Membrane Rupture Time/Date: 7:44 AM,03/01/2024  Delivery Method:C-Section, Low Transverse Operative Delivery:N/A Details of operation can be found in separate operative Note.  Patient had a postpartum course complicated by acute blood loss anemia, received 2 units pRBCS and feeling very well. Post transfusion H/H 7.221.2. Will start on PO iron . She is ambulating, tolerating a regular diet, passing flatus, and urinating well. Will start  Patient is discharged home in stable condition on 03/03/24.      Newborn Data: Birth date:03/01/2024 Birth time:10:48 PM Gender:Female Living status:Living Apgars:8 ,9  Weight:3520 g                                Magnesium  Sulfate received: No BMZ received: No Rhophylac:N/A MMR:N/A T-DaP:Given prenatally Flu: Yes RSV Vaccine received: No Transfusion:Yes  Immunizations received: Immunization History  Administered Date(s) Administered   Influenza, Seasonal, Injecte, Preservative Fre 02/09/2024   Influenza,inj,Quad PF,6+ Mos 04/01/2015, 03/11/2016, 07/31/2018, 06/29/2019   MMR 03/11/2016   Tdap 12/16/2015, 08/08/2018, 12/13/2023   Varicella 03/11/2016    Physical exam  Vitals:   03/02/24 1339 03/02/24 1610 03/02/24 2005 03/03/24 0449  BP: 114/79 118/68 121/68 (!) 115/56  Pulse: 98 78 85 69  Resp: 18 18 18 17   Temp: 98.2 F (36.8 C) 97.6 F (36.4 C) 98.3 F (36.8 C) 98 F (36.7 C)  TempSrc: Oral Oral Oral Oral  SpO2: 100% 100% 100% 100%  Weight:      Height:       General: alert, cooperative, and no distress Lochia: appropriate Uterine Fundus: firm Incision: Healing well with no significant drainage, Dressing is clean, dry, and intact DVT Evaluation: No evidence of DVT seen on physical exam. Labs: Lab Results  Component Value Date   WBC 8.0 03/03/2024   HGB 7.2 (L) 03/03/2024   HCT 21.2 (L) 03/03/2024   MCV 82.5 03/03/2024   PLT 198 03/03/2024      Latest Ref Rng & Units 03/02/2024    5:40 AM  CMP  Creatinine 0.44 - 1.00 mg/dL 9.32    Edinburgh Score:  03/02/2024    6:17 PM  Edinburgh Postnatal Depression Scale Screening Tool  I have been able to laugh and see the funny side of things. 0  I have looked forward with enjoyment to things. 0  I have blamed myself unnecessarily when things went wrong. 1  I have been anxious or worried for no good reason. 1  I have felt scared or panicky for no good reason. 1  Things have been getting on top of me. 1  I have been so unhappy that I have had difficulty sleeping. 0  I have felt sad or miserable. 0  I have been so unhappy that I have been crying. 1  The thought of harming myself has occurred  to me. 0  Edinburgh Postnatal Depression Scale Total 5   Edinburgh Postnatal Depression Scale Total: 5   After visit meds:  Allergies as of 03/03/2024   No Known Allergies      Medication List     STOP taking these medications    promethazine  25 MG tablet Commonly known as: PHENERGAN    tamsulosin  0.4 MG Caps capsule Commonly known as: FLOMAX        TAKE these medications    ibuprofen  600 MG tablet Commonly known as: ADVIL  Take 1 tablet (600 mg total) by mouth every 6 (six) hours.   oxyCODONE  5 MG immediate release tablet Commonly known as: Oxy IR/ROXICODONE  Take 1-2 tablets (5-10 mg total) by mouth every 4 (four) hours as needed for moderate pain (pain score 4-6). What changed:  how much to take when to take this reasons to take this   pantoprazole  40 MG tablet Commonly known as: Protonix  Take 1 tablet (40 mg total) by mouth daily.   PRENATAL VITAMIN PO Take by mouth.               Discharge Care Instructions  (From admission, onward)           Start     Ordered   03/03/24 0000  Discharge wound care:       Comments: Wash with warm, soapy water . Pat dry, do not scrub.   03/03/24 1303             Discharge home in stable condition Infant Feeding: Bottle and Breast Infant Disposition:home with mother Discharge instruction: per After Visit Summary and Postpartum booklet. Activity: Advance as tolerated. Pelvic rest for 6 weeks.  Diet: routine diet Future Appointments: Future Appointments  Date Time Provider Department Center  05/14/2024 10:30 AM Jackquline Sawyer, MD [facility] None   Follow up Visit:  Follow-up Information     Center for [facility]. Schedule an appointment as soon as possible for a visit in 1 week(s).   Specialty: Obstetrics and Gynecology Why: For wound re-check Contact information: [address] [place]  [address] [phone]                  Please schedule this patient for a In person postpartum visit in 1 week with the following provider: MD. Additional Postpartum F/U:Incision check 1 week  High risk pregnancy complicated by: nephrolithiasis Delivery mode:  C-Section, Low Transverse Anticipated Birth Control:  BTL done PP   03/03/2024 Burnard CHRISTELLA Moats, MD

## 2024-03-05 LAB — SURGICAL PATHOLOGY

## 2024-03-06 ENCOUNTER — Ambulatory Visit: Payer: 59 | Admitting: Dermatology

## 2024-03-08 ENCOUNTER — Ambulatory Visit (INDEPENDENT_AMBULATORY_CARE_PROVIDER_SITE_OTHER): Payer: Self-pay | Admitting: *Deleted

## 2024-03-08 VITALS — BP 139/85 | HR 71 | Ht 67.0 in | Wt 205.0 lb

## 2024-03-08 DIAGNOSIS — Z98891 History of uterine scar from previous surgery: Secondary | ICD-10-CM

## 2024-03-08 NOTE — Progress Notes (Signed)
 Subjective:     Evelyn Caldwell is a 33 y.o. female who presents to the clinic 1 weeks status post low uterine, transverse cesarean section. Pt reports incision is healing well.      Objective:    There were no vitals taken for this visit. General:  alert, well appearing, in no apparent distress  Incision:   healing well, no drainage, no erythema, no hernia, no seroma, no swelling, no dehiscence, incision well approximated     Assessment:    Doing well postoperatively.   Plan:    1. Continue any current medications. 2. Wound care discussed. 3. Follow up: 5 week for 6 week post partum.   Harlene Gander, CMA

## 2024-03-09 ENCOUNTER — Other Ambulatory Visit: Payer: Self-pay

## 2024-03-09 DIAGNOSIS — N200 Calculus of kidney: Secondary | ICD-10-CM

## 2024-03-09 NOTE — Progress Notes (Signed)
 Surgical Physician Order Form [facility] [place]  Dr. Francisca * Scheduling expectation : No sooner than 04/12/2024 (6 weeks postpartum mark)  *Length of Case:   *Clearance needed: no  *Anticoagulation Instructions: N/A  *Aspirin Instructions: N/A  *Post-op visit Date/Instructions:  TBD  *Diagnosis: Right Nephrolithiasis  *Procedure: right Ureteroscopy w/laser lithotripsy & stent placement ([id number])   Additional orders: N/A  -Admit type: OUTpatient  -Anesthesia: General  -VTE Prophylaxis Standing Order SCD's       Other:   -Standing Lab Orders Per Anesthesia    Lab other: UA&Urine Culture  -Standing Test orders EKG/Chest x-ray per Anesthesia       Test other:   - Medications:  Ancef  2gm IV  -Other orders:  N/A

## 2024-03-12 ENCOUNTER — Telehealth: Payer: Self-pay

## 2024-03-12 NOTE — Telephone Encounter (Signed)
 Per Dr. Francisca, Patient is to be scheduled for Right Ureteroscopy with Laser Lithotripsy and Stent Placement   Evelyn Caldwell was contacted and possible surgical dates were discussed, Monday November 17th, 2025 was agreed upon for surgery.   Patient was instructed that Dr. Francisca will require them to provide a pre-op UA & CX prior to surgery. This was ordered and scheduled drop off appointment was made for 04/04/2024.    Patient was directed to call [phone] between 1-3pm the day before surgery to find out surgical arrival time.  Instructions were given not to eat or drink from midnight on the night before surgery and have a driver for the day of surgery. On the surgery day patient was instructed to enter through the Medical Mall entrance of [facility] report the Same Day Surgery desk.   Pre-Admit Testing will be in contact via phone to set up an interview with the anesthesia team to review your history and medications prior to surgery.   Reminder of this information was sent via MyChart to the patient.

## 2024-03-12 NOTE — Progress Notes (Signed)
   [facility] Surgical Posting Form  Surgery Date: Date: 04/16/2024  Surgeon: Dr. Redell Burnet, MD  Inpt ( No  )   Outpt (Yes)   Obs ( No  )   Diagnosis: N20.0 Right Nephrolithiasis  -CPT: [phone]  Surgery: Right Ureteroscopy with Laser Lithotripsy and Stent Placement  Stop Anticoagulations: No  Cardiac/Medical/Pulmonary Clearance needed: no  *Orders entered into EPIC  Date: 03/12/24   *Case booked in [place]  Date: 03/12/24  *Notified pt of Surgery: Date: 03/12/24  PRE-OP UA & CX: yes, will obtain in clinic on 04/04/2024  *Placed into Prior Authorization Work [place] Date: 03/12/24  Assistant/laser/rep:No

## 2024-04-04 ENCOUNTER — Other Ambulatory Visit: Payer: Self-pay

## 2024-04-04 DIAGNOSIS — N200 Calculus of kidney: Secondary | ICD-10-CM

## 2024-04-04 LAB — MICROSCOPIC EXAMINATION

## 2024-04-05 LAB — URINALYSIS, COMPLETE
Bilirubin, UA: NEGATIVE
Glucose, UA: NEGATIVE
Ketones, UA: NEGATIVE
Leukocytes,UA: NEGATIVE
Nitrite, UA: NEGATIVE
Protein,UA: NEGATIVE
Specific Gravity, UA: 1.025 (ref 1.005–1.030)
Urobilinogen, Ur: 0.2 mg/dL (ref 0.2–1.0)
pH, UA: 6 (ref 5.0–7.5)

## 2024-04-05 LAB — MICROSCOPIC EXAMINATION: Epithelial Cells (non renal): >10 /HPF — AB (ref 0–10)

## 2024-04-06 ENCOUNTER — Encounter: Payer: Self-pay | Admitting: Obstetrics & Gynecology

## 2024-04-06 ENCOUNTER — Other Ambulatory Visit: Payer: Self-pay

## 2024-04-06 ENCOUNTER — Encounter
Admission: RE | Admit: 2024-04-06 | Discharge: 2024-04-06 | Disposition: A | Discharge status: Home / Self Care | Payer: Self-pay | Source: Ambulatory Visit | Attending: Urology | Admitting: Urology

## 2024-04-06 VITALS — Ht 67.0 in | Wt 177.0 lb

## 2024-04-06 DIAGNOSIS — Z01818 Encounter for other preprocedural examination: Secondary | ICD-10-CM

## 2024-04-06 DIAGNOSIS — D62 Acute posthemorrhagic anemia: Secondary | ICD-10-CM

## 2024-04-06 NOTE — Patient Instructions (Addendum)
 Your procedure is scheduled on: MONDAY 04/16/24 Report to the Registration Desk on the 1st floor of the Medical Mall. To find out your arrival time, please call [phone] between 1PM - 3PM on: FRIDAY 04/13/24 If your arrival time is 6:00 am, do not arrive before that time as the Medical Mall entrance doors do not open until 6:00 am.  REMEMBER: Instructions that are not followed completely may result in serious medical risk, up to and including death; or upon the discretion of your surgeon and anesthesiologist your surgery may need to be rescheduled.  Do not eat food after midnight the night before surgery.  No gum chewing or hard candies.  You may however, drink WATER  up to 2 hours before you are scheduled to arrive for your surgery. Do not drink anything within 2 hours of your scheduled arrival time.  One week prior to surgery: Stop Anti-inflammatories (NSAIDS) such as Advil , Aleve, Ibuprofen , Motrin , Naproxen, Naprosyn and Aspirin based products such as Excedrin, Goody's Powder, BC Powder.  Stop ANY OVER THE COUNTER supplements until after surgery.  You may however, continue to take Tylenol  if needed for pain up until the day of surgery.  Continue taking all of your other prescription medications up until the day of surgery.  ON THE DAY OF SURGERY ONLY TAKE THESE MEDICATIONS WITH SIPS OF WATER :  acetaminophen  (TYLENOL )   Use inhalers on the day of surgery and bring to the hospital.  No Alcohol for 24 hours before or after surgery.  No Smoking including e-cigarettes for 24 hours before surgery.  No chewable tobacco products for at least 6 hours before surgery.  No nicotine  patches on the day of surgery.  Do not use any recreational drugs for at least a week (preferably 2 weeks) before your surgery.  Please be advised that the combination of cocaine and anesthesia may have negative outcomes, up to and including death. If you test positive for cocaine, your surgery will be  cancelled.  On the morning of surgery brush your teeth with toothpaste and water , you may rinse your mouth with mouthwash if you wish. Do not swallow any toothpaste or mouthwash.  Do not wear jewelry, make-up, hairpins, clips or nail polish.  For welded (permanent) jewelry: bracelets, anklets, waist bands, etc.  Please have this removed prior to surgery.  If it is not removed, there is a chance that hospital personnel will need to cut it off on the day of surgery.  Do not wear lotions, powders, or perfumes.   Do not shave body hair from the neck down 48 hours before surgery.  Contact lenses, hearing aids and dentures may not be worn into surgery.  Do not bring valuables to the hospital. [facility] is not responsible for any missing/lost belongings or valuables.   Notify your doctor if there is any change in your medical condition (cold, fever, infection).  Wear comfortable clothing (specific to your surgery type) to the hospital.  After surgery, you can help prevent lung complications by doing breathing exercises.  Take deep breaths and cough every 1-2 hours. Your doctor may order a device called an Incentive Spirometer to help you take deep breaths.  If you are being discharged the day of surgery, you will not be allowed to drive home. You will need a responsible individual to drive you home and stay with you for 24 hours after surgery.   If you are taking public transportation, you will need to have a responsible individual with you.  Please call the Pre-admissions Testing Dept. at [phone] if you have any questions about these instructions.  Surgery Visitation Policy:  Patients having surgery or a procedure may have two visitors.  Children under the age of 40 must have an adult with them who is not the patient.  Merchandiser, Retail to address health-related social needs:  [email or web address]

## 2024-04-09 ENCOUNTER — Other Ambulatory Visit: Payer: Self-pay | Admitting: *Deleted

## 2024-04-09 MED ORDER — NYSTATIN 100000 UNIT/GM EX CREA: 1.0000 | TOPICAL_CREAM | CUTANEOUS | 1 refills | Status: AC

## 2024-04-10 ENCOUNTER — Other Ambulatory Visit: Payer: Self-pay | Admitting: *Deleted

## 2024-04-10 LAB — CULTURE, URINE COMPREHENSIVE

## 2024-04-10 MED ORDER — FLUCONAZOLE 200 MG PO TABS: 200.0000 mg | ORAL_TABLET | Freq: Every day | ORAL | 1 refills | Status: AC

## 2024-04-11 ENCOUNTER — Encounter: Payer: Self-pay | Admitting: Obstetrics & Gynecology

## 2024-04-11 ENCOUNTER — Ambulatory Visit (INDEPENDENT_AMBULATORY_CARE_PROVIDER_SITE_OTHER): Payer: Self-pay | Admitting: Obstetrics & Gynecology

## 2024-04-11 DIAGNOSIS — R32 Unspecified urinary incontinence: Secondary | ICD-10-CM

## 2024-04-11 NOTE — Progress Notes (Signed)
 Post Partum Visit Note  Evelyn Caldwell is a 33 y.o. [phone] female who presents for a postpartum visit. She is 6 weeks postpartum following a primary cesarean section done for arrest of descent .  I have fully reviewed the prenatal and intrapartum course. The delivery was at 39 gestational weeks.  Anesthesia: epidural. Postpartum course has been well. Baby is doing well. Baby is feeding by breast. Bleeding no bleeding. Bowel function is normal. Bladder function is abnormal: She is experiencing some urinary incontinence, not helped by Kegel exercises. Patient is not sexually active. Contraception method is tubal ligation. Postpartum depression screening: negative.   Edinburgh Postnatal Depression Scale - 04/11/24 1509       Edinburgh Postnatal Depression Scale:  In the Past 7 Days   I have been able to laugh and see the funny side of things. 1    I have looked forward with enjoyment to things. 1    I have blamed myself unnecessarily when things went wrong. 1    I have been anxious or worried for no good reason. 0    I have felt scared or panicky for no good reason. 0    Things have been getting on top of me. 2    I have been so unhappy that I have had difficulty sleeping. 0    I have felt sad or miserable. 1    I have been so unhappy that I have been crying. 1    The thought of harming myself has occurred to me. 0    Edinburgh Postnatal Depression Scale Total 7          Health Maintenance Due  Topic Date Due   Hepatitis B Vaccines 19-59 Average Risk (1 of 3 - 19+ 3-dose series) Never done   HPV VACCINES (1 - 3-dose SCDM series) Never done   COVID-19 Vaccine (1 - 2025-26 season) Never done    The following portions of the patient's history were reviewed and updated as appropriate: allergies, current medications, past family history, past medical history, past social history, past surgical history, and problem list.  Review of Systems Pertinent items noted in HPI and  remainder of comprehensive ROS otherwise negative.  Objective:  BP 119/82   Pulse 69   Ht 5' 7 (1.702 m)   Wt 181 lb 4 oz (82.2 kg)   Breastfeeding Yes   BMI 28.39 kg/m    General:  alert and no distress   Breasts:  not indicated  Lungs: Normal breath sounds, no distress  Heart:  regular rate and rhythm  Abdomen: soft, non-tender; bowel sounds normal; no masses,  no organomegaly   Wound well approximated incision, no concerning issues  GU exam:  not indicated       Assessment:   Normal postpartum exam.   Plan:   Essential components of care per ACOG recommendations:  1.  Mood and well being: Patient with negative depression screening today. Reviewed local resources for support.  - Patient tobacco use? No.   - hx of drug use? No.    2. Infant care and feeding:  -Patient currently breastmilk feeding? Yes. Reviewed importance of draining breast regularly to support lactation.  -Social determinants of health (SDOH) reviewed in EPIC. No concerns.  3. Sexuality, contraception and birth spacing - Patient does not want a pregnancy in the next year.  She had bilateral salpingectomy.  4. Sleep and fatigue -Encouraged family/partner/community support of 4 hrs of uninterrupted sleep to help with  mood and fatigue  5. Physical Recovery  - Discussed patients delivery and complications. - Patient had a C-section failure to progress.  - Patient has urinary incontinence? Yes. Discussed role of pelvic floor PT. Offered PT and patient accepted. Patient was referred to pelvic floor PT.  - Patient is safe to resume physical and sexual activity  6.  Health Maintenance - HM due items addressed Yes - Last pap smear  Diagnosis  Date Value Ref Range Status  10/21/2022   Final   - Negative for intraepithelial lesion or malignancy (NILM)   Pap smear not done at today's visit.  -Breast Cancer screening indicated? No.   7. Chronic Disease/Pregnancy Condition follow up: Nephrolithiasis -  PCP follow up   Gloris Hugger, MD Center for [facility], [facility]

## 2024-04-12 ENCOUNTER — Encounter: Payer: Self-pay | Admitting: Urgent Care

## 2024-04-12 ENCOUNTER — Encounter
Admission: RE | Admit: 2024-04-12 | Discharge: 2024-04-12 | Disposition: A | Discharge status: Home / Self Care | Payer: Self-pay | Source: Ambulatory Visit | Attending: Urology | Admitting: Urology

## 2024-04-12 DIAGNOSIS — D62 Acute posthemorrhagic anemia: Secondary | ICD-10-CM | POA: Insufficient documentation

## 2024-04-12 DIAGNOSIS — Z01812 Encounter for preprocedural laboratory examination: Secondary | ICD-10-CM | POA: Insufficient documentation

## 2024-04-12 DIAGNOSIS — Z01818 Encounter for other preprocedural examination: Secondary | ICD-10-CM

## 2024-04-12 LAB — CBC
HCT: 33.4 % — ABNORMAL LOW (ref 36.0–46.0)
Hemoglobin: 10.4 g/dL — ABNORMAL LOW (ref 12.0–15.0)
MCH: 24.8 pg — ABNORMAL LOW (ref 26.0–34.0)
MCHC: 31.1 g/dL (ref 30.0–36.0)
MCV: 79.7 fL — ABNORMAL LOW (ref 80.0–100.0)
Platelets: 274 K/uL (ref 150–400)
RBC: 4.19 MIL/uL (ref 3.87–5.11)
RDW: 14.5 % (ref 11.5–15.5)
WBC: 5.9 K/uL (ref 4.0–10.5)
nRBC: 0 % (ref 0.0–0.2)

## 2024-04-13 NOTE — Anesthesia Preprocedure Evaluation (Addendum)
 Anesthesia Evaluation  Patient identified by MRN, date of birth, ID band Patient awake    Reviewed: Allergy & Precautions, H&P , NPO status , Patient's Chart, lab work & pertinent test results  Airway Mallampati: II  TM Distance: >3 FB Neck ROM: full    Dental no notable dental hx.    Pulmonary neg pulmonary ROS   Pulmonary exam normal        Cardiovascular negative cardio ROS Normal cardiovascular exam     Neuro/Psych   Anxiety      negative psych ROS   GI/Hepatic Neg liver ROS, hiatal hernia (small 2023),GERD  ,,  Endo/Other  negative endocrine ROS    Renal/GU Renal diseasenephrolithiasis      Musculoskeletal   Abdominal Normal abdominal exam  (+)   Peds  Hematology  (+) Blood dyscrasia, anemia   Anesthesia Other Findings Past Medical History: No date: Abdominal pain, epigastric No date: Acne 03/03/2024: Acute blood loss anemia No date: Anxiety No date: Atypical chest pain No date: Erosive esophagitis No date: GERD (gastroesophageal reflux disease) 01/24/2015: Headache, migraine No date: Hiatal hernia No date: History of kidney stones 08/18/2016: Internal hemorrhoids No date: Kidney stone No date: Migraine headache     Comment:  with season changes 10/22/2015: Nephrolithiasis     Comment:  Renal ultrasound-mild right hydronephrosis likely               consistent with physiologic hydronephrosis of pregnancy;               no obstruction with bilateral ureteral jets seen;               urinalysis notable for 2+ hemoglobin   03/09/2016: Status post vacuum-assisted vaginal delivery No date: Vulvar lesion  Past Surgical History: 03/01/2024: CESAREAN SECTION WITH BILATERAL TUBAL LIGATION; N/A     Comment:  Procedure: CESAREAN SECTION, WITH BILATERAL TUBAL               LIGATION;  Surgeon: Zina Jerilynn LABOR, MD;  Location: [facility];  Service: Obstetrics;  Laterality: N/A; 07/27/2018:  CYSTOSCOPY WITH STENT PLACEMENT; Right     Comment:  Procedure: CYSTOSCOPY WITH STENT PLACEMENT;  Surgeon:               Francisca Redell BROCKS, MD;  Location: [facility] ORS;  Service:               Urology;  Laterality: Right; 08/03/2018: CYSTOSCOPY/URETEROSCOPY/HOLMIUM LASER/STENT PLACEMENT; Right     Comment:  Procedure: CYSTOSCOPY/URETEROSCOPY/HOLMIUM LASER/STENT               EXCHANGE **WITH NST BEFORE AND AFTER**;  Surgeon:               Penne Knee, MD;  Location: [facility] ORS;  Service:               Urology;  Laterality: Right; 03/02/2021: CYSTOSCOPY/URETEROSCOPY/HOLMIUM LASER/STENT PLACEMENT;  Right     Comment:  Procedure: CYSTOSCOPY/URETEROSCOPY/HOLMIUM LASER/STENT               PLACEMENT;  Surgeon: Penne Knee, MD;  Location: [facility]              ORS;  Service: Urology;  Laterality: Right; 02/18/2022: ESOPHAGOGASTRODUODENOSCOPY (EGD) WITH PROPOFOL ; N/A     Comment:  Procedure: ESOPHAGOGASTRODUODENOSCOPY (EGD) WITH               PROPOFOL ;  Surgeon: Unk Corinn Skiff, MD;  Location:               [facility];  Service: Endoscopy;  Laterality:               N/A; 02/15/2019: EXTRACORPOREAL SHOCK WAVE LITHOTRIPSY; Left     Comment:  Procedure: EXTRACORPOREAL SHOCK WAVE LITHOTRIPSY (ESWL);              Surgeon: Penne Knee, MD;  Location: [facility] ORS;                Service: Urology;  Laterality: Left; No date: none     Reproductive/Obstetrics negative OB ROS                              Anesthesia Physical Anesthesia Plan  ASA: 2  Anesthesia Plan: General ETT   Post-op Pain Management: Tylenol  PO (pre-op)* and Celebrex PO (pre-op)*   Induction: Intravenous  PONV Risk Score and Plan: 2 and Ondansetron , Dexamethasone  and Midazolam   Airway Management Planned: Oral ETT  Additional Equipment:   Intra-op Plan:   Post-operative Plan: Extubation in OR  Informed Consent: I have reviewed the patients History and Physical, chart, labs and discussed  the procedure including the risks, benefits and alternatives for the proposed anesthesia with the patient or authorized representative who has indicated his/her understanding and acceptance.     Dental Advisory Given  Plan Discussed with: CRNA and Surgeon  Anesthesia Plan Comments:          Anesthesia Quick Evaluation

## 2024-04-16 ENCOUNTER — Other Ambulatory Visit: Payer: Self-pay

## 2024-04-16 ENCOUNTER — Encounter: Payer: Self-pay | Admitting: Urology

## 2024-04-16 ENCOUNTER — Ambulatory Visit: Payer: Self-pay | Admitting: Anesthesiology

## 2024-04-16 ENCOUNTER — Ambulatory Visit: Payer: Self-pay

## 2024-04-16 ENCOUNTER — Ambulatory Visit
Admission: RE | Admit: 2024-04-16 | Discharge: 2024-04-16 | Disposition: A | Discharge status: Home / Self Care | Payer: Self-pay | Attending: Urology | Admitting: Urology

## 2024-04-16 ENCOUNTER — Encounter
Admission: RE | Disposition: A | Discharge status: Home / Self Care | Payer: Self-pay | Source: Home / Self Care | Attending: Urology

## 2024-04-16 DIAGNOSIS — Z01818 Encounter for other preprocedural examination: Secondary | ICD-10-CM

## 2024-04-16 DIAGNOSIS — K219 Gastro-esophageal reflux disease without esophagitis: Secondary | ICD-10-CM | POA: Insufficient documentation

## 2024-04-16 DIAGNOSIS — N200 Calculus of kidney: Secondary | ICD-10-CM

## 2024-04-16 HISTORY — PX: CYSTOSCOPY/URETEROSCOPY/HOLMIUM LASER/STENT PLACEMENT: SHX6546

## 2024-04-16 LAB — POCT PREGNANCY, URINE: Preg Test, Ur: NEGATIVE

## 2024-04-16 SURGERY — CYSTOSCOPY/URETEROSCOPY/HOLMIUM LASER/STENT PLACEMENT
Anesthesia: General | Site: Ureter | Laterality: Right

## 2024-04-16 MED ORDER — OXYCODONE HCL 5 MG/5ML PO SOLN: 5.0000 mg | Freq: Once | ORAL | Status: DC | PRN

## 2024-04-16 MED ORDER — TRAMADOL HCL 50 MG PO TABS: 25.0000 mg | ORAL_TABLET | Freq: Four times a day (QID) | ORAL | 0 refills | Status: AC | PRN

## 2024-04-16 MED ORDER — MIDAZOLAM HCL (PF) 2 MG/2ML IJ SOLN
INTRAMUSCULAR | Status: DC | PRN
Start: 2024-04-16 — End: 2024-04-16
  Administered 2024-04-16: 2 mg via INTRAVENOUS

## 2024-04-16 MED ORDER — CEPHALEXIN 500 MG PO CAPS: 500.0000 mg | ORAL_CAPSULE | Freq: Once | ORAL | 0 refills | Status: AC | PRN

## 2024-04-16 MED ORDER — DROPERIDOL 2.5 MG/ML IJ SOLN: 0.6250 mg | Freq: Once | INTRAMUSCULAR | Status: DC | PRN

## 2024-04-16 MED ORDER — FENTANYL CITRATE (PF) 100 MCG/2ML IJ SOLN
INTRAMUSCULAR | Status: AC
  Filled 2024-04-16: qty 2

## 2024-04-16 MED ORDER — SUGAMMADEX SODIUM 200 MG/2ML IV SOLN
INTRAVENOUS | Status: DC | PRN
  Administered 2024-04-16: 200 mg via INTRAVENOUS

## 2024-04-16 MED ORDER — STERILE WATER FOR IRRIGATION IR SOLN
Status: DC | PRN
  Administered 2024-04-16: 500 mL

## 2024-04-16 MED ORDER — CHLORHEXIDINE GLUCONATE 0.12 % MT SOLN
15.0000 mL | Freq: Once | OROMUCOSAL | Status: AC
  Administered 2024-04-16: 15 mL via OROMUCOSAL

## 2024-04-16 MED ORDER — OXYCODONE HCL 5 MG PO TABS: 5.0000 mg | ORAL_TABLET | Freq: Once | ORAL | Status: DC | PRN

## 2024-04-16 MED ORDER — LACTATED RINGERS IV SOLN: INTRAVENOUS | Status: DC

## 2024-04-16 MED ORDER — SODIUM CHLORIDE 0.9 % IR SOLN
Status: DC | PRN
Start: 2024-04-16 — End: 2024-04-16
  Administered 2024-04-16: 3000 mL

## 2024-04-16 MED ORDER — ROCURONIUM BROMIDE 100 MG/10ML IV SOLN
INTRAVENOUS | Status: DC | PRN
  Administered 2024-04-16: 50 mg via INTRAVENOUS

## 2024-04-16 MED ORDER — ACETAMINOPHEN 500 MG PO TABS
1000.0000 mg | ORAL_TABLET | Freq: Once | ORAL | Status: AC
  Administered 2024-04-16: 1000 mg via ORAL

## 2024-04-16 MED ORDER — IOHEXOL 180 MG/ML  SOLN
INTRAMUSCULAR | Status: DC | PRN
  Administered 2024-04-16: 10 mL

## 2024-04-16 MED ORDER — LIDOCAINE HCL (CARDIAC) PF 100 MG/5ML IV SOSY
PREFILLED_SYRINGE | INTRAVENOUS | Status: DC | PRN
  Administered 2024-04-16: 100 mg via INTRAVENOUS

## 2024-04-16 MED ORDER — FENTANYL CITRATE (PF) 100 MCG/2ML IJ SOLN: 25.0000 ug | INTRAMUSCULAR | Status: DC | PRN

## 2024-04-16 MED ORDER — CELECOXIB 200 MG PO CAPS
200.0000 mg | ORAL_CAPSULE | Freq: Once | ORAL | Status: AC
  Administered 2024-04-16: 200 mg via ORAL

## 2024-04-16 MED ORDER — CHLORHEXIDINE GLUCONATE 0.12 % MT SOLN
OROMUCOSAL | Status: AC
Start: 2024-04-16 — End: 2024-04-16
  Filled 2024-04-16: qty 15

## 2024-04-16 MED ORDER — PROPOFOL 10 MG/ML IV BOLUS
INTRAVENOUS | Status: DC | PRN
  Administered 2024-04-16: 150 mg via INTRAVENOUS

## 2024-04-16 MED ORDER — CEFAZOLIN SODIUM-DEXTROSE 2-4 GM/100ML-% IV SOLN
2.0000 g | INTRAVENOUS | Status: AC
  Administered 2024-04-16: 2 g via INTRAVENOUS

## 2024-04-16 MED ORDER — FENTANYL CITRATE (PF) 100 MCG/2ML IJ SOLN
INTRAMUSCULAR | Status: DC | PRN
  Administered 2024-04-16: 50 ug via INTRAVENOUS

## 2024-04-16 MED ORDER — ACETAMINOPHEN 10 MG/ML IV SOLN: 1000.0000 mg | Freq: Once | INTRAVENOUS | Status: DC | PRN

## 2024-04-16 MED ORDER — ONDANSETRON HCL 4 MG/2ML IJ SOLN
INTRAMUSCULAR | Status: DC | PRN
  Administered 2024-04-16 (×2): 4 mg via INTRAVENOUS

## 2024-04-16 MED ORDER — ORAL CARE MOUTH RINSE: 15.0000 mL | Freq: Once | OROMUCOSAL | Status: AC

## 2024-04-16 MED ORDER — DEXAMETHASONE SOD PHOSPHATE PF 10 MG/ML IJ SOLN
INTRAMUSCULAR | Status: DC | PRN
  Administered 2024-04-16: 10 mg via INTRAVENOUS

## 2024-04-16 MED ORDER — ACETAMINOPHEN 500 MG PO TABS
ORAL_TABLET | ORAL | Status: AC
Start: 2024-04-16 — End: 2024-04-16
  Filled 2024-04-16: qty 2

## 2024-04-16 MED ORDER — GLYCOPYRROLATE 0.2 MG/ML IJ SOLN
INTRAMUSCULAR | Status: DC | PRN
Start: 2024-04-16 — End: 2024-04-16
  Administered 2024-04-16: .2 mg via INTRAVENOUS

## 2024-04-16 MED ORDER — CELECOXIB 200 MG PO CAPS
ORAL_CAPSULE | ORAL | Status: AC
  Filled 2024-04-16: qty 1

## 2024-04-16 MED ORDER — CEFAZOLIN SODIUM-DEXTROSE 2-4 GM/100ML-% IV SOLN
INTRAVENOUS | Status: AC
  Filled 2024-04-16: qty 100

## 2024-04-16 MED ORDER — MIDAZOLAM HCL 2 MG/2ML IJ SOLN
INTRAMUSCULAR | Status: AC
  Filled 2024-04-16: qty 2

## 2024-04-16 SURGICAL SUPPLY — 23 items
ADHESIVE MASTISOL STRL (MISCELLANEOUS) IMPLANT
BAG DRAIN SIEMENS DORNER NS (MISCELLANEOUS) ×1 IMPLANT
BRUSH SCRUB EZ 4% CHG (MISCELLANEOUS) ×1 IMPLANT
CATH URET FLEX-TIP 2 LUMEN 10F (CATHETERS) IMPLANT
CATH URETL OPEN 5X70 (CATHETERS) IMPLANT
DRAPE UTILITY 15X26 TOWEL STRL (DRAPES) ×1 IMPLANT
DRSG TEGADERM 2-3/8X2-3/4 SM (GAUZE/BANDAGES/DRESSINGS) IMPLANT
FIBER LASER MOSES 365 DFL (Laser) IMPLANT
GLOVE BIOGEL PI IND STRL 7.5 (GLOVE) ×1 IMPLANT
GOWN STRL REUS W/ TWL LRG LVL3 (GOWN DISPOSABLE) ×1 IMPLANT
GOWN STRL REUS W/ TWL XL LVL3 (GOWN DISPOSABLE) ×1 IMPLANT
GUIDEWIRE STR DUAL SENSOR (WIRE) ×1 IMPLANT
KIT TURNOVER CYSTO (KITS) ×1 IMPLANT
PACK CYSTO AR (MISCELLANEOUS) ×1 IMPLANT
SET CYSTO IRRIGATION (SET/KITS/TRAYS/PACK) ×1 IMPLANT
SHEATH NAVIGATOR HD 12/14X36 (SHEATH) IMPLANT
SOL .9 NS 3000ML IRR UROMATIC (IV SOLUTION) ×1 IMPLANT
STENT URET 6FRX24 CONTOUR (STENTS) IMPLANT
STENT URET 6FRX26 CONTOUR (STENTS) IMPLANT
SURGILUBE 2OZ TUBE FLIPTOP (MISCELLANEOUS) ×1 IMPLANT
SYR 10ML LL (SYRINGE) ×1 IMPLANT
VALVE UROSEAL ADJ ENDO (VALVE) IMPLANT
WATER STERILE IRR 500ML POUR (IV SOLUTION) ×1 IMPLANT

## 2024-04-16 NOTE — Anesthesia Procedure Notes (Addendum)
 Procedure Name: Intubation Date/Time: 04/16/2024 12:30 PM  Performed by: Ledora Duncan, CRNAPre-anesthesia Checklist: Patient identified, Emergency Drugs available, Suction available and Patient being monitored Patient Re-evaluated:Patient Re-evaluated prior to induction Oxygen Delivery Method: Circle system utilized Preoxygenation: Pre-oxygenation with 100% oxygen Induction Type: IV induction Ventilation: Mask ventilation without difficulty Laryngoscope Size: McGrath and 3 Grade View: Grade I Tube type: Oral Tube size: 6.5 mm Number of attempts: 1 Airway Equipment and Method: Stylet Placement Confirmation: ETT inserted through vocal cords under direct vision, positive ETCO2 and breath sounds checked- equal and bilateral Secured at: 21 cm Tube secured with: Tape Dental Injury: Teeth and Oropharynx as per pre-operative assessment

## 2024-04-16 NOTE — H&P (Signed)
 04/16/24 12:12 PM   Evelyn Caldwell [facility] Apr 28, 1991 [id number]  CC: Right renal stones  HPI: 33 year old female with right-sided renal stones, has opted for definitive management with ureteroscopy.   PMH: Past Medical History:  Diagnosis Date   Abdominal pain, epigastric    Acne    Acute blood loss anemia 03/03/2024   Anxiety    Atypical chest pain    Erosive esophagitis    GERD (gastroesophageal reflux disease)    Headache, migraine 01/24/2015   Hiatal hernia    History of kidney stones    Internal hemorrhoids 08/18/2016   Kidney stone    Migraine headache    with season changes   Nephrolithiasis 10/22/2015   Renal ultrasound-mild right hydronephrosis likely consistent with physiologic hydronephrosis of pregnancy; no obstruction with bilateral ureteral jets seen; urinalysis notable for 2+ hemoglobin     Status post vacuum-assisted vaginal delivery 03/09/2016   Vulvar lesion     Surgical History: Past Surgical History:  Procedure Laterality Date   CESAREAN SECTION WITH BILATERAL TUBAL LIGATION N/A 03/01/2024   Procedure: CESAREAN SECTION, WITH BILATERAL TUBAL LIGATION;  Surgeon: Zina Jerilynn LABOR, MD;  Location: [facility];  Service: Obstetrics;  Laterality: N/A;   CYSTOSCOPY WITH STENT PLACEMENT Right 07/27/2018   Procedure: CYSTOSCOPY WITH STENT PLACEMENT;  Surgeon: Francisca Redell BROCKS, MD;  Location: [facility];  Service: Urology;  Laterality: Right;   CYSTOSCOPY/URETEROSCOPY/HOLMIUM LASER/STENT PLACEMENT Right 08/03/2018   Procedure: CYSTOSCOPY/URETEROSCOPY/HOLMIUM LASER/STENT EXCHANGE **WITH NST BEFORE AND AFTER**;  Surgeon: Penne Knee, MD;  Location: [facility];  Service: Urology;  Laterality: Right;   CYSTOSCOPY/URETEROSCOPY/HOLMIUM LASER/STENT PLACEMENT Right 03/02/2021   Procedure: CYSTOSCOPY/URETEROSCOPY/HOLMIUM LASER/STENT PLACEMENT;  Surgeon: Penne Knee, MD;  Location: [facility];  Service: Urology;  Laterality: Right;   ESOPHAGOGASTRODUODENOSCOPY (EGD) WITH  PROPOFOL  N/A 02/18/2022   Procedure: ESOPHAGOGASTRODUODENOSCOPY (EGD) WITH PROPOFOL ;  Surgeon: Unk Corinn Skiff, MD;  Location: [facility];  Service: Endoscopy;  Laterality: N/A;   EXTRACORPOREAL SHOCK WAVE LITHOTRIPSY Left 02/15/2019   Procedure: EXTRACORPOREAL SHOCK WAVE LITHOTRIPSY (ESWL);  Surgeon: Penne Knee, MD;  Location: [facility];  Service: Urology;  Laterality: Left;   none        Family History: Family History  Problem Relation Age of Onset   Alcohol abuse Father    Congestive Heart Failure Maternal Grandfather    Parkinson's disease Paternal Grandmother    Kidney cancer Neg Hx    Prostate cancer Neg Hx    Breast cancer Neg Hx    Ovarian cancer Neg Hx    Colon cancer Neg Hx     Social History:  reports that she has never smoked. She has never used smokeless tobacco. She reports that she does not currently use alcohol. She reports that she does not use drugs.  Physical Exam: BP (!) 144/104   Pulse 78   Temp (!) 97.3 F (36.3 C) (Temporal)   Resp 18   SpO2 99%    Constitutional:  Alert and oriented, No acute distress. Cardiovascular: Regular rate and rhythm Respiratory: Clear to auscultation bilaterally GI: Abdomen is soft, nontender, nondistended, no abdominal masses  Laboratory Data: 04/04/2024 low growth of mixed flora  Pertinent Imaging: I have personally viewed and interpreted the CT from 02/04/2024 with nonobstructing right-sided renal stones, physiologic right hydro.  Assessment & Plan:   33 year old female with nonobstructive right renal stones and opted for ureteroscopy for definitive management.  We discussed other options like surveillance and she preferred definitive management with ureteroscopy.  We specifically discussed  the risks ureteroscopy including bleeding, infection/sepsis, stent related symptoms including flank pain/urgency/frequency/incontinence/dysuria, ureteral injury, ureteral stricture, inability to access stone, or need for  staged or additional procedures.  Right ureteroscopy, laser lithotripsy, stent placement   Redell Burnet, MD 04/16/2024  [facility] [address], [address] [place], [place] [address] [phone]

## 2024-04-16 NOTE — Op Note (Signed)
 Date of procedure: 04/16/24  Preoperative diagnosis:  Right renal stones  Postoperative diagnosis:  Same  Procedure: Cystoscopy, right ureteroscopy, laser lithotripsy of renal stones, right retrograde pyelogram with intraoperative interpretation, right ureteral stent placement  Surgeon: Redell Burnet, MD  Anesthesia: General  Complications: None  Intraoperative findings:  Normal cystoscopy, uncomplicated dusting of all right-sided renal stones Stent placed with Dangler  EBL: Minimal  Specimens: None  Drains: Right 6 French by 26 cm ureteral stent with Dangler  Indication: Evelyn Caldwell is a 33 y.o. patient with moderate right sided renal stone burden who opted for definitive treatment with ureteroscopy.  After reviewing the management options for treatment, they elected to proceed with the above surgical procedure(s). We have discussed the potential benefits and risks of the procedure, side effects of the proposed treatment, the likelihood of the patient achieving the goals of the procedure, and any potential problems that might occur during the procedure or recuperation. Informed consent has been obtained.  Description of procedure:  The patient was taken to the operating room and general anesthesia was induced. SCDs were placed for DVT prophylaxis.. The patient was placed in the dorsal lithotomy position, prepped and draped in the usual sterile fashion, and preoperative antibiotics were administered. A preoperative time-out was performed.   A 21 French rigid cystoscope was used to intubate the urethra and thorough cystoscopy was performed.  The bladder was grossly normal throughout.  A sensor wire passed easily into the right ureteral orifice and up to the kidney under fluoroscopic vision.  A digital single-channel flexible ureteroscope advanced easily over the wire under direct vision up to the kidney.  Thorough pyeloscopy showed multiple right renal stones, the largest  measuring approximately 7 mm.  A 365 m laser fiber on settings of 0.5 J and 80 Hz was used to methodically dust all renal stones.  Thorough pyeloscopy showed no stones larger than the laser fiber after dusting.  Retrograde pyelogram performed from the proximal ureter showed no extravasation or filling defects.  A wire was replaced through the scope.  Careful pullback ureteroscopy showed no other ureteral abnormalities.  A 6 French by 26 cm ureteral stent was placed fluoroscopically with a curl in the kidney as well as in the bladder, the bladder was drained with the sheath and this concluded our procedure.  Stent was secured to the groin with Mastisol and Tegaderm.  Disposition: Stable to PACU  Plan: Can remove stent at home on Friday morning RTC with PA 6 months KUB prior  Redell Burnet, MD

## 2024-04-16 NOTE — Transfer of Care (Signed)
 Immediate Anesthesia Transfer of Care Note  Patient: Evelyn Caldwell  Procedure(s) Performed: CYSTOSCOPY/URETEROSCOPY/HOLMIUM LASER/STENT PLACEMENT (Right: Ureter)  Patient Location: PACU  Anesthesia Type:General  Level of Consciousness: awake, drowsy, and patient cooperative  Airway & Oxygen Therapy: Patient Spontanous Breathing and Patient connected to face mask oxygen  Post-op Assessment: Report given to RN and Post -op Vital signs reviewed and stable  Post vital signs: Reviewed and stable  Last Vitals:  Vitals Value Taken Time  BP 118/56 04/16/24 13:06  Temp 36.2 C 04/16/24 13:07  Pulse 73 04/16/24 13:09  Resp 16 04/16/24 13:09  SpO2 96 % 04/16/24 13:09  Vitals shown include unfiled device data.  Last Pain:  Vitals:   04/16/24 1116  TempSrc: Temporal  PainSc: 0-No pain         Complications: No notable events documented.

## 2024-04-17 ENCOUNTER — Encounter: Payer: Self-pay | Admitting: Urology

## 2024-04-19 NOTE — Anesthesia Postprocedure Evaluation (Signed)
 Anesthesia Post Note  Patient: Evelyn Caldwell  Procedure(s) Performed: CYSTOSCOPY/URETEROSCOPY/HOLMIUM LASER/STENT PLACEMENT (Right: Ureter)  Patient location during evaluation: PACU Anesthesia Type: General Level of consciousness: awake and alert Pain management: pain level controlled Vital Signs Assessment: post-procedure vital signs reviewed and stable Respiratory status: spontaneous breathing, nonlabored ventilation and respiratory function stable Cardiovascular status: blood pressure returned to baseline and stable Postop Assessment: no apparent nausea or vomiting Anesthetic complications: no   No notable events documented.   Last Vitals:  Vitals:   04/16/24 1350 04/16/24 1405  BP:  135/80  Pulse: (!) 46 (!) 49  Resp: (!) 8 14  Temp:  (!) 36.3 C  SpO2: 100% 100%    Last Pain:  Vitals:   04/16/24 1405  TempSrc: Temporal  PainSc: 0-No pain                 Camellia Merilee Louder

## 2024-05-02 ENCOUNTER — Other Ambulatory Visit: Payer: Self-pay | Admitting: Student

## 2024-05-14 ENCOUNTER — Ambulatory Visit: Payer: 59 | Admitting: Dermatology

## 2024-05-29 ENCOUNTER — Ambulatory Visit: Payer: Self-pay | Admitting: Cardiology

## 2024-06-05 ENCOUNTER — Ambulatory Visit: Payer: Self-pay | Admitting: Urology

## 2024-06-05 DIAGNOSIS — R310 Gross hematuria: Secondary | ICD-10-CM

## 2024-06-27 ENCOUNTER — Ambulatory Visit: Admitting: Dermatology

## 2024-10-02 ENCOUNTER — Ambulatory Visit: Admitting: Dermatology

## 2024-10-15 ENCOUNTER — Ambulatory Visit: Payer: Self-pay | Admitting: Physician Assistant
